# Patient Record
Sex: Female | Born: 1950 | Race: White | Hispanic: No | Smoking: Never smoker
Health system: Southern US, Community
[De-identification: ages and names within clinical notes are randomized; demographics above are authoritative.]

## PROBLEM LIST (undated history)

## (undated) DIAGNOSIS — N200 Calculus of kidney: Secondary | ICD-10-CM

## (undated) DIAGNOSIS — J189 Pneumonia, unspecified organism: Secondary | ICD-10-CM

## (undated) DIAGNOSIS — K635 Polyp of colon: Secondary | ICD-10-CM

## (undated) DIAGNOSIS — G709 Myoneural disorder, unspecified: Secondary | ICD-10-CM

## (undated) DIAGNOSIS — E785 Hyperlipidemia, unspecified: Secondary | ICD-10-CM

## (undated) DIAGNOSIS — M1990 Unspecified osteoarthritis, unspecified site: Secondary | ICD-10-CM

## (undated) DIAGNOSIS — I10 Essential (primary) hypertension: Secondary | ICD-10-CM

## (undated) DIAGNOSIS — C50919 Malignant neoplasm of unspecified site of unspecified female breast: Secondary | ICD-10-CM

## (undated) DIAGNOSIS — Z923 Personal history of irradiation: Secondary | ICD-10-CM

## (undated) DIAGNOSIS — Z9221 Personal history of antineoplastic chemotherapy: Secondary | ICD-10-CM

## (undated) HISTORY — DX: Myoneural disorder, unspecified: G70.9

## (undated) HISTORY — PX: MASTECTOMY: SHX3

## (undated) HISTORY — DX: Malignant neoplasm of unspecified site of unspecified female breast: C50.919

## (undated) HISTORY — DX: Hyperlipidemia, unspecified: E78.5

## (undated) HISTORY — DX: Calculus of kidney: N20.0

## (undated) HISTORY — DX: Pneumonia, unspecified organism: J18.9

## (undated) HISTORY — DX: Polyp of colon: K63.5

## (undated) HISTORY — PX: ABDOMINAL HYSTERECTOMY: SHX81

## (undated) HISTORY — DX: Unspecified osteoarthritis, unspecified site: M19.90

## (undated) HISTORY — DX: Essential (primary) hypertension: I10

---

## 1984-07-15 HISTORY — PX: PARTIAL HYSTERECTOMY: SHX80

## 1984-07-15 HISTORY — PX: ABDOMINAL HYSTERECTOMY: SHX81

## 1989-07-15 HISTORY — PX: OTHER SURGICAL HISTORY: SHX169

## 1997-07-15 HISTORY — PX: BREAST LUMPECTOMY: SHX2

## 1998-05-03 ENCOUNTER — Ambulatory Visit (HOSPITAL_BASED_OUTPATIENT_CLINIC_OR_DEPARTMENT_OTHER): Admission: RE | Admit: 1998-05-03 | Discharge: 1998-05-03 | Payer: Self-pay | Admitting: General Surgery

## 1998-05-10 ENCOUNTER — Encounter: Payer: Self-pay | Admitting: General Surgery

## 1998-05-11 ENCOUNTER — Encounter: Payer: Self-pay | Admitting: General Surgery

## 1998-05-11 ENCOUNTER — Ambulatory Visit (HOSPITAL_COMMUNITY): Admission: RE | Admit: 1998-05-11 | Discharge: 1998-05-11 | Payer: Self-pay | Admitting: General Surgery

## 1998-05-23 ENCOUNTER — Ambulatory Visit (HOSPITAL_BASED_OUTPATIENT_CLINIC_OR_DEPARTMENT_OTHER): Admission: RE | Admit: 1998-05-23 | Discharge: 1998-05-23 | Payer: Self-pay | Admitting: General Surgery

## 1998-06-15 ENCOUNTER — Encounter: Payer: Self-pay | Admitting: General Surgery

## 1998-06-15 ENCOUNTER — Ambulatory Visit (HOSPITAL_COMMUNITY): Admission: RE | Admit: 1998-06-15 | Discharge: 1998-06-15 | Payer: Self-pay | Admitting: General Surgery

## 1998-10-18 ENCOUNTER — Encounter: Admission: RE | Admit: 1998-10-18 | Discharge: 1999-01-16 | Payer: Self-pay | Admitting: Radiation Oncology

## 1999-01-18 ENCOUNTER — Encounter: Admission: RE | Admit: 1999-01-18 | Discharge: 1999-04-18 | Payer: Self-pay | Admitting: Radiation Oncology

## 1999-04-09 ENCOUNTER — Other Ambulatory Visit: Admission: RE | Admit: 1999-04-09 | Discharge: 1999-04-09 | Payer: Self-pay | Admitting: Obstetrics and Gynecology

## 1999-06-22 ENCOUNTER — Encounter: Admission: RE | Admit: 1999-06-22 | Discharge: 1999-06-22 | Payer: Self-pay | Admitting: General Surgery

## 1999-06-22 ENCOUNTER — Encounter: Payer: Self-pay | Admitting: General Surgery

## 1999-07-27 ENCOUNTER — Other Ambulatory Visit: Admission: RE | Admit: 1999-07-27 | Discharge: 1999-07-27 | Payer: Self-pay | Admitting: Internal Medicine

## 2000-04-15 ENCOUNTER — Encounter: Admission: RE | Admit: 2000-04-15 | Discharge: 2000-04-15 | Payer: Self-pay | Admitting: Oncology

## 2000-04-15 ENCOUNTER — Encounter (HOSPITAL_COMMUNITY): Payer: Self-pay | Admitting: Oncology

## 2000-06-23 ENCOUNTER — Encounter (HOSPITAL_COMMUNITY): Payer: Self-pay | Admitting: Oncology

## 2000-06-23 ENCOUNTER — Encounter: Admission: RE | Admit: 2000-06-23 | Discharge: 2000-06-23 | Payer: Self-pay | Admitting: Oncology

## 2000-10-27 ENCOUNTER — Other Ambulatory Visit: Admission: RE | Admit: 2000-10-27 | Discharge: 2000-10-27 | Payer: Self-pay | Admitting: Obstetrics and Gynecology

## 2001-02-09 ENCOUNTER — Ambulatory Visit (HOSPITAL_COMMUNITY): Admission: RE | Admit: 2001-02-09 | Discharge: 2001-02-09 | Payer: Self-pay | Admitting: Pulmonary Disease

## 2001-02-11 ENCOUNTER — Ambulatory Visit (HOSPITAL_COMMUNITY): Admission: RE | Admit: 2001-02-11 | Discharge: 2001-02-11 | Payer: Self-pay | Admitting: Pulmonary Disease

## 2001-03-06 ENCOUNTER — Encounter: Admission: RE | Admit: 2001-03-06 | Discharge: 2001-03-06 | Payer: Self-pay | Admitting: Oncology

## 2001-03-06 ENCOUNTER — Encounter (HOSPITAL_COMMUNITY): Admission: RE | Admit: 2001-03-06 | Discharge: 2001-04-05 | Payer: Self-pay | Admitting: Oncology

## 2001-06-24 ENCOUNTER — Encounter (HOSPITAL_COMMUNITY): Admission: RE | Admit: 2001-06-24 | Discharge: 2001-07-24 | Payer: Self-pay | Admitting: Oncology

## 2001-06-26 ENCOUNTER — Encounter: Admission: RE | Admit: 2001-06-26 | Discharge: 2001-06-26 | Payer: Self-pay | Admitting: Oncology

## 2001-06-26 ENCOUNTER — Encounter (HOSPITAL_COMMUNITY): Payer: Self-pay | Admitting: Oncology

## 2001-08-24 ENCOUNTER — Encounter (HOSPITAL_COMMUNITY): Admission: RE | Admit: 2001-08-24 | Discharge: 2001-09-23 | Payer: Self-pay | Admitting: Oncology

## 2002-02-22 ENCOUNTER — Encounter (HOSPITAL_COMMUNITY): Admission: RE | Admit: 2002-02-22 | Discharge: 2002-03-24 | Payer: Self-pay | Admitting: Oncology

## 2002-02-22 ENCOUNTER — Encounter: Admission: RE | Admit: 2002-02-22 | Discharge: 2002-02-22 | Payer: Self-pay | Admitting: Oncology

## 2002-08-23 ENCOUNTER — Encounter: Admission: RE | Admit: 2002-08-23 | Discharge: 2002-08-23 | Payer: Self-pay | Admitting: Oncology

## 2002-08-23 ENCOUNTER — Encounter (HOSPITAL_COMMUNITY): Payer: Self-pay | Admitting: Oncology

## 2002-08-23 ENCOUNTER — Encounter (HOSPITAL_COMMUNITY): Admission: RE | Admit: 2002-08-23 | Discharge: 2002-09-22 | Payer: Self-pay | Admitting: Oncology

## 2012-07-15 HISTORY — PX: BREAST SURGERY: SHX581

## 2012-10-06 ENCOUNTER — Telehealth (HOSPITAL_COMMUNITY): Payer: Self-pay | Admitting: *Deleted

## 2012-10-06 NOTE — Telephone Encounter (Signed)
Patient had lobular ca in 2000, seen by Dr. Mariel Sleet, chemo and radiation. She now lives in [place]. 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 Has seen surgeon who is discussing mastectomy and IROT(20 grey of radiation during surgery before closing surgical site. Would like Dr. Thornton Papas thoughts on treatment in her situation.Cheyenne Sanders phone is [phone] Home--[phone]

## 2014-07-15 HISTORY — PX: BREAST BIOPSY: SHX20

## 2015-06-28 DIAGNOSIS — R072 Precordial pain: Secondary | ICD-10-CM | POA: Insufficient documentation

## 2015-06-28 DIAGNOSIS — I10 Essential (primary) hypertension: Secondary | ICD-10-CM | POA: Insufficient documentation

## 2015-06-28 DIAGNOSIS — I2510 Atherosclerotic heart disease of native coronary artery without angina pectoris: Secondary | ICD-10-CM

## 2015-06-28 DIAGNOSIS — E782 Mixed hyperlipidemia: Secondary | ICD-10-CM | POA: Insufficient documentation

## 2015-06-28 HISTORY — DX: Precordial pain: R07.2

## 2015-06-28 HISTORY — DX: Mixed hyperlipidemia: E78.2

## 2015-06-28 HISTORY — DX: Atherosclerotic heart disease of native coronary artery without angina pectoris: I25.10

## 2016-07-25 ENCOUNTER — Ambulatory Visit (INDEPENDENT_AMBULATORY_CARE_PROVIDER_SITE_OTHER): Payer: BLUE CROSS/BLUE SHIELD | Admitting: Family Medicine

## 2016-07-25 VITALS — BP 140/86 | HR 73 | Temp 98.4°F | Resp 18 | Ht 64.5 in | Wt 123.0 lb

## 2016-07-25 DIAGNOSIS — J0120 Acute ethmoidal sinusitis, unspecified: Secondary | ICD-10-CM

## 2016-07-25 DIAGNOSIS — R062 Wheezing: Secondary | ICD-10-CM | POA: Diagnosis not present

## 2016-07-25 MED ORDER — AZITHROMYCIN 250 MG PO TABS: ORAL_TABLET | ORAL | 0 refills | Status: DC

## 2016-07-25 MED ORDER — ALBUTEROL SULFATE HFA 108 (90 BASE) MCG/ACT IN AERS: 2.0000 | INHALATION_SPRAY | Freq: Four times a day (QID) | RESPIRATORY_TRACT | 0 refills | Status: DC | PRN

## 2016-07-25 NOTE — Patient Instructions (Addendum)
For the azithromycin, take 2 pills today and then 1 pill daily after that.    Use the inhaler 2 puff before bed and again if you have trouble breathing.  You should be better in the next few days.  It was good to meet you today!     IF you received an x-ray today, you will receive an invoice from [facility]. Please contact [facility] at [phone] with questions or concerns regarding your invoice.   IF you received labwork today, you will receive an invoice from [facility]. Please contact [facility] at [phone] with questions or concerns regarding your invoice.   Our billing staff will not be able to assist you with questions regarding bills from these companies.  You will be contacted with the lab results as soon as they are available. The fastest way to get your results is to activate your My Chart account. Instructions are located on the last page of this paperwork. If you have not heard from [place] regarding the results in 2 weeks, please contact this office.

## 2016-07-25 NOTE — Progress Notes (Signed)
   Cheyenne Sanders is a 66 y.o. female who presents to [facility] today for URI and wheezing:  1.  SUBJECTIVE:  Cheyenne Sanders is a 66 y.o. female who sinus congestion, cough, rhinorrhea for the past week. Initially got better but then Sunday started worsening again. No chest pain. Cough is nonproductive and dry and hacking. She has had some subjective chills at home but no fevers. No history of smoking cigarettes in the past. Extremity edema.  Had some wheezing last night and difficulty catching her breath. She had difficulty lying flat after this. This resolved this morning. She has been using Mucinex with some relief of her cough. She has also been using phenylephrine for some sinus congestion.  ROS as above.     PMH reviewed. Patient is a nonsmoker.   No past medical history on file. No past surgical history on file.  Medications reviewed. Current Outpatient Prescriptions  Medication Sig Dispense Refill  . GuaiFENesin (MUCUS RELIEF ADULT PO) Take by mouth.    . phenylephrine (SUDAFED PE) 10 MG TABS tablet Take 10 mg by mouth every 4 (four) hours as needed.     No current facility-administered medications for this visit.      Physical Exam:  BP 140/86   Pulse 73   Temp 98.4 F (36.9 C) (Oral)   Resp 18   Ht 5' 4.5" (1.638 m)   Wt 123 lb (55.8 kg)   SpO2 98%   BMI 20.79 kg/m  Gen:  Patient sitting on exam table, appears stated age in no acute distress Head: Normocephalic atraumatic Eyes: EOMI, PERRL, sclera and conjunctiva non-erythematous Ears:  Canals clear bilaterally.  TMs pearly gray bilaterally without erythema or bulging.   Nose:  Nasal turbinates grossly enlarged bilaterally. Some exudates noted. Tender to palpation of maxillary sinus  Mouth: Mucosa membranes moist. Tonsils +2, nonenlarged, non-erythematous. Neck: No cervical lymphadenopathy noted Heart:  RRR, no murmurs auscultated. Pulm:  Some mild wheezes noted at BL lower bases.   Persists after coughing.  Assessment and Plan:  1.  SInusitis with cough: - treating With azithromycin. -Sounds like second 16. She initially started getting better last week and then worsened starting on Monday. No evidence of influenza. -She has had some wheezing but has no history of asthma. We discussed several different treatments for this. She will continue her Mucinex as this helps with her cough some. I also sent in an inhaler for her as she is concerned that her wheezing and dyspnea will recur. - FU if no improvement in next 5 - 7 days.

## 2016-08-27 ENCOUNTER — Ambulatory Visit (INDEPENDENT_AMBULATORY_CARE_PROVIDER_SITE_OTHER): Payer: BLUE CROSS/BLUE SHIELD

## 2016-08-27 ENCOUNTER — Ambulatory Visit (INDEPENDENT_AMBULATORY_CARE_PROVIDER_SITE_OTHER): Payer: BLUE CROSS/BLUE SHIELD | Admitting: Family Medicine

## 2016-08-27 ENCOUNTER — Encounter: Payer: Self-pay | Admitting: Family Medicine

## 2016-08-27 VITALS — BP 148/89 | HR 65 | Temp 97.9°F | Ht 64.5 in | Wt 124.4 lb

## 2016-08-27 DIAGNOSIS — N200 Calculus of kidney: Secondary | ICD-10-CM | POA: Diagnosis not present

## 2016-08-27 DIAGNOSIS — R109 Unspecified abdominal pain: Secondary | ICD-10-CM | POA: Diagnosis not present

## 2016-08-27 DIAGNOSIS — Z853 Personal history of malignant neoplasm of breast: Secondary | ICD-10-CM

## 2016-08-27 DIAGNOSIS — R10A1 Flank pain, right side: Secondary | ICD-10-CM

## 2016-08-27 HISTORY — DX: Calculus of kidney: N20.0

## 2016-08-27 LAB — POCT URINALYSIS DIP (MANUAL ENTRY)
Bilirubin, UA: NEGATIVE
Glucose, UA: NEGATIVE
Ketones, POC UA: NEGATIVE
Nitrite, UA: POSITIVE — AB
Protein Ur, POC: NEGATIVE
Spec Grav, UA: 1.02
Urobilinogen, UA: 0.2
pH, UA: 5.5

## 2016-08-27 IMAGING — DX DG RIBS W/ CHEST 3+V*R*
4 series · 4 of 4 positions shown · non-contrast
Comparison: Chest x-ray report [DATE] no images available

CLINICAL DATA: Right flank pain, history of recurrent right breast
cancer

EXAM:
RIGHT RIBS AND CHEST - 3+ VIEW

[chest pa]
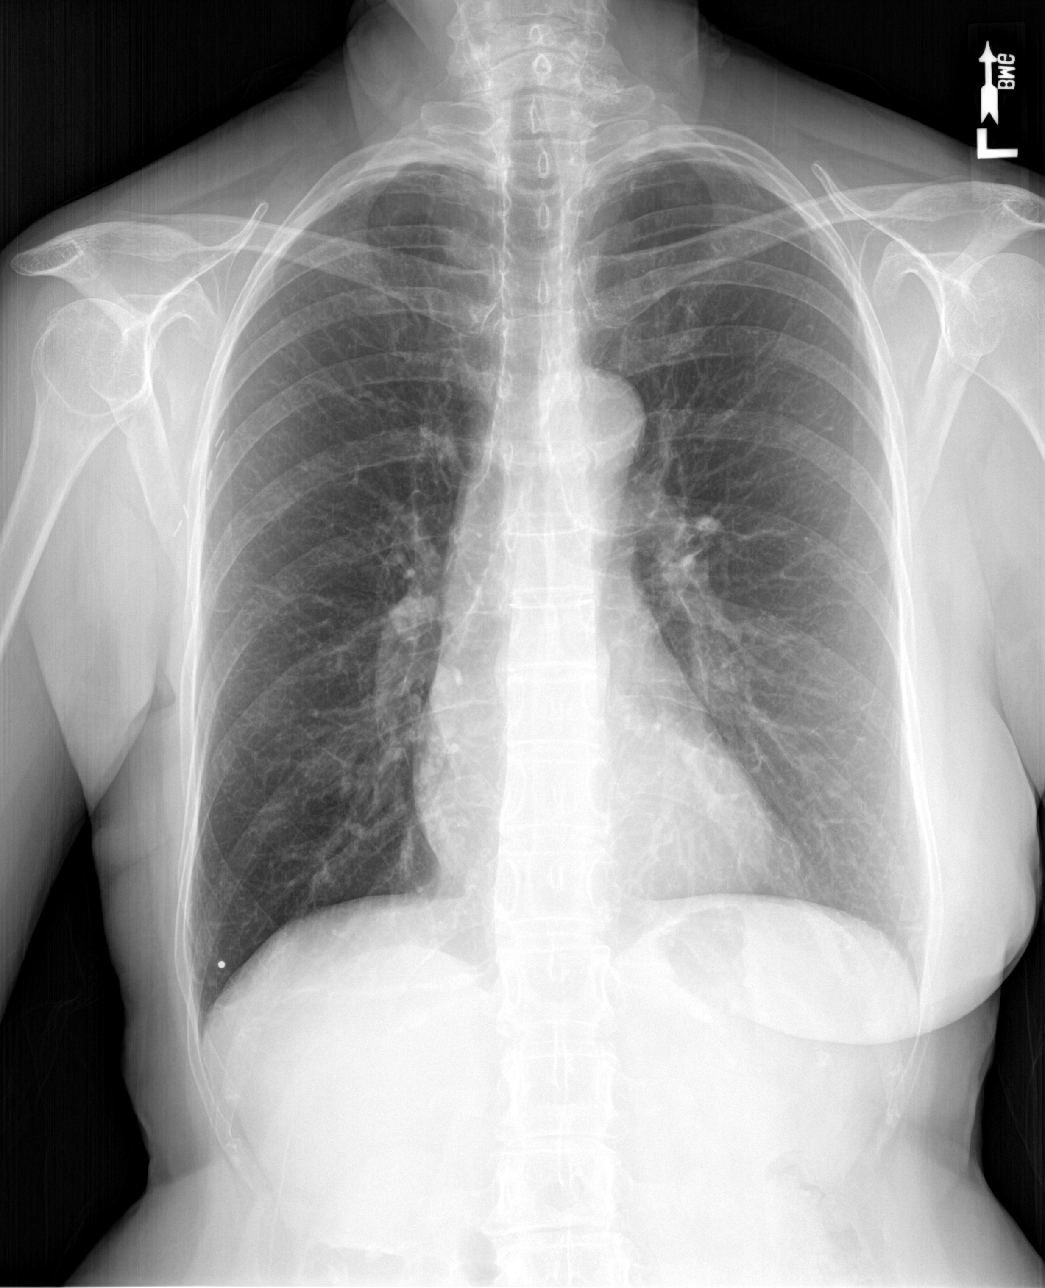

[rib obl (1 of 2)]
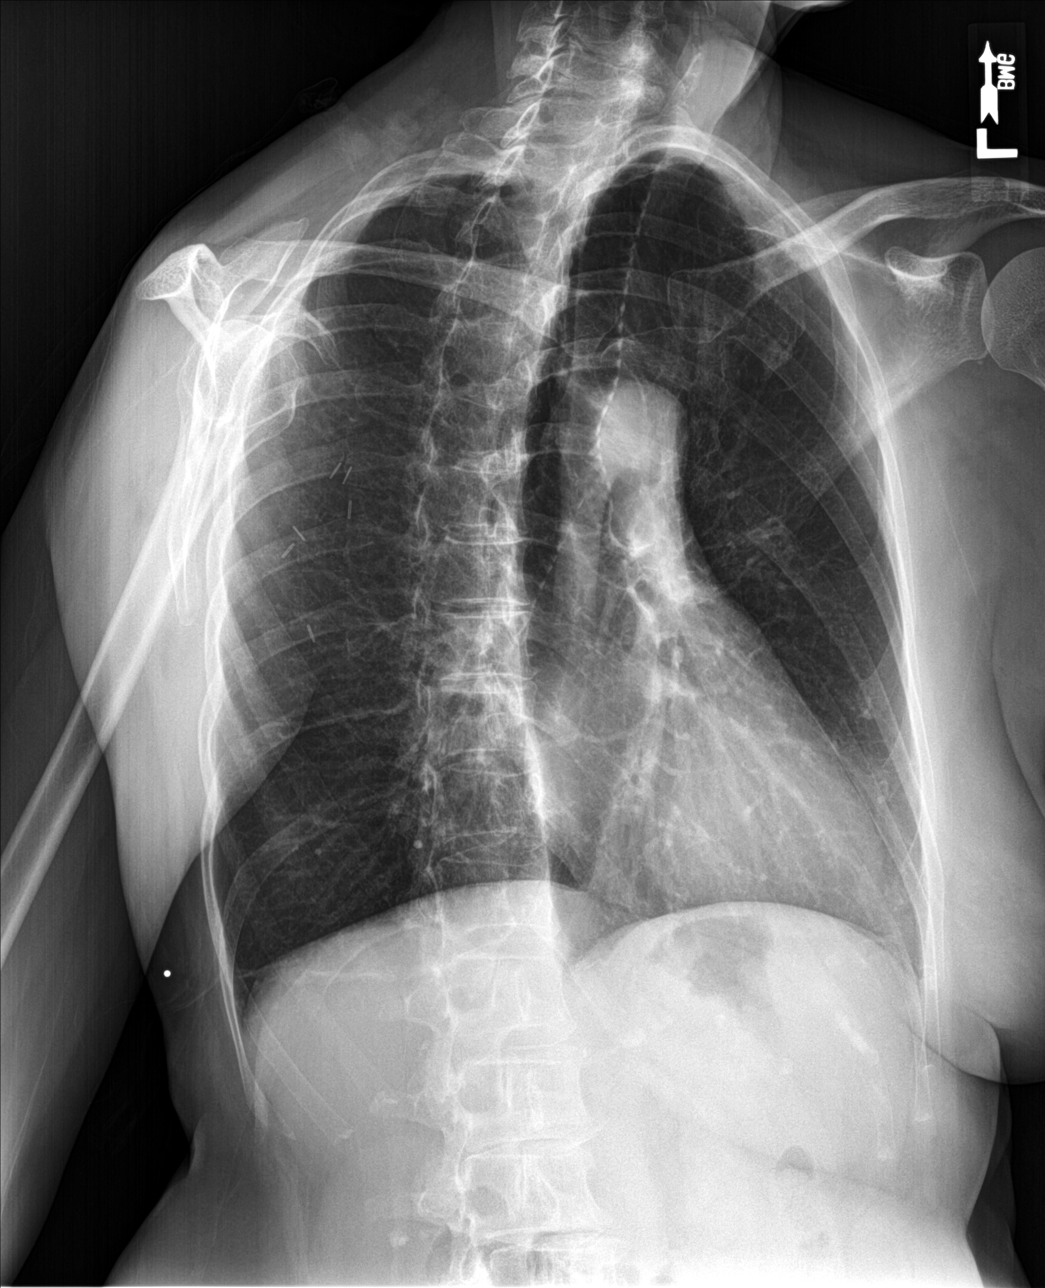

[rib obl (2 of 2)]
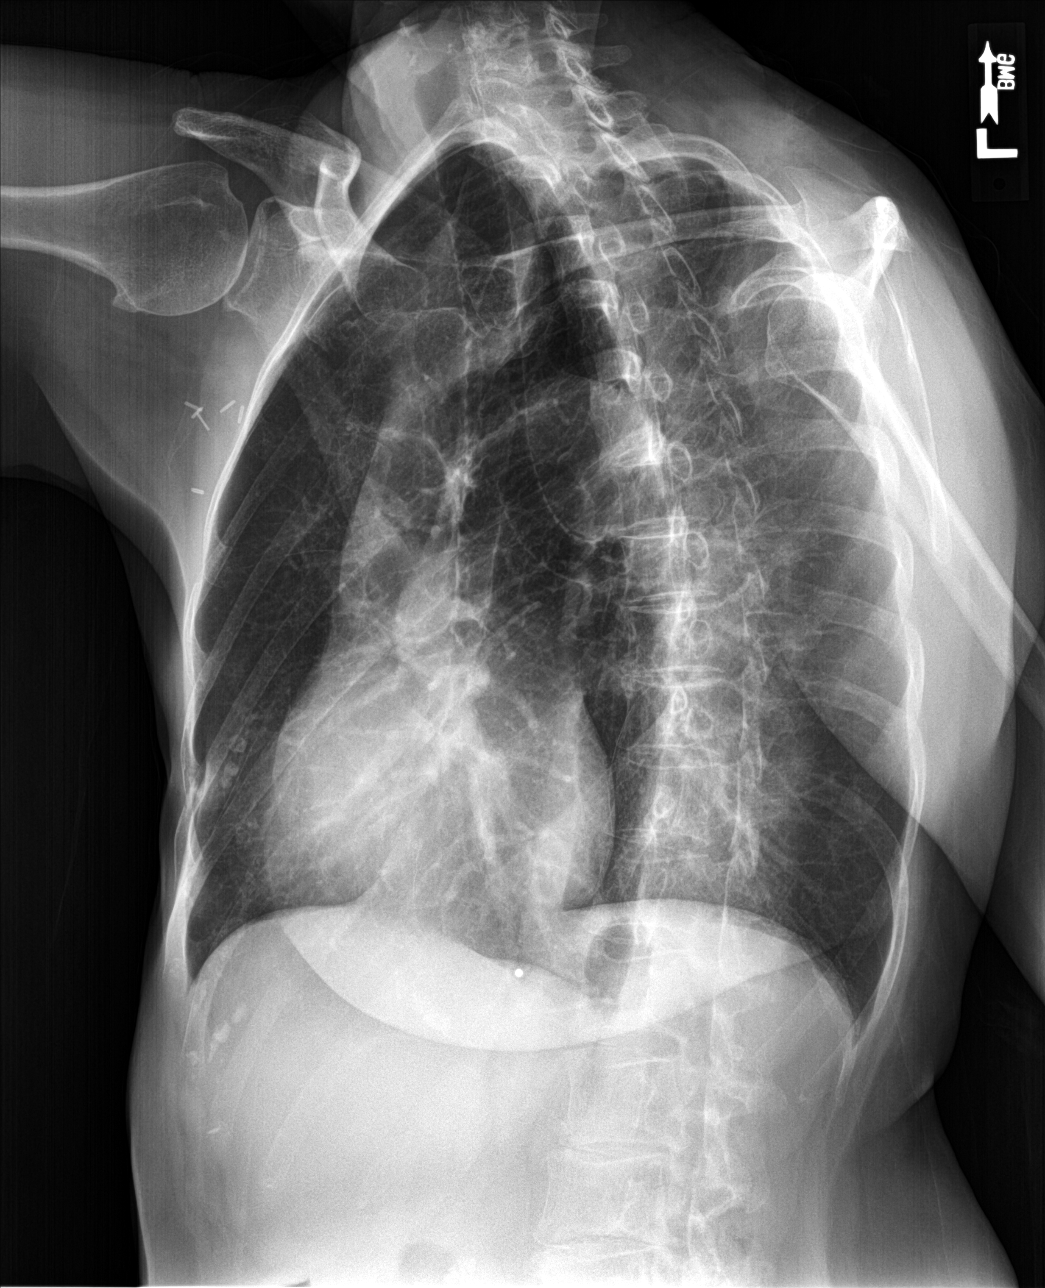

[chest ap]
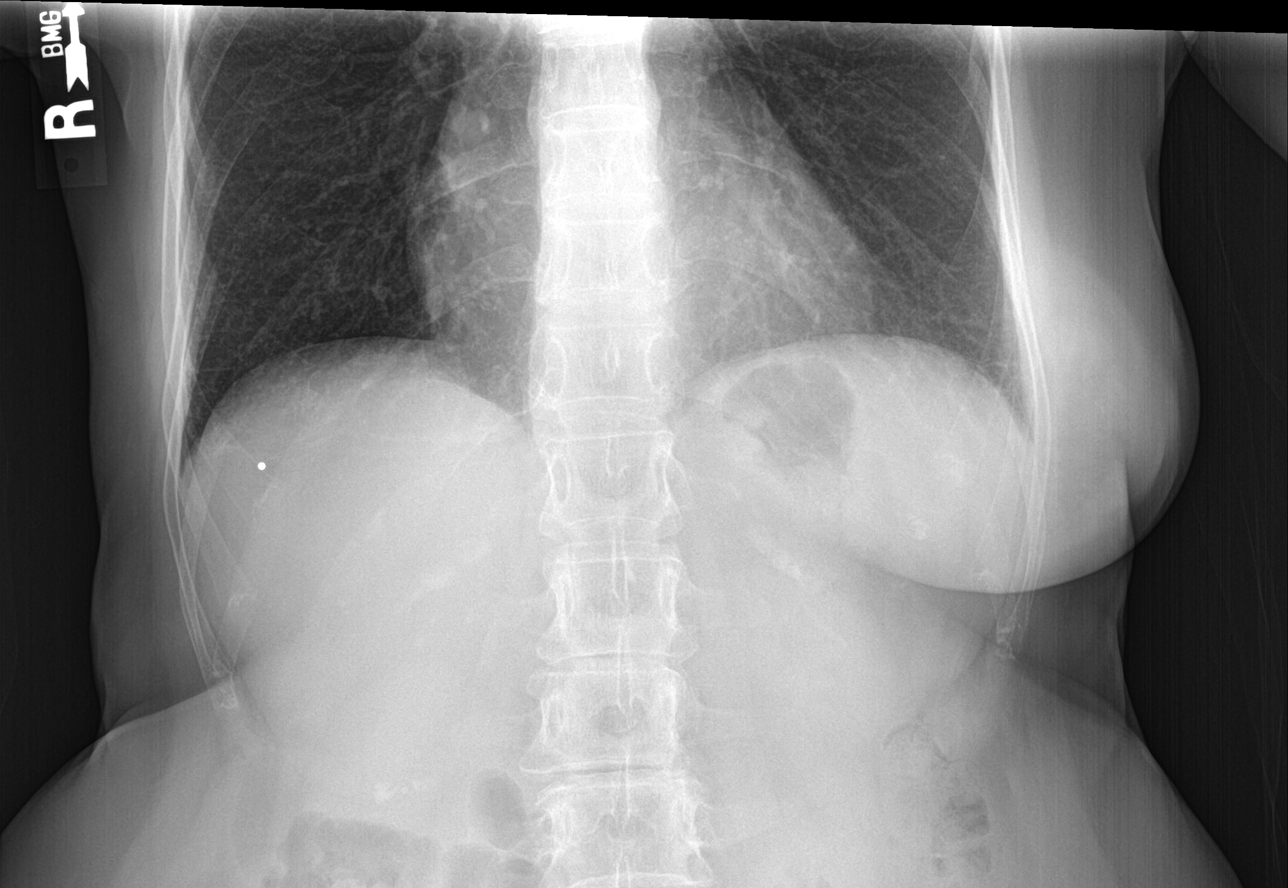

[4 of 4 positions shown; findings below may reference images not displayed]

FINDINGS: Four views right ribs submitted for interpretation.
Cardiomediastinal silhouette is unremarkable. No infiltrate or
pulmonary edema. Surgical clips are noted in right axilla. No right
rib fracture is identified. No destructive bony lesion. There is no
pneumothorax.
IMPRESSION: No infiltrate or pulmonary edema. Surgical clips are noted in right
axilla. No right rib fracture is identified. No pneumothorax.

## 2016-08-27 NOTE — Progress Notes (Signed)
Subjective:    Patient ID: Cheyenne Sanders, female    DOB: July 13, 1951, 66 y.o.   MRN: [id number]  08/27/2016  Back Pain (X 2-3 mth- right side)   HPI This 66 y.o. female presents for evaluation of back pain for three months on R side.   History of neophrolithiasis in 2016.  Having R sided thoracic region pain. Not hsarp but can be dull.  Taking deep breath with sensaiton that makes cough. Intermittent; onset 3 months ago; resolved and now returned.  Daily pain on R isde.  Getting really sore in car while riding.    Lives in [place].  Has moved back in here.  Would like to get a good urologist; son recommends Dr. Al Corpus at [facility].   In March, performed xray; Dr. Juleen China in [place].  Has R sided kidney stones per Dr. Arletha Pili urologist in [facility]; last visit in 09/2015.   Multiple non-obstructing R renal stones -- 88mm R lower pole, 1 mm R interpolar, and R upper pole; largest 6-7 mm with 26mm and 4 mm adjacent stones.  Recurrent UTI x 2 in past year.    Last visit with uorlogist in [place] in 05/2016.  History of R breast cancer x 2 1999 and 2014; lats visit with oncology one year ago in [place], [place].  Worried about pain on R being cancer.  Husband died with pancreatic cancer; presented and underwent CT scan that was normal; diagnosed six months later; died with pancreatic cancer with mets in 2014 at same time that husband diagnosed with pancreatic cancer.    Review of Systems  Constitutional: Negative for chills, diaphoresis, fatigue and fever.  HENT: Negative for ear pain, postnasal drip, rhinorrhea, sinus pressure, sore throat and trouble swallowing.   Respiratory: Negative for cough and shortness of breath.   Cardiovascular: Negative for chest pain, palpitations and leg swelling.  Gastrointestinal: Negative for abdominal pain, constipation, diarrhea, nausea and vomiting.  Genitourinary: Negative for decreased urine volume, dysuria, flank pain, frequency, genital sores,  hematuria, pelvic pain, urgency, vaginal bleeding, vaginal discharge and vaginal pain.  Musculoskeletal: Positive for back pain.    Past Medical History:  Diagnosis Date  . Breast cancer ([place]) [id number]   R breast cancer x 2; lumpectomy with radiation, chemotherapy; mastectomy R.   Past Surgical History:  Procedure Laterality Date  . ABDOMINAL HYSTERECTOMY  1986  . BREAST SURGERY Right 2014   No Known Allergies Current Outpatient Prescriptions  Medication Sig Dispense Refill  . albuterol (PROVENTIL HFA;VENTOLIN HFA) 108 (90 Base) MCG/ACT inhaler Inhale 2 puffs into the lungs every 6 (six) hours as needed for wheezing or shortness of breath. (Patient not taking: Reported on 08/27/2016) 1 Inhaler 0  . azithromycin (ZITHROMAX) 250 MG tablet Take 2 pills today and then 1 pill daily after that. (Patient not taking: Reported on 08/27/2016) 6 tablet 0  . GuaiFENesin (MUCUS RELIEF ADULT PO) Take by mouth.    . phenylephrine (SUDAFED PE) 10 MG TABS tablet Take 10 mg by mouth every 4 (four) hours as needed.     No current facility-administered medications for this visit.    Social History   Social History  . Marital status: Married    Spouse name: N/A  . Number of children: N/A  . Years of education: N/A   Occupational History  . Not on file.   Social History Main Topics  . Smoking status: Never Smoker  . Smokeless tobacco: Never Used  . Alcohol use 0.6 oz/week  1 Glasses of wine per week  . Drug use: No  . Sexual activity: Not on file   Other Topics Concern  . Not on file   Social History Narrative  . No narrative on file   Family History  Problem Relation Age of Onset  . Heart disease Mother   . Cancer Father        Objective:    BP (!) 148/89 (BP Location: Right Arm, Patient Position: Sitting, Cuff Size: Small)   Pulse 65   Temp 97.9 F (36.6 C) (Oral)   Ht 5' 4.5" (1.638 m)   Wt 124 lb 6.4 oz (56.4 kg)   SpO2 98%   BMI 21.02 kg/m  Physical Exam    Constitutional: She is oriented to person, place, and time. She appears well-developed and well-nourished. No distress.  HENT:  Head: Normocephalic and atraumatic.  Right Ear: External ear normal.  Left Ear: External ear normal.  Nose: Nose normal.  Mouth/Throat: Oropharynx is clear and moist.  Eyes: Conjunctivae and EOM are normal. Pupils are equal, round, and reactive to light.  Neck: Normal range of motion. Neck supple. Carotid bruit is not present. No thyromegaly present.  Cardiovascular: Normal rate, regular rhythm, normal heart sounds and intact distal pulses.  Exam reveals no gallop and no friction rub.   No murmur heard. Pulmonary/Chest: Effort normal and breath sounds normal. She has no wheezes. She has no rales.  Abdominal: Soft. Bowel sounds are normal. She exhibits no distension and no mass. There is no tenderness. There is no rebound and no guarding.  Musculoskeletal:       Cervical back: Normal.       Thoracic back: Normal. She exhibits normal range of motion, no tenderness, no pain and no spasm.       Lumbar back: Normal. She exhibits normal range of motion, no tenderness, no bony tenderness, no pain, no spasm and normal pulse.  Lymphadenopathy:    She has no cervical adenopathy.  Neurological: She is alert and oriented to person, place, and time. No cranial nerve deficit.  Skin: Skin is warm and dry. No rash noted. She is not diaphoretic. No erythema. No pallor.  Psychiatric: She has a normal mood and affect. Her behavior is normal.  Nursing note and vitals reviewed.       Depression screen [facility] 2/9 08/27/2016 07/25/2016  Decreased Interest 0 0  Down, Depressed, Hopeless 0 0  PHQ - 2 Score 0 0   Fall Risk  08/27/2016 07/25/2016  Falls in the past year? No No    Assessment & Plan:   1. Right flank pain   2. Nephrolithiasis   3. History of breast cancer    -New onset; obtain R rib films; obtain labs; refer to urology due to known nephrolithiasis. -also needs to  establish with oncology due to history of breast cancer; referral placed.    Orders Placed This Encounter  Procedures  . DG Ribs Unilateral W/Chest Right    Standing Status:   Future    Number of Occurrences:   1    Standing Expiration Date:   08/27/2017    Order Specific Question:   Reason for Exam (SYMPTOM  OR DIAGNOSIS REQUIRED)    Answer:   R flank pain; history of recurrent R breast cancer    Order Specific Question:   Preferred imaging location?    Answer:   External  . CBC with Differential/Platelet  . Comprehensive metabolic panel  . Ambulatory referral to  Urology    Referral Priority:   Routine    Referral Type:   Consultation    Referral Reason:   Specialty Services Required    Requested Specialty:   Urology    Number of Visits Requested:   1  . Ambulatory referral to Oncology    Referral Priority:   Routine    Referral Type:   Consultation    Referral Reason:   Specialty Services Required    Number of Visits Requested:   1  . POCT urinalysis dipstick   No orders of the defined types were placed in this encounter.   No Follow-up on file.   Laretha Luepke Elayne Guerin, M.D. [facility] previously [facility] [address] [place], [place]  [phone] phone [phone] fax

## 2016-08-27 NOTE — Patient Instructions (Addendum)
   IF you received an x-ray today, you will receive an invoice from [facility]. Please contact [facility] at [phone] with questions or concerns regarding your invoice.   IF you received labwork today, you will receive an invoice from [facility]. Please contact [facility] at [phone] with questions or concerns regarding your invoice.   Our billing staff will not be able to assist you with questions regarding bills from these companies.  You will be contacted with the lab results as soon as they are available. The fastest way to get your results is to activate your My Chart account. Instructions are located on the last page of this paperwork. If you have not heard from us regarding the results in 2 weeks, please contact this office.     Low Back Sprain Rehab Ask your health care provider which exercises are safe for you. Do exercises exactly as told by your health care provider and adjust them as directed. It is normal to feel mild stretching, pulling, tightness, or discomfort as you do these exercises, but you should stop right away if you feel sudden pain or your pain gets worse. Do not begin these exercises until told by your health care provider. Stretching and range of motion exercises These exercises warm up your muscles and joints and improve the movement and flexibility of your back. These exercises also help to relieve pain, numbness, and tingling. Exercise A: Lumbar rotation   1. Lie on your back on a firm surface and bend your knees. 2. Straighten your arms out to your sides so each arm forms an "L" shape with a side of your body (a 90 degree angle). 3. Slowly move both of your knees to one side of your body until you feel a stretch in your lower back. Try not to let your shoulders move off of the floor. 4. Hold for __________ seconds. 5. Tense your abdominal muscles and slowly move your knees back to the starting position. 6. Repeat this exercise on the  other side of your body. Repeat __________ times. Complete this exercise __________ times a day. Exercise B: Prone extension on elbows   1. Lie on your abdomen on a firm surface. 2. Prop yourself up on your elbows. 3. Use your arms to help lift your chest up until you feel a gentle stretch in your abdomen and your lower back.  This will place some of your body weight on your elbows. If this is uncomfortable, try stacking pillows under your chest.  Your hips should stay down, against the surface that you are lying on. Keep your hip and back muscles relaxed. 4. Hold for __________ seconds. 5. Slowly relax your upper body and return to the starting position. Repeat __________ times. Complete this exercise __________ times a day. Strengthening exercises These exercises build strength and endurance in your back. Endurance is the ability to use your muscles for a long time, even after they get tired. Exercise C: Pelvic tilt  1. Lie on your back on a firm surface. Bend your knees and keep your feet flat. 2. Tense your abdominal muscles. Tip your pelvis up toward the ceiling and flatten your lower back into the floor.  To help with this exercise, you may place a small towel under your lower back and try to push your back into the towel. 3. Hold for __________ seconds. 4. Let your muscles relax completely before you repeat this exercise. Repeat __________ times. Complete this exercise __________ times a day. Exercise   D: Alternating arm and leg raises   1. Get on your hands and knees on a firm surface. If you are on a hard floor, you may want to use padding to cushion your knees, such as an exercise mat. 2. Line up your arms and legs. Your hands should be below your shoulders, and your knees should be below your hips. 3. Lift your left leg behind you. At the same time, raise your right arm and straighten it in front of you.  Do not lift your leg higher than your hip.  Do not lift your arm  higher than your shoulder.  Keep your abdominal and back muscles tight.  Keep your hips facing the ground.  Do not arch your back.  Keep your balance carefully, and do not hold your breath. 4. Hold for __________ seconds. 5. Slowly return to the starting position and repeat with your right leg and your left arm. Repeat __________ times. Complete this exercise __________ times a day. Exercise E: Abdominal set with straight leg raise   1. Lie on your back on a firm surface. 2. Bend one of your knees and keep your other leg straight. 3. Tense your abdominal muscles and lift your straight leg up, 4-6 inches (10-15 cm) off the ground. 4. Keep your abdominal muscles tight and hold for __________ seconds.  Do not hold your breath.  Do not arch your back. Keep it flat against the ground. 5. Keep your abdominal muscles tense as you slowly lower your leg back to the starting position. 6. Repeat with your other leg. Repeat __________ times. Complete this exercise __________ times a day. Posture and body mechanics   Body mechanics refers to the movements and positions of your body while you do your daily activities. Posture is part of body mechanics. Good posture and healthy body mechanics can help to relieve stress in your body's tissues and joints. Good posture means that your spine is in its natural S-curve position (your spine is neutral), your shoulders are pulled back slightly, and your head is not tipped forward. The following are general guidelines for applying improved posture and body mechanics to your everyday activities. Standing    When standing, keep your spine neutral and your feet about hip-width apart. Keep a slight bend in your knees. Your ears, shoulders, and hips should line up.  When you do a task in which you stand in one place for a long time, place one foot up on a stable object that is 2-4 inches (5-10 cm) high, such as a footstool. This helps keep your spine  neutral. Sitting    When sitting, keep your spine neutral and keep your feet flat on the floor. Use a footrest, if necessary, and keep your thighs parallel to the floor. Avoid rounding your shoulders, and avoid tilting your head forward.  When working at a desk or a computer, keep your desk at a height where your hands are slightly lower than your elbows. Slide your chair under your desk so you are close enough to maintain good posture.  When working at a computer, place your monitor at a height where you are looking straight ahead and you do not have to tilt your head forward or downward to look at the screen. Resting    When lying down and resting, avoid positions that are most painful for you.  If you have pain with activities such as sitting, bending, stooping, or squatting (flexion-based activities), lie in a position in which   your body does not bend very much. For example, avoid curling up on your side with your arms and knees near your chest (fetal position).  If you have pain with activities such as standing for a long time or reaching with your arms (extension-based activities), lie with your spine in a neutral position and bend your knees slightly. Try the following positions:  Lying on your side with a pillow between your knees.  Lying on your back with a pillow under your knees. Lifting    When lifting objects, keep your feet at least shoulder-width apart and tighten your abdominal muscles.  Bend your knees and hips and keep your spine neutral. It is important to lift using the strength of your legs, not your back. Do not lock your knees straight out.  Always ask for help to lift heavy or awkward objects. This information is not intended to replace advice given to you by your health care provider. Make sure you discuss any questions you have with your health care provider. Document Released: 07/01/2005 Document Revised: 03/07/2016 Document Reviewed: 04/12/2015 Elsevier  Interactive Patient Education  2017 [employer]vier Inc.  

## 2016-08-28 LAB — CBC WITH DIFFERENTIAL/PLATELET
Basophils Absolute: 0 10*3/uL (ref 0.0–0.2)
Basos: 1 %
EOS (ABSOLUTE): 0.1 10*3/uL (ref 0.0–0.4)
Eos: 2 %
Hematocrit: 47.1 % — ABNORMAL HIGH (ref 34.0–46.6)
Hemoglobin: 14.2 g/dL (ref 11.1–15.9)
Immature Grans (Abs): 0 10*3/uL (ref 0.0–0.1)
Immature Granulocytes: 0 %
Lymphocytes Absolute: 2.3 10*3/uL (ref 0.7–3.1)
Lymphs: 31 %
MCH: 30.6 pg (ref 26.6–33.0)
MCHC: 30.1 g/dL — ABNORMAL LOW (ref 31.5–35.7)
MCV: 102 fL — ABNORMAL HIGH (ref 79–97)
Monocytes Absolute: 0.5 10*3/uL (ref 0.1–0.9)
Monocytes: 7 %
Neutrophils Absolute: 4.5 10*3/uL (ref 1.4–7.0)
Neutrophils: 59 %
Platelets: 259 10*3/uL (ref 150–379)
RBC: 4.64 x10E6/uL (ref 3.77–5.28)
RDW: 14.9 % (ref 12.3–15.4)
WBC: 7.4 10*3/uL (ref 3.4–10.8)

## 2016-08-28 LAB — COMPREHENSIVE METABOLIC PANEL
ALT: 27 IU/L (ref 0–32)
AST: 21 IU/L (ref 0–40)
Albumin/Globulin Ratio: 2 (ref 1.2–2.2)
Albumin: 4.6 g/dL (ref 3.6–4.8)
Alkaline Phosphatase: 64 IU/L (ref 39–117)
BUN/Creatinine Ratio: 32 — ABNORMAL HIGH (ref 12–28)
BUN: 21 mg/dL (ref 8–27)
Bilirubin Total: 0.5 mg/dL (ref 0.0–1.2)
CO2: 24 mmol/L (ref 18–29)
Calcium: 10 mg/dL (ref 8.7–10.3)
Chloride: 102 mmol/L (ref 96–106)
Creatinine, Ser: 0.65 mg/dL (ref 0.57–1.00)
GFR calc Af Amer: 109 mL/min/{1.73_m2} (ref >59)
GFR calc non Af Amer: 95 mL/min/{1.73_m2} (ref >59)
Globulin, Total: 2.3 g/dL (ref 1.5–4.5)
Glucose: 79 mg/dL (ref 65–99)
Potassium: 4.5 mmol/L (ref 3.5–5.2)
Sodium: 144 mmol/L (ref 134–144)
Total Protein: 6.9 g/dL (ref 6.0–8.5)

## 2016-08-30 ENCOUNTER — Telehealth: Payer: Self-pay

## 2016-08-30 NOTE — Telephone Encounter (Signed)
See results and advise 

## 2016-08-30 NOTE — Telephone Encounter (Signed)
Pt wants the readings of her x-rays and labs done on 08/27/2016.  Please advise: [phone]

## 2016-08-31 ENCOUNTER — Telehealth: Payer: Self-pay

## 2016-08-31 NOTE — Telephone Encounter (Signed)
Pt is very anxious to know about the results of her xrays and lab work   [employer] number [phone]

## 2016-08-31 NOTE — Telephone Encounter (Signed)
Please advise (labs have abnormals)

## 2016-09-02 NOTE — Telephone Encounter (Signed)
Duplicate message. 

## 2016-09-02 NOTE — Telephone Encounter (Signed)
Call --- labs are all normal other than trace blood in urine which is very minimal.  Xray is also normal.

## 2016-09-03 ENCOUNTER — Encounter: Payer: Self-pay | Admitting: *Deleted

## 2016-09-03 NOTE — Telephone Encounter (Signed)
Pt would like someone to call her she has questions about her xrays  Please advise: [phone]

## 2016-09-03 NOTE — Progress Notes (Signed)
Lab letter mailed to patient [address]  [place].  Spoke with patient gave her results

## 2016-09-06 DIAGNOSIS — R109 Unspecified abdominal pain: Secondary | ICD-10-CM

## 2016-09-06 HISTORY — DX: Unspecified abdominal pain: R10.9

## 2017-03-11 ENCOUNTER — Ambulatory Visit (INDEPENDENT_AMBULATORY_CARE_PROVIDER_SITE_OTHER): Payer: BLUE CROSS/BLUE SHIELD | Admitting: Family Medicine

## 2017-03-11 ENCOUNTER — Ambulatory Visit (INDEPENDENT_AMBULATORY_CARE_PROVIDER_SITE_OTHER): Payer: BLUE CROSS/BLUE SHIELD

## 2017-03-11 ENCOUNTER — Encounter: Payer: Self-pay | Admitting: Family Medicine

## 2017-03-11 VITALS — BP 136/84 | HR 65 | Temp 97.9°F | Resp 16 | Ht 64.5 in | Wt 122.8 lb

## 2017-03-11 DIAGNOSIS — R6884 Jaw pain: Secondary | ICD-10-CM | POA: Diagnosis not present

## 2017-03-11 DIAGNOSIS — M7989 Other specified soft tissue disorders: Secondary | ICD-10-CM

## 2017-03-11 IMAGING — DX DG FINGER MIDDLE 2+V*R*
3 series · 3 of 3 positions shown · non-contrast
Comparison: No recent prior .

CLINICAL DATA: Swelling and bruising.

EXAM:
RIGHT MIDDLE FINGER 2+V

[finger ap]
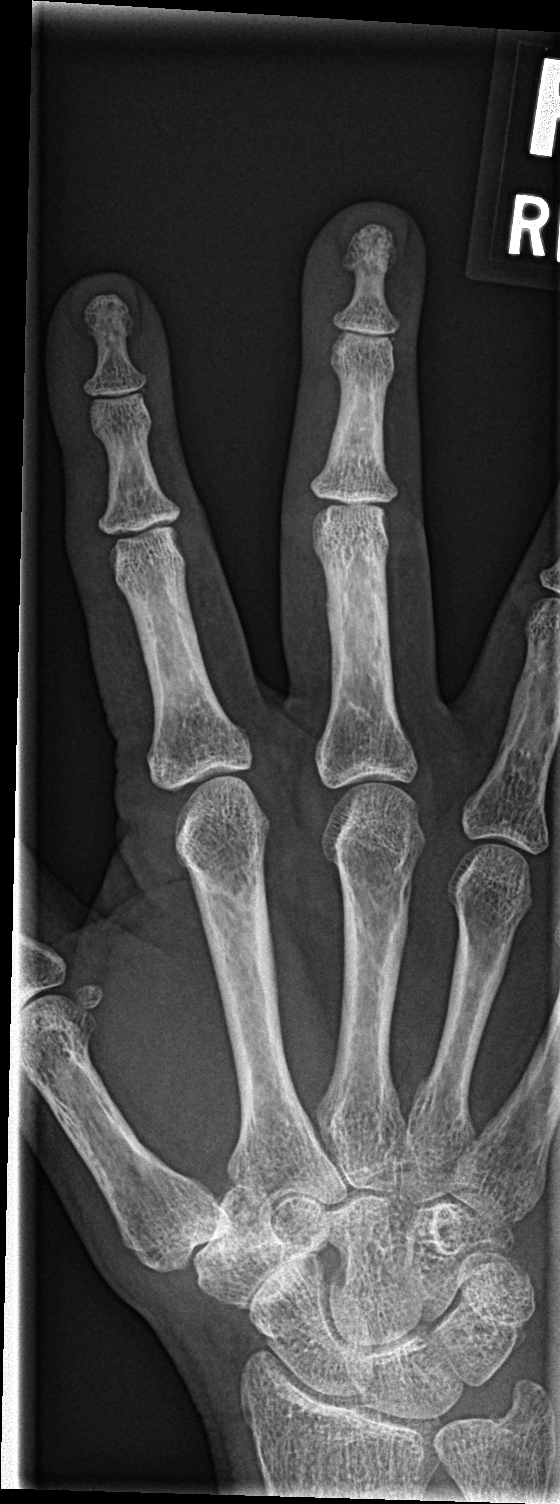

[finger obl]
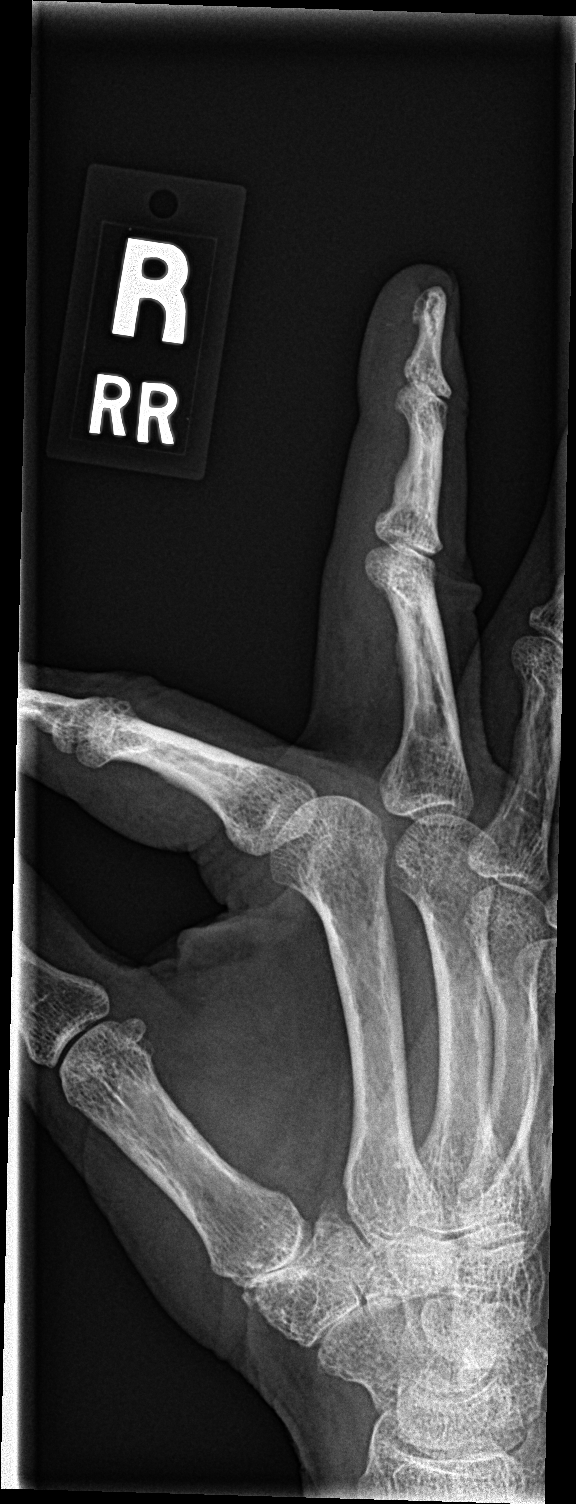

[finger lat]
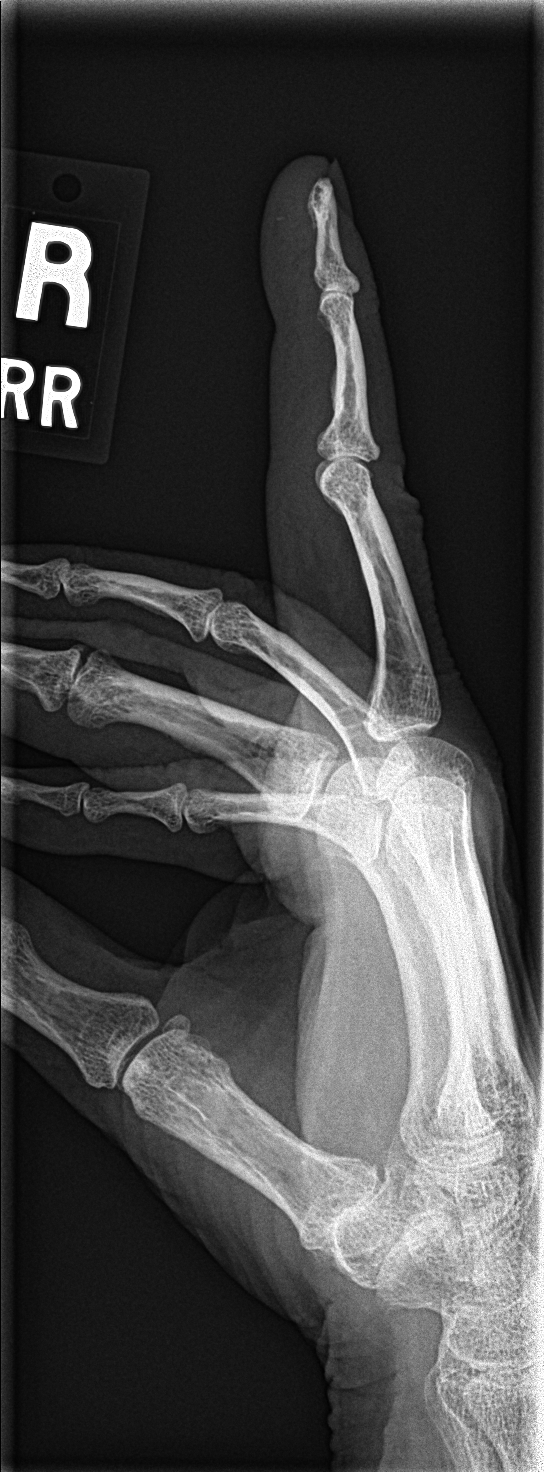

[3 of 3 positions shown; findings below may reference images not displayed]

FINDINGS: Tiny radiopaque foreign body in the soft tissues of the volar aspect
of the distal tuft of the right third digit cannot be excluded .
Subtle vertical fracture of the distal tuft of the distal phalanx of
the right third digit cannot be excluded. Diffuse degenerative
change.
IMPRESSION: 1. Tiny radiopaque foreign body in the soft tissues of the volar
aspect of the distal tuft of the right third digit cannot be
excluded.

2. Subtle vertical fracture of the distal tuft of the distal phalanx
of the right third digit cannot be excluded.

## 2017-03-11 NOTE — Progress Notes (Signed)
8/28/201811:32 AM  Cheyenne Sanders 06/18/1951, 66 y.o. female [id number]  Chief Complaint  Patient presents with  . Arm Pain    per patient, had law pain and pain in her L arm last night; subsided ater sleeping in her recliner    HPI:   Patient is a 66 y.o. female with no known cardiac history who presents today for concerns of episode that happened last night. Yesterday she was feeling a bit light headed most of the day, went to bed around 930pm. Woke up around 11pm due to left jaw pain that radiated to her ear and down her left arm. She did not have any chest pain, maybe some mild chest pressure but nothing noticeable. She denies any associated SOB, palpitations, diaphoresis or nausea, though she did wake up a bit queasy this morning . She did notice her BP 159/102. She took an aspirin and went to sleep in the recliner. This morning she woke up feeling much better, no jaw or arm pain, very minimal era pain. She denies any recent illness, denies smoking, h/o HTN or cardiovascular disease. Her mother had afib but otherwise no significant history. She reports a normal stress test about 4 years ago done in [place].  She also has noticed for past week right hand middle finger that is swollen and bruised. No sign pain, ROM normal. Denies trauma.  Depression screen [facility] 2/9 03/11/2017 08/27/2016 07/25/2016  Decreased Interest 0 0 0  Down, Depressed, Hopeless 0 0 0  PHQ - 2 Score 0 0 0    No Known Allergies  Current Outpatient Prescriptions on File Prior to Visit  Medication Sig Dispense Refill  . albuterol (PROVENTIL HFA;VENTOLIN HFA) 108 (90 Base) MCG/ACT inhaler Inhale 2 puffs into the lungs every 6 (six) hours as needed for wheezing or shortness of breath. (Patient not taking: Reported on 08/27/2016) 1 Inhaler 0  . azithromycin (ZITHROMAX) 250 MG tablet Take 2 pills today and then 1 pill daily after that. (Patient not taking: Reported on 08/27/2016) 6 tablet 0  . GuaiFENesin (MUCUS RELIEF  ADULT PO) Take by mouth.    . phenylephrine (SUDAFED PE) 10 MG TABS tablet Take 10 mg by mouth every 4 (four) hours as needed.     No current facility-administered medications on file prior to visit.     Past Medical History:  Diagnosis Date  . Breast cancer ([place]) 3546,5681   R breast cancer x 2; lumpectomy with radiation, chemotherapy; mastectomy R.    Past Surgical History:  Procedure Laterality Date  . ABDOMINAL HYSTERECTOMY  1986  . BREAST SURGERY Right 2014    Social History  Substance Use Topics  . Smoking status: Never Smoker  . Smokeless tobacco: Never Used  . Alcohol use 0.6 oz/week    1 Glasses of wine per week    Family History  Problem Relation Age of Onset  . Heart disease Mother   . Cancer Father     Review of Systems  Constitutional: Negative for chills, diaphoresis, fever and malaise/fatigue.  HENT: Positive for ear pain. Negative for congestion, ear discharge, sinus pain, sore throat and tinnitus.   Respiratory: Negative for cough and shortness of breath.   Cardiovascular: Negative for chest pain, palpitations and leg swelling.  Gastrointestinal: Positive for nausea. Negative for abdominal pain and vomiting.  Neurological: Positive for dizziness. Negative for tingling, sensory change, speech change, focal weakness and headaches.     OBJECTIVE:  Blood pressure 136/84, pulse 65, temperature 97.9 F (36.6  C), temperature source Oral, resp. rate 16, height 5' 4.5" (1.638 m), weight 122 lb 12.8 oz (55.7 kg), SpO2 100 %.  Physical Exam  Constitutional: She is oriented to person, place, and time and well-developed, well-nourished, and in no distress.  HENT:  Head: Normocephalic and atraumatic.  Right Ear: Hearing, tympanic membrane, external ear and ear canal normal.  Left Ear: Hearing, tympanic membrane, external ear and ear canal normal.  Mouth/Throat: Oropharynx is clear and moist.  Eyes: Pupils are equal, round, and reactive to light. EOM are  normal.  Neck: Neck supple. Carotid bruit is not present.  Cardiovascular: Normal rate, regular rhythm, normal heart sounds and intact distal pulses.  Exam reveals no gallop and no friction rub.   No murmur heard. Pulmonary/Chest: Effort normal and breath sounds normal. She has no wheezes. She has no rales.  Musculoskeletal:       Right hand: She exhibits swelling. She exhibits normal range of motion, no tenderness and normal capillary refill. Normal sensation noted. Normal strength noted.       Hands: Right middle finger with swelling and ecchymosis  Lymphadenopathy:    She has no cervical adenopathy.  Neurological: She is alert and oriented to person, place, and time. She has normal sensation, normal strength, normal reflexes and intact cranial nerves. Gait normal.  Skin: Skin is warm and dry.      ASSESSMENT and PLAN:  1. Jaw pain Unclear etiology and atypical. Resolved today. EKG unremarkable.  - EKG 12-Lead, NSR, LAE, normal intervals, no ST changes  2. Swollen finger Discussed supportive measures, RICE therapy, buddy tapping. Xray cant exclude distal tip FB and vertical fracture. Patient informed via phone of results. RTC precautions given.  - DG Finger Middle Right; Future       [place], MD Primary Care at [place], [place] [address] Ph.  [phone] Fax [phone]

## 2017-03-11 NOTE — Patient Instructions (Signed)
     IF you received an x-ray today, you will receive an invoice from [facility]. Please contact  Radiology at [phone] with questions or concerns regarding your invoice.   IF you received labwork today, you will receive an invoice from [facility]. Please contact [facility] at [phone] with questions or concerns regarding your invoice.   Our billing staff will not be able to assist you with questions regarding bills from these companies.  You will be contacted with the lab results as soon as they are available. The fastest way to get your results is to activate your My Chart account. Instructions are located on the last page of this paperwork. If you have not heard from us regarding the results in 2 weeks, please contact this office.     

## 2017-03-14 ENCOUNTER — Ambulatory Visit: Payer: BLUE CROSS/BLUE SHIELD | Admitting: Family Medicine

## 2017-03-19 ENCOUNTER — Ambulatory Visit (INDEPENDENT_AMBULATORY_CARE_PROVIDER_SITE_OTHER): Payer: BLUE CROSS/BLUE SHIELD | Admitting: Family Medicine

## 2017-03-19 ENCOUNTER — Encounter: Payer: Self-pay | Admitting: Family Medicine

## 2017-03-19 VITALS — BP 126/78 | HR 71 | Temp 98.1°F | Resp 18 | Ht 64.5 in | Wt 122.0 lb

## 2017-03-19 DIAGNOSIS — S62639A Displaced fracture of distal phalanx of unspecified finger, initial encounter for closed fracture: Secondary | ICD-10-CM | POA: Diagnosis not present

## 2017-03-19 NOTE — Patient Instructions (Addendum)
Wear finger splint during the day for the remainder of this week. Remove splint every night and perform range of motion exercises to work out any stiffness. Return for recurrent bruising or pain.   We recommend that you schedule a mammogram for breast cancer screening. Typically, you do not need a referral to do this. Please contact a local imaging center to schedule your mammogram.  [facility] - [phone]  *ask for the Radiology Department [facility] - [phone] or [phone]  [facility] - [phone] [place] [phone] [facility] - [phone]  *ask for the [facility] - [phone]  *ask for the Radiology Department [facility] - [phone]  *ask for the [facility] - [phone]    IF you received an x-ray today, you will receive an invoice from [facility]. Please contact [facility] at [phone] with questions or concerns regarding your invoice.   IF you received labwork today, you will receive an invoice from [facility]. Please contact [facility] at [phone] with questions or concerns regarding your invoice.   Our billing staff will not be able to assist you with questions regarding bills from these companies.  You will be contacted with the lab results as soon as they are available. The fastest way to get your results is to activate your My Chart account. Instructions are located on the last page of this paperwork. If you have not heard from [place] regarding the results in 2 weeks, please contact this office.

## 2017-03-19 NOTE — Progress Notes (Signed)
Subjective:    Patient ID: Cheyenne Sanders, female    DOB: 1951-01-14, 66 y.o.   MRN: [id number]  03/19/2017  Finger pain (pt had a x-ray done on 03/11/2017 and pt states finger isn't purple or swollen anymore, Pt states it doesn't hurt.) and Follow-up   HPI This 66 y.o. female presents for evaluation of RIGHT third finger tuft fracture and possible foreign body suggested on finger xray.  Bruising has all resolved.  No pain ever.  Swelling has improved but is still present.  Has been wearing buddy tape since visit one week ago.  Does not recall injury or trauma.  Denies numbness or tingling.    BP Readings from Last 3 Encounters:  03/19/17 126/78  03/11/17 136/84  08/27/16 (!) 148/89   Wt Readings from Last 3 Encounters:  03/19/17 122 lb (55.3 kg)  03/11/17 122 lb 12.8 oz (55.7 kg)  08/27/16 124 lb 6.4 oz (56.4 kg)    There is no immunization history on file for this patient.  Review of Systems  Constitutional: Negative for chills, diaphoresis, fatigue and fever.  HENT: Negative for ear pain, postnasal drip, rhinorrhea, sinus pressure, sore throat and trouble swallowing.   Respiratory: Negative for cough and shortness of breath.   Cardiovascular: Negative for chest pain, palpitations and leg swelling.  Gastrointestinal: Negative for abdominal pain, constipation, diarrhea, nausea and vomiting.  Musculoskeletal: Positive for joint swelling.  Hematological: Negative for adenopathy. Does not bruise/bleed easily.    Past Medical History:  Diagnosis Date  . Breast cancer ([place]) 8676,7209   R breast cancer x 2; lumpectomy with radiation, chemotherapy; mastectomy R.   Past Surgical History:  Procedure Laterality Date  . ABDOMINAL HYSTERECTOMY  1986  . BREAST SURGERY Right 2014   No Known Allergies Current Outpatient Prescriptions  Medication Sig Dispense Refill  . GuaiFENesin (MUCUS RELIEF ADULT PO) Take by mouth.    . phenylephrine (SUDAFED PE) 10 MG TABS tablet Take 10 mg  by mouth every 4 (four) hours as needed.     No current facility-administered medications for this visit.    Social History   Social History  . Marital status: Married    Spouse name: N/A  . Number of children: N/A  . Years of education: N/A   Occupational History  . Not on file.   Social History Main Topics  . Smoking status: Never Smoker  . Smokeless tobacco: Never Used  . Alcohol use 0.6 oz/week    1 Glasses of wine per week  . Drug use: No  . Sexual activity: Not on file   Other Topics Concern  . Not on file   Social History Narrative  . No narrative on file   Family History  Problem Relation Age of Onset  . Heart disease Mother   . Cancer Father        Objective:    BP 126/78 (BP Location: Left Arm, Patient Position: Sitting, Cuff Size: Normal)   Pulse 71   Temp 98.1 F (36.7 C) (Oral)   Resp 18   Ht 5' 4.5" (1.638 m)   Wt 122 lb (55.3 kg)   SpO2 96%   BMI 20.62 kg/m  Physical Exam  Constitutional: She is oriented to person, place, and time. She appears well-developed and well-nourished. No distress.  HENT:  Head: Normocephalic and atraumatic.  Eyes: Pupils are equal, round, and reactive to light. Conjunctivae are normal.  Neck: Normal range of motion. Neck supple.  Cardiovascular: Normal rate, regular  rhythm, normal heart sounds and normal pulses.  Exam reveals no gallop and no friction rub.   No murmur heard. Capillary refill < 3 seconds third digit RIGHT.  Pulmonary/Chest: Effort normal and breath sounds normal. She has no wheezes. She has no rales.  Musculoskeletal:  R THIRD FINGER: mild swelling diffusely of digit; more pronounced swelling at PIP joint; full ROM without pain.  Non-tender with palpation at distal finger at fat pad.  Strength intact with flexion and extension resistance.  Neurological: She is alert and oriented to person, place, and time.  Skin: She is not diaphoretic.  Psychiatric: She has a normal mood and affect. Her behavior  is normal.  Nursing note and vitals reviewed.   No results found. Depression screen [facility] 2/9 03/19/2017 03/11/2017 08/27/2016 07/25/2016  Decreased Interest 0 0 0 0  Down, Depressed, Hopeless 0 0 0 0  PHQ - 2 Score 0 0 0 0   Fall Risk  03/19/2017 03/11/2017 08/27/2016 07/25/2016  Falls in the past year? No No No No        Assessment & Plan:   1. Closed fracture of tuft of distal phalanx of finger    -improving with symptomatic support with buddy taping; injury or symptoms onset one month ago; thus, recommend weaning buddy taping after one week; also recommend removing splint every night and start performing passive ROM of finger every evening. -xray reviewed during visit with patient.  No indication for repeat imaging at this time.  RTC PRN only.  No orders of the defined types were placed in this encounter.  No orders of the defined types were placed in this encounter.   No Follow-up on file.   Eloyse Causey Elayne Guerin, M.D. Primary Care at [facility] [address] [place], [place]  [address] [phone] phone [phone] fax

## 2017-04-26 ENCOUNTER — Ambulatory Visit: Payer: BLUE CROSS/BLUE SHIELD | Admitting: Family Medicine

## 2017-05-06 ENCOUNTER — Ambulatory Visit: Payer: BLUE CROSS/BLUE SHIELD | Admitting: Podiatry

## 2017-05-13 ENCOUNTER — Encounter: Payer: Self-pay | Admitting: Podiatry

## 2017-05-13 ENCOUNTER — Ambulatory Visit (INDEPENDENT_AMBULATORY_CARE_PROVIDER_SITE_OTHER): Payer: BLUE CROSS/BLUE SHIELD | Admitting: Podiatry

## 2017-05-13 VITALS — BP 128/77 | HR 64 | Resp 16

## 2017-05-13 DIAGNOSIS — L603 Nail dystrophy: Secondary | ICD-10-CM | POA: Diagnosis not present

## 2017-05-13 NOTE — Progress Notes (Signed)
  Subjective:  Patient ID: Cheyenne Sanders, female    DOB: April 07, 1951,  MRN: [id number] HPI Chief Complaint  Patient presents with  . Nail Problem    Hallux nail left - discolored, thickened area x few months, concerned area will spread, no treatment    66 y.o. female presents with the above complaint.     Past Medical History:  Diagnosis Date  . Breast cancer ([place]) 0233,4356   R breast cancer x 2; lumpectomy with radiation, chemotherapy; mastectomy R.   Past Surgical History:  Procedure Laterality Date  . ABDOMINAL HYSTERECTOMY  1986  . BREAST SURGERY Right 2014   No current outpatient prescriptions on file.  Allergies  Allergen Reactions  . Fentanyl Shortness Of Breath   Review of Systems  All other systems reviewed and are negative.  Objective:   Vitals:   05/13/17 1058  BP: 128/77  Pulse: 64  Resp: 16    General: Well developed, nourished, in no acute distress, alert and oriented x3   Dermatological: Skin is warm, dry and supple bilateral. Nails x 10 are well maintained; remaining integument appears unremarkable at this time. There are no open sores, no preulcerative lesions, no rash or signs of infection present.hallux nail plate left does demonstrate what appears to be a nail dystrophy or possible onychomycosis to the medial one third of the nail.  Vascular: Dorsalis Pedis artery and Posterior Tibial artery pedal pulses are 2/4 bilateral with immedate capillary fill time. Pedal hair growth present. No varicosities and no lower extremity edema present bilateral.   Neruologic: Grossly intact via light touch bilateral. Vibratory intact via tuning fork bilateral. Protective threshold with Semmes Wienstein monofilament intact to all pedal sites bilateral. Patellar and Achilles deep tendon reflexes 2+ bilateral. No Babinski or clonus noted bilateral. History of neuromas and neuritis nonreproducible today.  Musculoskeletal: No gross boney pedal deformities bilateral.  No pain, crepitus, or limitation noted with foot and ankle range of motion bilateral. Muscular strength 5/5 in all groups tested bilateral.  Gait: Unassisted, Nonantalgic.    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  Plan: a sample of the nail and skin were taken today to be sent for pathologic evaluation.     Tia Hieronymus T. [place], [place]

## 2017-05-13 NOTE — Patient Instructions (Signed)

## 2017-05-20 ENCOUNTER — Ambulatory Visit: Payer: BLUE CROSS/BLUE SHIELD | Admitting: Physician Assistant

## 2017-05-22 ENCOUNTER — Ambulatory Visit: Payer: BLUE CROSS/BLUE SHIELD | Admitting: Family Medicine

## 2017-05-22 ENCOUNTER — Other Ambulatory Visit: Payer: Self-pay

## 2017-05-22 ENCOUNTER — Encounter: Payer: Self-pay | Admitting: Family Medicine

## 2017-05-22 VITALS — BP 122/72 | HR 66 | Temp 98.0°F | Resp 18 | Ht 64.92 in | Wt 122.2 lb

## 2017-05-22 DIAGNOSIS — M7989 Other specified soft tissue disorders: Secondary | ICD-10-CM

## 2017-05-22 DIAGNOSIS — W57XXXA Bitten or stung by nonvenomous insect and other nonvenomous arthropods, initial encounter: Secondary | ICD-10-CM | POA: Diagnosis not present

## 2017-05-22 NOTE — Progress Notes (Addendum)
   11/8/201810:23 AM  Cheyenne Sanders 1950/08/02, 66 y.o. female [id number]  Chief Complaint  Patient presents with  . Insect Bite    X 3-4 days- back of left lower leg- possible spider bite     HPI:   Patient is a 66 y.o. female who presents today for 3-4 days of left lower leg swelling, puritus, redness and a small blister. It all started after she went hiking. She has been elevating it and placing topical abx ointment. She feels it is getting better. She denies any fever, chills, nausea, vomiting, cough, SOB, chest pain.  Depression screen [facility] 2/9 05/22/2017 03/19/2017 03/11/2017  Decreased Interest 0 0 0  Down, Depressed, Hopeless 0 0 0  PHQ - 2 Score 0 0 0    Allergies  Allergen Reactions  . Fentanyl Shortness Of Breath    Prior to Admission medications   Not on File    Past Medical History:  Diagnosis Date  . Breast cancer ([place]) [id number]   R breast cancer x 2; lumpectomy with radiation, chemotherapy; mastectomy R.    Past Surgical History:  Procedure Laterality Date  . ABDOMINAL HYSTERECTOMY  1986  . BREAST SURGERY Right 2014    Social History   Tobacco Use  . Smoking status: Never Smoker  . Smokeless tobacco: Never Used  Substance Use Topics  . Alcohol use: Yes    Alcohol/week: 0.6 oz    Types: 1 Glasses of wine per week    Family History  Problem Relation Age of Onset  . Heart disease Mother   . Cancer Father     ROS Per hpi  OBJECTIVE:  Blood pressure 122/72, pulse 66, temperature 98 F (36.7 C), temperature source Oral, resp. rate 18, height 5' 4.92" (1.649 m), weight 122 lb 3.2 oz (55.4 kg), SpO2 99 %.  Physical Exam  Gen: AAOx3, NAD CV: RRR, no m/r/g Pulm: CTAB, no w/r/r MSK: Left lower leg, posterior calf with central clear blister surrounded by erythema and swelling, no warmth, no TTP, + 2 distal pulses.  ASSESSMENT and PLAN  1. Swelling of lower leg 2. Insect bite, initial encounter  Discussed no ssx of infection at this  time. Discussed supportive measures. Patient educational handout given. RTC precautions discussed.   Return if symptoms worsen or fail to improve.    Rutherford Guys, MD Primary Care at [place], [place] [address] Ph.  [phone] Fax [phone]

## 2017-05-22 NOTE — Patient Instructions (Addendum)
   IF you received an x-ray today, you will receive an invoice from [facility]. Please contact [facility] at [phone] with questions or concerns regarding your invoice.   IF you received labwork today, you will receive an invoice from [facility]. Please contact [facility] at [phone] with questions or concerns regarding your invoice.   Our billing staff will not be able to assist you with questions regarding bills from these companies.  You will be contacted with the lab results as soon as they are available. The fastest way to get your results is to activate your My Chart account. Instructions are located on the last page of this paperwork. If you have not heard from us regarding the results in 2 weeks, please contact this office.      Insect Bite, Adult An insect bite can make your skin red, itchy, and swollen. An insect bite is different from an insect sting, which happens when an insect injects poison (venom) into the skin. Some insects can spread disease to people through a bite. However, most insect bites do not lead to disease and are not serious. What are the causes? Insects may bite for a variety of reasons, including:  Hunger.  To defend themselves.  Insects that bite include:  Spiders.  Mosquitoes.  Ticks.  Fleas.  Ants.  Flies.  Bedbugs.  What are the signs or symptoms? Symptoms of this condition include:  Itching or pain in the bite area.  Redness and swelling in the bite area.  An open wound (skin ulcer).  In many cases, symptoms last for 2-4 days. How is this diagnosed? This condition is usually diagnosed based on symptoms and a physical exam. How is this treated? Treatment is usually not needed. Symptoms often go away on their own. When treatment is recommended, it may involve:  Applying a cream or lotion to the bitten area. This treatment helps with itching.  Taking an antibiotic medicine. This treatment is needed if  the bite area gets infected.  Getting a tetanus shot.  Applying ice to the affected area.  Medicines called antihistamines. This treatment is needed if you develop an allergic reaction to the insect bite.  Follow these instructions at home: Bite area care  Do not scratch the bite area.  Keep the bite area clean and dry. Wash it every day with soap and water as told by your health care provider.  Check the bite area every day for signs of infection. Check for: ? More redness, swelling, or pain. ? Fluid or blood. ? Warmth. ? Pus. Managing pain, itching, and swelling   You may apply a baking soda paste, cortisone cream, or calamine lotion to the bite area as told by your health care provider.  If directed, applyice to the bite area. ? Put ice in a plastic bag. ? Place a towel between your skin and the bag. ? Leave the ice on for 20 minutes, 2-3 times per day. Medicines  Apply or take over-the-counter and prescription medicines only as told by your health care provider.  If you were prescribed an antibiotic medicine, use it as told by your health care provider. Do not stop using the antibiotic even if your condition improves. General instructions  Keep all follow-up visits as told by your health care provider. This is important. How is this prevented? To help reduce your risk of insect bites:  When you are outdoors, wear clothing that covers your arms and legs.  Use insect repellent. The best   insect repellents contain: ? DEET, picaridin, oil of lemon eucalyptus (OLE), or IR3535. ? Higher amounts of an active ingredient.  If your home windows do not have screens, consider installing them.  Contact a health care provider if:  You have more redness, swelling, or pain in the bite area.  You have fluid, blood, or pus coming from the bite area.  The bite area feels warm to the touch.  You have a fever. Get help right away if:  You have joint pain.  You have a  rash.  You have shortness of breath.  You feel unusually tired or sleepy.  You have neck pain.  You have a headache.  You have unusual weakness.  You have chest pain.  You have nausea, vomiting, or pain in the abdomen. This information is not intended to replace advice given to you by your health care provider. Make sure you discuss any questions you have with your health care provider. Document Released: 08/08/2004 Document Revised: 02/28/2016 Document Reviewed: 01/08/2016 Elsevier Interactive Patient Education  2018 Elsevier Inc.  

## 2017-05-28 ENCOUNTER — Telehealth: Payer: Self-pay | Admitting: Family Medicine

## 2017-05-28 NOTE — Telephone Encounter (Signed)
Copied from [place] [id number]. Topic: Appointment Scheduling - Scheduling Inquiry for Clinic >> May 28, 2017  7:52 AM Ether Griffins B wrote: Reason for CRM: pt has bcbs and needs awv with pcp. Didn't see anything available until Dec and pt is wanting a referral as well and would like to be seen sooner if possible

## 2017-05-28 NOTE — Telephone Encounter (Signed)
Will send to referral dept and to [facility] she schedules our AWV

## 2017-05-28 NOTE — Telephone Encounter (Signed)
Tried calling pt to see what referral she is needing from [place] message. I reached pt's voicemail and the mailbox was full so unable to leave message.

## 2017-06-03 ENCOUNTER — Telehealth: Payer: Self-pay | Admitting: Family Medicine

## 2017-06-03 ENCOUNTER — Ambulatory Visit: Payer: BLUE CROSS/BLUE SHIELD | Admitting: Podiatry

## 2017-06-03 DIAGNOSIS — L603 Nail dystrophy: Secondary | ICD-10-CM

## 2017-06-03 NOTE — Telephone Encounter (Signed)
Called pt back from [place] requesting to speak with someone about a referral. Pt is requesting a referral to gynecology and urology. She has an OV scheduled for 06/16/17 to discuss getting a gynecology referral because she stated she was told she had to have an OV in order to get this. Pt is wanting to establish with both gynecology and urology. She said she has had bad kidney stones in the past and this is why she needs urology. I told the pt I would put a message in and clarify if she needed an OV for the referral, but to keep the appt for now and if for any reason she does not need an appt we will let her know. Thanks!

## 2017-06-03 NOTE — Progress Notes (Signed)
She presents today for follow-up of her nail pathology.  Objective: Onychomycosis per pathology.  Assessment: Onychomycosis.  Plan: Discussed our options today. She would like to start with laser therapy. We discussed this in great detail today follow up with me after therapy is complete otherwise she will see the RN for laser treatment.

## 2017-06-06 NOTE — Telephone Encounter (Signed)
Call --- 1.  Patient was referred to urology last year.  Is she wanting to see another urologist?  2.  She should not need a referral to gynecology.  Has she called a gynecologist for an appointment?  Is her insurance requiring these referrals?

## 2017-06-09 ENCOUNTER — Ambulatory Visit: Payer: BLUE CROSS/BLUE SHIELD | Admitting: Family Medicine

## 2017-06-10 ENCOUNTER — Ambulatory Visit: Payer: BLUE CROSS/BLUE SHIELD | Admitting: Podiatry

## 2017-06-16 ENCOUNTER — Other Ambulatory Visit: Payer: Self-pay

## 2017-06-16 ENCOUNTER — Encounter: Payer: Self-pay | Admitting: Family Medicine

## 2017-06-16 ENCOUNTER — Ambulatory Visit: Payer: BLUE CROSS/BLUE SHIELD | Admitting: Family Medicine

## 2017-06-16 VITALS — BP 102/72 | HR 59 | Temp 97.9°F | Resp 16 | Ht 64.96 in | Wt 126.0 lb

## 2017-06-16 DIAGNOSIS — Z1231 Encounter for screening mammogram for malignant neoplasm of breast: Secondary | ICD-10-CM | POA: Diagnosis not present

## 2017-06-16 DIAGNOSIS — N200 Calculus of kidney: Secondary | ICD-10-CM | POA: Diagnosis not present

## 2017-06-16 DIAGNOSIS — Z853 Personal history of malignant neoplasm of breast: Secondary | ICD-10-CM | POA: Diagnosis not present

## 2017-06-16 DIAGNOSIS — Z0000 Encounter for general adult medical examination without abnormal findings: Secondary | ICD-10-CM | POA: Diagnosis not present

## 2017-06-16 DIAGNOSIS — Z1239 Encounter for other screening for malignant neoplasm of breast: Secondary | ICD-10-CM

## 2017-06-16 DIAGNOSIS — E782 Mixed hyperlipidemia: Secondary | ICD-10-CM | POA: Diagnosis not present

## 2017-06-16 DIAGNOSIS — Z23 Encounter for immunization: Secondary | ICD-10-CM

## 2017-06-16 LAB — POCT URINALYSIS DIP (MANUAL ENTRY)
Bilirubin, UA: NEGATIVE
Blood, UA: NEGATIVE
Glucose, UA: NEGATIVE mg/dL
Ketones, POC UA: NEGATIVE mg/dL
Nitrite, UA: POSITIVE — AB
Protein Ur, POC: NEGATIVE mg/dL
Spec Grav, UA: 1.025 (ref 1.010–1.025)
Urobilinogen, UA: 0.2 E.U./dL
pH, UA: 6.5 (ref 5.0–8.0)

## 2017-06-16 MED ORDER — ZOSTER VAC RECOMB ADJUVANTED 50 MCG/0.5ML IM SUSR: 0.5000 mL | Freq: Once | INTRAMUSCULAR | 1 refills | Status: AC

## 2017-06-16 NOTE — Patient Instructions (Addendum)
Urologist: [facility] next to [facility]  Statistician in [place]:  Darron Doom, MD;  Fayne Mediate, MD; Dr. Fritz Pickerel.      IF you received an x-ray today, you will receive an invoice from [facility]. Please contact [facility] at [phone] with questions or concerns regarding your invoice.   IF you received labwork today, you will receive an invoice from [facility]. Please contact [facility] at [phone] with questions or concerns regarding your invoice.   Our billing staff will not be able to assist you with questions regarding bills from these companies.  You will be contacted with the lab results as soon as they are available. The fastest way to get your results is to activate your My Chart account. Instructions are located on the last page of this paperwork. If you have not heard from [place] regarding the results in 2 weeks, please contact this office.      Preventive Care 40-64 Years, Female Preventive care refers to lifestyle choices and visits with your health care provider that can promote health and wellness. What does preventive care include?  A yearly physical exam. This is also called an annual well check.  Dental exams once or twice a year.  Routine eye exams. Ask your health care provider how often you should have your eyes checked.  Personal lifestyle choices, including: ? Daily care of your teeth and gums. ? Regular physical activity. ? Eating a healthy diet. ? Avoiding tobacco and drug use. ? Limiting alcohol use. ? Practicing safe sex. ? Taking low-dose aspirin daily starting at age 66. ? Taking vitamin and mineral supplements as recommended by your health care provider. What happens during an annual well check? The services and screenings done by your health care provider during your annual well check will depend on your age, overall health, lifestyle risk factors, and family history of disease. Counseling Your  health care provider may ask you questions about your:  Alcohol use.  Tobacco use.  Drug use.  Emotional well-being.  Home and relationship well-being.  Sexual activity.  Eating habits.  Work and work Statistician.  Method of birth control.  Menstrual cycle.  Pregnancy history.  Screening You may have the following tests or measurements:  Height, weight, and BMI.  Blood pressure.  Lipid and cholesterol levels. These may be checked every 5 years, or more frequently if you are over 28 years old.  Skin check.  Lung cancer screening. You may have this screening every year starting at age 53 if you have a 30-pack-year history of smoking and currently smoke or have quit within the past 15 years.  Fecal occult blood test (FOBT) of the stool. You may have this test every year starting at age 75.  Flexible sigmoidoscopy or colonoscopy. You may have a sigmoidoscopy every 5 years or a colonoscopy every 10 years starting at age 20.  Hepatitis C blood test.  Hepatitis B blood test.  Sexually transmitted disease (STD) testing.  Diabetes screening. This is done by checking your blood sugar (glucose) after you have not eaten for a while (fasting). You may have this done every 1-3 years.  Mammogram. This may be done every 1-2 years. Talk to your health care provider about when you should start having regular mammograms. This may depend on whether you have a family history of breast cancer.  BRCA-related cancer screening. This may be done if you have a family history of breast, ovarian, tubal, or peritoneal cancers.  Pelvic exam  and Pap test. This may be done every 3 years starting at age 86. Starting at age 58, this may be done every 5 years if you have a Pap test in combination with an HPV test.  Bone density scan. This is done to screen for osteoporosis. You may have this scan if you are at high risk for osteoporosis.  Discuss your test results, treatment options, and if  necessary, the need for more tests with your health care provider. Vaccines Your health care provider may recommend certain vaccines, such as:  Influenza vaccine. This is recommended every year.  Tetanus, diphtheria, and acellular pertussis (Tdap, Td) vaccine. You may need a Td booster every 10 years.  Varicella vaccine. You may need this if you have not been vaccinated.  Zoster vaccine. You may need this after age 43.  Measles, mumps, and rubella (MMR) vaccine. You may need at least one dose of MMR if you were born in 1957 or later. You may also need a second dose.  Pneumococcal 13-valent conjugate (PCV13) vaccine. You may need this if you have certain conditions and were not previously vaccinated.  Pneumococcal polysaccharide (PPSV23) vaccine. You may need one or two doses if you smoke cigarettes or if you have certain conditions.  Meningococcal vaccine. You may need this if you have certain conditions.  Hepatitis A vaccine. You may need this if you have certain conditions or if you travel or work in places where you may be exposed to hepatitis A.  Hepatitis B vaccine. You may need this if you have certain conditions or if you travel or work in places where you may be exposed to hepatitis B.  Haemophilus influenzae type b (Hib) vaccine. You may need this if you have certain conditions.  Talk to your health care provider about which screenings and vaccines you need and how often you need them. This information is not intended to replace advice given to you by your health care provider. Make sure you discuss any questions you have with your health care provider. Document Released: 07/28/2015 Document Revised: 03/20/2016 Document Reviewed: 05/02/2015 Elsevier Interactive Patient Education  2017 [employer].

## 2017-06-16 NOTE — Progress Notes (Signed)
Subjective:    Patient ID: Cheyenne Sanders, female    DOB: [birth year], 66 y.o.   MRN: [id number]  06/16/2017  Referral (pt states she would like top talk about some referrals she needs )    HPI This 66 y.o. female presents for Complete Physical Examination.  Last physical: n/a Pap smear: hysterectomy; still has ovaries.   Mammogram:  2004 Colonoscopy:  2016 Bone density:  No recent bone density Eye exam:  Glasses; one week ago. Dental exam:  Every six months.    Palpitations: husband saw; appointment with Dr. Claiborne Billings.  Gynecology referral for annual exam. Prefers female.    Kidney stones: previous kidney stones.  Recurrent UTIs in the past.  Referred to Tresa Endo, MD [facility] on [address].   Happy about that visit.  Ordered renal US.  Would like a [facility].   Dad saw a urologist from [facility].    Requesting referral to oncology; breast cancer x 2.    No history of CAD with stenting; this history is husband's history and not patients. No exam data present   BP Readings from Last 3 Encounters:  06/16/17 102/72  05/22/17 122/72  05/13/17 128/77   Wt Readings from Last 3 Encounters:  06/16/17 126 lb (57.2 kg)  05/22/17 122 lb 3.2 oz (55.4 kg)  03/19/17 122 lb (55.3 kg)   Immunization History  Administered Date(s) Administered  . Influenza-Unspecified 05/13/2017    Review of Systems  Constitutional: Negative for activity change, appetite change, chills, diaphoresis, fatigue, fever and unexpected weight change.  HENT: Negative for congestion, dental problem, drooling, ear discharge, ear pain, facial swelling, hearing loss, mouth sores, nosebleeds, postnasal drip, rhinorrhea, sinus pressure, sneezing, sore throat, tinnitus, trouble swallowing and voice change.   Eyes: Negative for photophobia, pain, discharge, redness, itching and visual disturbance.  Respiratory: Negative for apnea, cough, choking, chest tightness, shortness of breath, wheezing and  stridor.   Cardiovascular: Negative for chest pain, palpitations and leg swelling.  Gastrointestinal: Negative for abdominal distention, abdominal pain, anal bleeding, blood in stool, constipation, diarrhea, nausea, rectal pain and vomiting.  Endocrine: Negative for cold intolerance, heat intolerance, polydipsia, polyphagia and polyuria.  Genitourinary: Negative for decreased urine volume, difficulty urinating, dyspareunia, dysuria, enuresis, flank pain, frequency, genital sores, hematuria, menstrual problem, pelvic pain, urgency, vaginal bleeding, vaginal discharge and vaginal pain.       Nocturia 0-1.  No urinary leakage.  Musculoskeletal: Negative for arthralgias, back pain, gait problem, joint swelling, myalgias, neck pain and neck stiffness.  Skin: Negative for color change, pallor, rash and wound.  Allergic/Immunologic: Negative for environmental allergies, food allergies and immunocompromised state.  Neurological: Negative for dizziness, tremors, seizures, syncope, facial asymmetry, speech difficulty, weakness, light-headedness, numbness and headaches.  Hematological: Negative for adenopathy. Does not bruise/bleed easily.  Psychiatric/Behavioral: Negative for agitation, behavioral problems, confusion, decreased concentration, dysphoric mood, hallucinations, self-injury, sleep disturbance and suicidal ideas. The patient is not nervous/anxious and is not hyperactive.        Bedtime 1000; wakes up 700.    Past Medical History:  Diagnosis Date  . Breast cancer ([place]) 5009,3818   R breast cancer x 2; lumpectomy with radiation, chemotherapy; mastectomy R.  . Nephrolithiasis    Past Surgical History:  Procedure Laterality Date  . ABDOMINAL HYSTERECTOMY  1986   DUB; ovaries intact.  Marland Kitchen BREAST SURGERY Right 2014  . cardiac stenting  07/15/1989   Allergies  Allergen Reactions  . Fentanyl Shortness Of Breath   No current  outpatient medications on file prior to visit.   No current  facility-administered medications on file prior to visit.    Social History   Socioeconomic History  . Marital status: Married    Spouse name: Not on file  . Number of children: Not on file  . Years of education: Not on file  . Highest education level: Not on file  Social Needs  . Financial resource strain: Not on file  . Food insecurity - worry: Not on file  . Food insecurity - inability: Not on file  . Transportation needs - medical: Not on file  . Transportation needs - non-medical: Not on file  Occupational History  . Not on file  Tobacco Use  . Smoking status: Never Smoker  . Smokeless tobacco: Never Used  Substance and Sexual Activity  . Alcohol use: Yes    Alcohol/week: 0.6 oz    Types: 1 Glasses of wine per week  . Drug use: No  . Sexual activity: Not on file  Other Topics Concern  . 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      Tobacco: none      Alcohol: rare; social      Exercise: no formal exercise.        ADLs: independent with ADLs   Family History  Problem Relation Age of Onset  . Heart disease Mother   . Cancer Father        Objective:    BP 102/72   Pulse (!) 59   Temp 97.9 F (36.6 C) (Oral)   Resp 16   Ht 5' 4.96" (1.65 m)   Wt 126 lb (57.2 kg)   SpO2 96%   BMI 20.99 kg/m  Physical Exam  Constitutional: She is oriented to person, place, and time. She appears well-developed and well-nourished. No distress.  HENT:  Head: Normocephalic and atraumatic.  Right Ear: External ear normal.  Left Ear: External ear normal.  Nose: Nose normal.  Mouth/Throat: Oropharynx is clear and moist.  Eyes: Conjunctivae and EOM are normal. Pupils are equal, round, and reactive to light.  Neck: Normal range of motion.  Neck supple. Carotid bruit is not present. No thyromegaly present.  Cardiovascular: Normal rate, regular rhythm, normal heart sounds and intact distal pulses. Exam reveals no gallop and no friction rub.  No murmur heard. Pulmonary/Chest: Effort normal and breath sounds normal. She has no wheezes. She has no rales.  Abdominal: Soft. Bowel sounds are normal. She exhibits no distension and no mass. There is no tenderness. There is no rebound and no guarding.  Lymphadenopathy:    She has no cervical adenopathy.  Neurological: She is alert and oriented to person, place, and time. No cranial nerve deficit.  Skin: Skin is warm and dry. No rash noted. She is not diaphoretic. No erythema. No pallor.  Psychiatric: She has a normal mood and affect. Her behavior is normal.   No results found. Depression screen [facility] 2/9 06/16/2017 05/22/2017 03/19/2017 03/11/2017 08/27/2016  Decreased Interest 0 0 0 0 0  Down, Depressed, Hopeless 0 0 0 0 0  PHQ - 2 Score 0 0 0 0 0   Fall Risk  06/16/2017 05/22/2017 03/19/2017 03/11/2017  08/27/2016  Falls in the past year? No No No No No        Assessment & Plan:   1. Routine physical examination   2. Personal history of breast cancer   3. Recurrent nephrolithiasis   4. Mixed hyperlipidemia   5. Need for shingles vaccine   6. Breast cancer screening     -anticipatory guidance provided --- exercise, weight loss, safe driving practices, aspirin 81mg  daily. -obtain age appropriate screening labs and labs for chronic disease management. -refer for mammogram. -refer to oncology due to history of breast cancer. -provided names of gynecologist and urologist in the area. Pt to contact for appointment. -no personal history of CAD.     Orders Placed This Encounter  Procedures  . MM Digital Screening Unilat L    Standing Status:   Future    Standing Expiration Date:   08/17/2018    Order Specific Question:   Reason for Exam (SYMPTOM  OR DIAGNOSIS REQUIRED)    Answer:   annual  screening; history of breast cancer RIGHT breast x 2    Order Specific Question:   Preferred imaging location?    Answer:   [facility]  . Ambulatory referral to Oncology    Referral Priority:   Routine    Referral Type:   Consultation    Referral Reason:   Specialty Services Required    Number of Visits Requested:   1  . POCT urinalysis dipstick   Meds ordered this encounter  Medications  . Zoster Vaccine Adjuvanted [facility]) injection    Sig: Inject 0.5 mLs into the muscle once for 1 dose.    Dispense:  0.5 mL    Refill:  1    Return in about 1 year (around 06/16/2018) for complete physical examiniation.   Juda Lajeunesse Elayne Guerin, M.D. Primary Care at [facility] [address] [place], [place]  [address] [phone] phone [phone] fax

## 2017-06-17 ENCOUNTER — Ambulatory Visit: Payer: BLUE CROSS/BLUE SHIELD | Admitting: Cardiovascular Disease

## 2017-06-17 ENCOUNTER — Ambulatory Visit: Payer: BLUE CROSS/BLUE SHIELD

## 2017-06-17 ENCOUNTER — Encounter: Payer: Self-pay | Admitting: Cardiovascular Disease

## 2017-06-17 ENCOUNTER — Ambulatory Visit: Payer: BLUE CROSS/BLUE SHIELD | Admitting: Family Medicine

## 2017-06-17 VITALS — BP 140/80 | HR 72 | Ht 64.0 in | Wt 125.8 lb

## 2017-06-17 DIAGNOSIS — R011 Cardiac murmur, unspecified: Secondary | ICD-10-CM | POA: Diagnosis not present

## 2017-06-17 DIAGNOSIS — R002 Palpitations: Secondary | ICD-10-CM

## 2017-06-17 DIAGNOSIS — I10 Essential (primary) hypertension: Secondary | ICD-10-CM | POA: Diagnosis not present

## 2017-06-17 DIAGNOSIS — R0789 Other chest pain: Secondary | ICD-10-CM | POA: Diagnosis not present

## 2017-06-17 MED ORDER — METOPROLOL SUCCINATE ER 25 MG PO TB24: 25.0000 mg | ORAL_TABLET | Freq: Every day | ORAL | 3 refills | Status: DC

## 2017-06-17 NOTE — Progress Notes (Signed)
Cardiology Office Note    Date:  06/23/2017   ID:  Cheyenne [facility], DOB 1950-09-11, MRN [id number]  PCP:  Cheyenne Honour, MD  Cardiologist:  Cheyenne Majestic, MD   New cardiology evaluation, referred through the courtesy of Dr. Grant Sanders for evaluation palpitations and atypical chest pain  History of Present Illness:  Cheyenne Sanders is a 66 y.o. female who is referred through the courtesy of Dr. Pamella Sanders shouldn't had presented with atypical chest pain and intermittent palpitations.  Ms. Cheyenne Sanders denies any known cardiac history.  She is originally from [place], but moved to [place].  While in [place] in 2016 she had developed an episode of atypical chest pain and underwent a routine treadmill test.  She was told that this was normal.  She has moved back to [place] after her husband's death.   Recently she has noticed some episodes of palpitations which are short-lived.  She also notes a sensation of chest fluttering intermittently and nonexertional chest sensation which often would last 3-4 minutes.  She was recently evaluated at primary care at [facility] with complaints of jaw pain which radiated to her year and down her left arm without associated chest pain.  She denied associated shortness of breath, diaphoresis or nausea.  She was hypertensive.  Her ECG was unremarkable.  She sees [facility].  She presents for cardiology evaluation.  Past Medical History:  Diagnosis Date  . Breast cancer ([facility]) [id number]   R breast cancer x 2; lumpectomy with radiation, chemotherapy; mastectomy R.  . Nephrolithiasis     Past Surgical History:  Procedure Laterality Date  . ABDOMINAL HYSTERECTOMY  1986   DUB; ovaries intact.  [facility] BREAST SURGERY Right 2014  . cardiac stenting  07/15/1989    Current Medications: No outpatient medications prior to visit.   No facility-administered medications prior to visit.      Allergies:   Fentanyl   Social History   Socioeconomic  History  . Marital status: Married    Spouse name: None  . Number of children: None  . Years of education: None  . Highest education level: None  Social Needs  . Financial resource strain: None  . Food insecurity - worry: None  . Food insecurity - inability: None  . Transportation needs - medical: None  . Transportation needs - non-medical: None  Occupational History  . None  Tobacco Use  . Smoking status: Never Smoker  . Smokeless tobacco: Never Used  Substance and Sexual Activity  . Alcohol use: Yes    Alcohol/week: 0.6 oz    Types: 1 Glasses of wine per week  . Drug use: No  . Sexual activity: None  Other Topics Concern  . 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      Tobacco: none      Alcohol: rare; social      Exercise: no formal exercise.        ADLs: independent with ADLs    She is widowed for 4 years.  She lives by herself in [place].  Previously she had worked as a Oceanographer.  There is no tobacco history.  She rarely drinks alcohol.  She does not  routinely exercise.  Family History:  [name] family history includes Cancer in her father; Heart disease in her mother.  Her mother had a history of atrial fibrillation and died at age 60.  Father died at age 67 with kidney failure.  There are no siblings.  She has 2 children.  She lives by herself in [place].  ROS General: Negative; No fevers, chills, or night sweats;  HEENT: Negative; No changes in vision or hearing, sinus congestion, difficulty swallowing Pulmonary: Negative; No cough, wheezing, shortness of breath, hemoptysis Cardiovascular: see HPI GI: Negative; No nausea, vomiting, diarrhea, or abdominal pain GU: Negative; No dysuria, hematuria, or difficulty  voiding Musculoskeletal: Negative; no myalgias, joint pain, or weakness Hematologic/Oncology: Negative; no easy bruising, bleeding Endocrine: Negative; no heat/cold intolerance; no diabetes Neuro: Negative; no changes in balance, headaches Skin: Negative; No rashes or skin lesions Psychiatric: Negative; No behavioral problems, depression Sleep: Negative; No snoring, daytime sleepiness, hypersomnolence, bruxism, restless legs, hypnogognic hallucinations, no cataplexy Other comprehensive 14 point system review is negative.   PHYSICAL EXAM:   VS:  BP 140/80   Pulse 72   Ht '5\' 4"'  (1.626 m)   Wt 125 lb 12.8 oz (57.1 kg)   SpO2 98%   BMI 21.59 kg/m     Repeat blood pressure by me was 150/84.  Wt Readings from Last 3 Encounters:  06/17/17 125 lb 12.8 oz (57.1 kg)  06/16/17 126 lb (57.2 kg)  05/22/17 122 lb 3.2 oz (55.4 kg)    General: Alert, oriented, no distress.  Skin: normal turgor, no rashes, warm and dry HEENT: Normocephalic, atraumatic. Pupils equal round and reactive to light; sclera anicteric; extraocular muscles intact; Fundi without hemorrhages or exudates.  Disks flat. Nose without nasal septal hypertrophy Mouth/Parynx benign; Mallinpatti scale Neck: No JVD, no carotid bruits; normal carotid upstroke Lungs: clear to ausculatation and percussion; no wheezing or rales Chest wall: without tenderness to palpitation Heart: PMI not displaced, RRR, s1 s2 normal, 1/6 systolic murmur, no diastolic murmur, no rubs, gallops, thrills, or heaves Abdomen: soft, nontender; no hepatosplenomehaly, BS+; abdominal aorta nontender and not dilated by palpation. Back: no CVA tenderness Pulses 2+ Musculoskeletal: full range of motion, normal strength, no joint deformities Extremities: no clubbing cyanosis or edema, Homan's sign negative  Neurologic: grossly nonfocal; Cranial nerves grossly wnl Psychologic: Normal mood and affect   Studies/Labs Reviewed:   EKG:  EKG is ordered today. ECG  (independently read by me): Sinus rhythm at 72 bpm with occasional PVC.  Nonspecific ST changes.  QTc interval 468 ms.  PR interval 132 ms.  Recent Labs: BMP Latest Ref Rng & Units 06/17/2017 08/27/2016  Glucose 65 - 99 mg/dL 75 79  BUN 8 - 27 mg/dL 23 21  Creatinine 0.57 - 1.00 mg/dL 0.60 0.65  BUN/Creat Ratio 12 - 28 38(H) 32(H)  Sodium 134 - 144 mmol/L 145(H) 144  Potassium 3.5 - 5.2 mmol/L 4.2 4.5  Chloride 96 - 106 mmol/L 106 102  CO2 20 - 29 mmol/L 23 24  Calcium 8.7 - 10.3 mg/dL 9.2 10.0     Hepatic Function Latest Ref Rng & Units 06/17/2017 08/27/2016  Total Protein 6.0 - 8.5 g/dL 6.4 6.9  Albumin 3.6 - 4.8 g/dL 4.4 4.6  AST 0 - 40 IU/L 25 21  ALT 0 - 32 IU/L 25 27  Alk Phosphatase 39 - 117 IU/L 67 64  Total Bilirubin 0.0 - 1.2 mg/dL 0.4 0.5    CBC Latest Ref Rng & Units 06/17/2017 08/27/2016  WBC 3.4 - 10.8 x10E3/uL 7.7 7.4  Hemoglobin 11.1 - 15.9 g/dL 13.6 14.2  Hematocrit 34.0 - 46.6 % 40.2 47.1(H)  Platelets 150 - 379 x10E3/uL 247 259   Lab Results  Component Value Date   MCV 91 06/17/2017   MCV 102 (H) 08/27/2016   Lab Results  Component Value Date   TSH 1.300 06/17/2017   Lab Results  Component Value Date   HGBA1C 5.5 06/17/2017     BNP No results found for: BNP  ProBNP No results found for: PROBNP   Lipid Panel     Component Value Date/Time   CHOL 217 (H) 06/17/2017 1342   TRIG 77 06/17/2017 1342   HDL 54 06/17/2017 1342   CHOLHDL 4.0 06/17/2017 1342   LDLCALC 148 (H) 06/17/2017 1342     RADIOLOGY: No results found.   Additional studies/ records that were reviewed today include:  Reviewed the records from [facility]:    1. Palpitations   2. Atypical chest pain   3. Essential hypertension   4. Systolic murmur      PLAN:  Ms Curiale is a very pleasant 66 year old widowed female who had a history of remote atypical chest pain and underwent a routine treadmill test in 2016 while living in [place].  This apparently  was normal.  She recently has noted had noticed an episode of left jaw discomfort radiating to her ear and down her left arm.  She denies any exertionally precipitated episodes of chest tightness.  At times she has noticed an occasional palpitation.  There is no tobacco history.  Her mother had hypertension and also history of atrial fibrillation.  Her ECG shows normal sinus rhythm with an occasional PVC.  I suspect the PVC is contributing to her sensation of chest fluttering.  Her blood pressure today is mildly increased and on repeat by me was 150/84.  I discussed with her the updated hypertensive guidelines from November 2017.  I am adding Toprol-XL 25 mg to her medical regimen.  A complete set of laboratory will be obtained in the fasting state.  She does have a 1/6 systolic murmur.  I am scheduling her for 2-D echo Doppler study.  At present, I do not feel follow-up exercise testing is necessary.  I will see her back in the office in 2-3 months for follow-up evaluation or sooner if problems arise.   Medication Adjustments/Labs and Tests Ordered: Current medicines are reviewed at length with the patient today.  Concerns regarding medicines are outlined above.  Medication changes, Labs and Tests ordered today are listed in the Patient Instructions below. Patient Instructions  Medication Instructions:  START metoprolol succinate (Toprol XL) 25 mg daily  Testing/Procedures: Your physician has requested that you have an echocardiogram. Echocardiography is a painless test that uses sound waves to create images of your heart. It provides your doctor with information about the size and shape of your heart and how well your heart's chambers and valves are working. This procedure takes approximately one hour. There are no restrictions for this procedure.  This will be done at our [facility] location:  [place]: Your physician recommends that you schedule a follow-up  appointment in: 2-3 months with Dr. Claiborne Billings.   Any Other Special Instructions Will Be Listed Below (If Applicable).     If you need a refill on your cardiac medications before your next appointment, please call your pharmacy.  Signed, Cheyenne Majestic, MD  06/23/2017 12:59 PM    [facility] [address], [place], [place], [place]  [address] Phone: [phone]

## 2017-06-17 NOTE — Patient Instructions (Addendum)
Medication Instructions:  START metoprolol succinate (Toprol XL) 25 mg daily  Testing/Procedures: Your physician has requested that you have an echocardiogram. Echocardiography is a painless test that uses sound waves to create images of your heart. It provides your doctor with information about the size and shape of your heart and how well your heart's chambers and valves are working. This procedure takes approximately one hour. There are no restrictions for this procedure.  This will be done at our [facility] location:  [place]: Your physician recommends that you schedule a follow-up appointment in: 2-3 months with Dr. Claiborne Billings.   Any Other Special Instructions Will Be Listed Below (If Applicable).     If you need a refill on your cardiac medications before your next appointment, please call your pharmacy.

## 2017-06-17 NOTE — Telephone Encounter (Signed)
Pt was seen in the office yesterday and all referrals was placed. Pt will come into the office today for fast track labs.

## 2017-06-18 LAB — HEMOGLOBIN A1C
Est. average glucose Bld gHb Est-mCnc: 111 mg/dL
Hgb A1c MFr Bld: 5.5 % (ref 4.8–5.6)

## 2017-06-18 LAB — CBC WITH DIFFERENTIAL/PLATELET
Basophils Absolute: 0 10*3/uL (ref 0.0–0.2)
Basos: 1 %
EOS (ABSOLUTE): 0.1 10*3/uL (ref 0.0–0.4)
Eos: 1 %
Hematocrit: 40.2 % (ref 34.0–46.6)
Hemoglobin: 13.6 g/dL (ref 11.1–15.9)
Immature Grans (Abs): 0 10*3/uL (ref 0.0–0.1)
Immature Granulocytes: 0 %
Lymphocytes Absolute: 2.3 10*3/uL (ref 0.7–3.1)
Lymphs: 29 %
MCH: 30.9 pg (ref 26.6–33.0)
MCHC: 33.8 g/dL (ref 31.5–35.7)
MCV: 91 fL (ref 79–97)
Monocytes Absolute: 0.6 10*3/uL (ref 0.1–0.9)
Monocytes: 8 %
Neutrophils Absolute: 4.8 10*3/uL (ref 1.4–7.0)
Neutrophils: 61 %
Platelets: 247 10*3/uL (ref 150–379)
RBC: 4.4 x10E6/uL (ref 3.77–5.28)
RDW: 14.7 % (ref 12.3–15.4)
WBC: 7.7 10*3/uL (ref 3.4–10.8)

## 2017-06-18 LAB — COMPREHENSIVE METABOLIC PANEL
ALT: 25 IU/L (ref 0–32)
AST: 25 IU/L (ref 0–40)
Albumin/Globulin Ratio: 2.2 (ref 1.2–2.2)
Albumin: 4.4 g/dL (ref 3.6–4.8)
Alkaline Phosphatase: 67 IU/L (ref 39–117)
BUN/Creatinine Ratio: 38 — ABNORMAL HIGH (ref 12–28)
BUN: 23 mg/dL (ref 8–27)
Bilirubin Total: 0.4 mg/dL (ref 0.0–1.2)
CO2: 23 mmol/L (ref 20–29)
Calcium: 9.2 mg/dL (ref 8.7–10.3)
Chloride: 106 mmol/L (ref 96–106)
Creatinine, Ser: 0.6 mg/dL (ref 0.57–1.00)
GFR calc Af Amer: 111 mL/min/{1.73_m2} (ref >59)
GFR calc non Af Amer: 97 mL/min/{1.73_m2} (ref >59)
Globulin, Total: 2 g/dL (ref 1.5–4.5)
Glucose: 75 mg/dL (ref 65–99)
Potassium: 4.2 mmol/L (ref 3.5–5.2)
Sodium: 145 mmol/L — ABNORMAL HIGH (ref 134–144)
Total Protein: 6.4 g/dL (ref 6.0–8.5)

## 2017-06-18 LAB — LIPID PANEL
Chol/HDL Ratio: 4 ratio (ref 0.0–4.4)
Cholesterol, Total: 217 mg/dL — ABNORMAL HIGH (ref 100–199)
HDL: 54 mg/dL (ref >39)
LDL Calculated: 148 mg/dL — ABNORMAL HIGH (ref 0–99)
Triglycerides: 77 mg/dL (ref 0–149)
VLDL Cholesterol Cal: 15 mg/dL (ref 5–40)

## 2017-06-18 LAB — TSH: TSH: 1.3 u[IU]/mL (ref 0.450–4.500)

## 2017-06-20 ENCOUNTER — Ambulatory Visit (INDEPENDENT_AMBULATORY_CARE_PROVIDER_SITE_OTHER): Payer: BLUE CROSS/BLUE SHIELD | Admitting: Podiatry

## 2017-06-20 DIAGNOSIS — L603 Nail dystrophy: Secondary | ICD-10-CM

## 2017-06-20 DIAGNOSIS — B351 Tinea unguium: Secondary | ICD-10-CM

## 2017-06-23 ENCOUNTER — Encounter: Payer: Self-pay | Admitting: Cardiovascular Disease

## 2017-06-25 ENCOUNTER — Telehealth: Payer: Self-pay | Admitting: Family Medicine

## 2017-06-25 ENCOUNTER — Ambulatory Visit: Payer: BLUE CROSS/BLUE SHIELD | Admitting: Family Medicine

## 2017-06-25 NOTE — Telephone Encounter (Signed)
Copied from [place]. Topic: Quick Communication - See Telephone Encounter >> Jun 25, 2017  2:12 PM Clack, Laban Emperor wrote: CRM for notification. See Telephone encounter for:  Pt calling for lab results from [facility] 06/17/17. Please f/u with pt. 06/25/17.

## 2017-06-27 NOTE — Telephone Encounter (Signed)
Please advise 

## 2017-06-29 NOTE — Telephone Encounter (Signed)
Call --- 1. Cholesterol is elevated at 217 with LDL cholesterol of 148; I recommend low-cholesterol food choices.  2. Thyroid function is normal.  3.  Sugar is normal.  4.  No evidence of anemia. 5. Liver and kidney functions are normal.

## 2017-07-01 NOTE — Telephone Encounter (Signed)
Pt advised.

## 2017-07-03 NOTE — Progress Notes (Signed)
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 All safety precautions were in place. RE-appointed in 4 weeks for 2nd treatment

## 2017-07-10 ENCOUNTER — Telehealth: Payer: Self-pay | Admitting: Family Medicine

## 2017-07-10 NOTE — Telephone Encounter (Unsigned)
Copied from [place] [phone]. Topic: Complaint - Billing/Coding >> Jul 10, 2017  1:42 PM Percell Belt A wrote: Patient name/MRN/Acct #: [id number] DOS: 06/16/2017 Details of complaint: pt is stating that this office visit was coded incorrectly.  Some of the blood work was not coded correctly .  She got a bill from [facility] for $37.13 How would the patient like to see it resolved? She would like this recoded  On a scale of 1-10, how was your experience?  What would it take to bring it to a 10?   Will automatically be routed to [place] pool.

## 2017-07-18 ENCOUNTER — Ambulatory Visit: Payer: BLUE CROSS/BLUE SHIELD

## 2017-07-18 DIAGNOSIS — B351 Tinea unguium: Secondary | ICD-10-CM

## 2017-07-18 DIAGNOSIS — L603 Nail dystrophy: Secondary | ICD-10-CM

## 2017-07-22 ENCOUNTER — Ambulatory Visit (HOSPITAL_COMMUNITY): Payer: BLUE CROSS/BLUE SHIELD | Attending: Cardiology

## 2017-07-22 ENCOUNTER — Other Ambulatory Visit: Payer: Self-pay

## 2017-07-22 DIAGNOSIS — I10 Essential (primary) hypertension: Secondary | ICD-10-CM | POA: Diagnosis not present

## 2017-07-22 DIAGNOSIS — R002 Palpitations: Secondary | ICD-10-CM

## 2017-07-22 DIAGNOSIS — R011 Cardiac murmur, unspecified: Secondary | ICD-10-CM | POA: Diagnosis not present

## 2017-07-28 ENCOUNTER — Telehealth: Payer: Self-pay | Admitting: Cardiovascular Disease

## 2017-07-28 NOTE — Progress Notes (Signed)
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 All safety precautions were in place. RE-appointed in 4 weeks for 3rd treatment

## 2017-07-28 NOTE — Telephone Encounter (Signed)
Advised patient, verbalized understanding   Notes recorded by Troy Sine, MD on 07/28/2017 at 6:59 AM EST Normal LV function. Mild MR.

## 2017-07-28 NOTE — Telephone Encounter (Signed)
Pt would like her Echo results from last Tuesday(07-22-17) please.

## 2017-08-04 ENCOUNTER — Ambulatory Visit: Payer: BLUE CROSS/BLUE SHIELD

## 2017-08-08 NOTE — Telephone Encounter (Signed)
Patient calling because after speaking wih Larene Beach who entered Z00.00 code to fix the billing error, [facility] is still saying that she owes $37.13. Please advise.

## 2017-08-15 ENCOUNTER — Encounter: Payer: Self-pay | Admitting: Family Medicine

## 2017-08-22 ENCOUNTER — Ambulatory Visit (INDEPENDENT_AMBULATORY_CARE_PROVIDER_SITE_OTHER): Payer: BLUE CROSS/BLUE SHIELD

## 2017-08-22 ENCOUNTER — Ambulatory Visit: Payer: BLUE CROSS/BLUE SHIELD

## 2017-08-22 ENCOUNTER — Ambulatory Visit
Admission: RE | Admit: 2017-08-22 | Discharge: 2017-08-22 | Disposition: A | Discharge status: Home / Self Care | Payer: BLUE CROSS/BLUE SHIELD | Source: Ambulatory Visit | Attending: Family Medicine | Admitting: Family Medicine

## 2017-08-22 DIAGNOSIS — B351 Tinea unguium: Secondary | ICD-10-CM

## 2017-08-22 DIAGNOSIS — Z1239 Encounter for other screening for malignant neoplasm of breast: Secondary | ICD-10-CM

## 2017-08-22 DIAGNOSIS — L603 Nail dystrophy: Secondary | ICD-10-CM

## 2017-08-22 DIAGNOSIS — Z853 Personal history of malignant neoplasm of breast: Secondary | ICD-10-CM

## 2017-08-22 HISTORY — DX: Personal history of irradiation: Z92.3

## 2017-08-22 HISTORY — DX: Personal history of antineoplastic chemotherapy: Z92.21

## 2017-08-22 NOTE — Progress Notes (Signed)
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 All safety precautions were in place. RE-appointed in 4 weeks for 4th treatment

## 2017-09-08 ENCOUNTER — Ambulatory Visit: Payer: BLUE CROSS/BLUE SHIELD | Admitting: Cardiovascular Disease

## 2017-09-08 ENCOUNTER — Encounter: Payer: Self-pay | Admitting: Cardiovascular Disease

## 2017-09-08 VITALS — BP 108/70 | HR 74 | Ht 64.0 in | Wt 128.0 lb

## 2017-09-08 DIAGNOSIS — E7800 Pure hypercholesterolemia, unspecified: Secondary | ICD-10-CM | POA: Diagnosis not present

## 2017-09-08 DIAGNOSIS — I340 Nonrheumatic mitral (valve) insufficiency: Secondary | ICD-10-CM | POA: Diagnosis not present

## 2017-09-08 DIAGNOSIS — R0789 Other chest pain: Secondary | ICD-10-CM

## 2017-09-08 DIAGNOSIS — R002 Palpitations: Secondary | ICD-10-CM

## 2017-09-08 MED ORDER — METOPROLOL SUCCINATE ER 25 MG PO TB24: 25.0000 mg | ORAL_TABLET | ORAL | 3 refills | Status: DC | PRN

## 2017-09-08 NOTE — Patient Instructions (Signed)
Medication Instructions:  Your physician recommends that you continue on your current medications as directed. Please refer to the Current Medication list given to you today.  Follow-Up: Your physician wants you to follow-up in: 6 months with Dr. Kelly.  You will receive a reminder letter in the mail two months in advance. If you don't receive a letter, please call our office to schedule the follow-up appointment.      If you need a refill on your cardiac medications before your next appointment, please call your pharmacy.   

## 2017-09-08 NOTE — Progress Notes (Signed)
Cardiology Office Note    Date:  09/08/2017   ID:  Cheyenne Sanders, DOB October 18, 1950, MRN [id number]  PCP:  Wardell Honour, MD  Cardiologist:  Shelva Majestic, MD   New cardiology evaluation, referred through the courtesy of Dr. Grant Fontana for evaluation palpitations and atypical chest pain  History of Present Illness:  Cheyenne Sanders is a 67 y.o. female who was referred through the courtesy of Dr. Pamella Pert  with atypical chest pain and intermittent palpitations.  I saw her for initial evaluation on 06/17/2017.  She presents for follow-up evaluation.    Cheyenne Sanders denies any known cardiac history.  She is originally from [place], but moved to [place].  While in [place] in 2016 she had developed an episode of atypical chest pain and underwent a routine treadmill test.  She was told that this was normal.  She has moved back to [place] after her husband's death.   Recently she has noticed some episodes of palpitations which are short-lived.  She also notes a sensation of chest fluttering intermittently and nonexertional chest sensation which often would last 3-4 minutes.  She was recently evaluated at primary care at [facility] with complaints of jaw pain which radiated to her year and down her left arm without associated chest pain.  She denied associated shortness of breath, diaphoresis or nausea.  She was hypertensive.  Her ECG was unremarkable.    When I initially saw her, I recommended she undergo an echo Doppler study which was done on 07/22/2017.  This showed normal systolic function.  Doppler parameters were consistent with high ventricular filling pressure.  There was mild AR.  There was mild focal calcification of the anterior mitral valve leaflet with mild MR and mild PR.  When I saw her, her blood pressure was elevated and I recommended a trial of low-dose metoprolol succinate 25 mg daily.  Apparently, she had a prescription filled, but then never initiated treatment.  When  she checked her blood pressure at home and was in the low-normal range and ultimately never started this.  Her symptoms have completely resolved.  She denies any recent palpitations.  She denies any recurrent chest pain.  Of note, she had undergone laboratory by her primary physician in December 2018 which showed a total cholesterol 217, LDL 148, triglycerides 77, HDL 54.  She was advised to change her diet with plans for subsequent follow-up evaluation.  Past Medical History:  Diagnosis Date  . Breast cancer ([place]) 6811,5726   R breast cancer x 2; lumpectomy with radiation, chemotherapy; mastectomy R.  . Nephrolithiasis   . Personal history of chemotherapy   . Personal history of radiation therapy     Past Surgical History:  Procedure Laterality Date  . ABDOMINAL HYSTERECTOMY  1986   DUB; ovaries intact.  Marland Kitchen BREAST BIOPSY Left 2016  . BREAST SURGERY Right 2014  . cardiac stenting  07/15/1989  . MASTECTOMY Right    2014    Current Medications: Outpatient Medications Prior to Visit  Medication Sig Dispense Refill  . metoprolol succinate (TOPROL XL) 25 MG 24 hr tablet Take 1 tablet (25 mg total) by mouth daily. 90 tablet 3   No facility-administered medications prior to visit.      Allergies:   Fentanyl   Social History   Socioeconomic History  . Marital status: Married    Spouse name: None  . Number of children: None  . Years of education: None  . Highest education level: None  Social Needs  . Financial resource strain: None  . Food insecurity - worry: None  . Food insecurity - inability: None  . Transportation needs - medical: None  . Transportation needs - non-medical: None  Occupational History  . None  Tobacco Use  . Smoking status: Never Smoker  . Smokeless tobacco: Never Used  Substance and Sexual Activity  . Alcohol use: Yes    Alcohol/week: 0.6 oz    Types: 1 Glasses of wine per week  . Drug use: No  . Sexual activity: None  Other Topics Concern  . 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      Tobacco: none      Alcohol: rare; social      Exercise: no formal exercise.        ADLs: independent with ADLs    She is widowed for 4 years.  She lives by herself in [place].  Previously she had worked as a Oceanographer.  There is no tobacco history.  She rarely drinks alcohol.  She does not routinely exercise.  Family History:  [name] family history includes Cancer in her father; Heart disease in her mother.  Her mother had a history of atrial fibrillation and died at age 25.  Father died at age 3 with kidney failure.  There are no siblings.  She has 2 children.  She lives by herself in [place].  ROS General: Negative; No fevers, chills, or night sweats;  HEENT: Negative; No changes in vision or hearing, sinus congestion, difficulty swallowing Pulmonary: Negative; No cough, wheezing, shortness of breath, hemoptysis Cardiovascular: see HPI GI: Negative; No nausea, vomiting, diarrhea, or abdominal pain GU: Negative; No dysuria, hematuria, or difficulty voiding Musculoskeletal: Negative; no myalgias, joint pain, or weakness Hematologic/Oncology: Negative; no easy bruising, bleeding Endocrine: Negative; no heat/cold intolerance; no diabetes Neuro: Negative; no changes in balance, headaches Skin: Negative; No rashes or skin lesions Psychiatric: Negative; No behavioral problems, depression Sleep: Negative; No snoring, daytime sleepiness, hypersomnolence, bruxism, restless legs, hypnogognic hallucinations, no cataplexy Other comprehensive 14 point system review is negative.   PHYSICAL EXAM:   VS:  BP 108/70   Pulse 74   Ht '5\' 4"'  (1.626 m)   Wt 128 lb (58.1 kg)   BMI 21.97 kg/m     Repeat  blood pressure by me was 110/70  Wt Readings from Last 3 Encounters:  09/08/17 128 lb (58.1 kg)  06/17/17 125 lb 12.8 oz (57.1 kg)  06/16/17 126 lb (57.2 kg)    General: Alert, oriented, no distress.  Skin: normal turgor, no rashes, warm and dry HEENT: Normocephalic, atraumatic. Pupils equal round and reactive to light; sclera anicteric; extraocular muscles intact;  Nose without nasal septal hypertrophy Mouth/Parynx benign; Mallinpatti scale 2 Neck: No JVD, no carotid bruits; normal carotid upstroke Lungs: clear to ausculatation and percussion; no wheezing or rales Chest wall: without tenderness to palpitation Heart: PMI not displaced, RRR, s1 s2 normal, 1/6 systolic murmur, no diastolic murmur, no rubs, gallops, thrills, or heaves Abdomen: soft, nontender; no hepatosplenomehaly, BS+; abdominal aorta nontender and not dilated by palpation. Back: no CVA tenderness Pulses 2+ Musculoskeletal: full range of motion, normal strength, no joint deformities Extremities: no  clubbing cyanosis or edema, Homan's sign negative  Neurologic: grossly nonfocal; Cranial nerves grossly wnl Psychologic: Normal mood and affect   Studies/Labs Reviewed:   EKG:  EKG is ordered today. ECG (independently read by me): Normal sinus rhythm at 74 bpm.  Possible left atrial enlargement.  Normal intervals.  No ectopy.  December 2018 ECG (independently read by me): Sinus rhythm at 72 bpm with occasional PVC.  Nonspecific ST changes.  QTc interval 468 Cheyenne.  PR interval 132 Cheyenne.  Recent Labs: BMP Latest Ref Rng & Units 06/17/2017 08/27/2016  Glucose 65 - 99 mg/dL 75 79  BUN 8 - 27 mg/dL 23 21  Creatinine 0.57 - 1.00 mg/dL 0.60 0.65  BUN/Creat Ratio 12 - 28 38(H) 32(H)  Sodium 134 - 144 mmol/L 145(H) 144  Potassium 3.5 - 5.2 mmol/L 4.2 4.5  Chloride 96 - 106 mmol/L 106 102  CO2 20 - 29 mmol/L 23 24  Calcium 8.7 - 10.3 mg/dL 9.2 10.0     Hepatic Function Latest Ref Rng & Units 06/17/2017 08/27/2016  Total Protein  6.0 - 8.5 g/dL 6.4 6.9  Albumin 3.6 - 4.8 g/dL 4.4 4.6  AST 0 - 40 IU/L 25 21  ALT 0 - 32 IU/L 25 27  Alk Phosphatase 39 - 117 IU/L 67 64  Total Bilirubin 0.0 - 1.2 mg/dL 0.4 0.5    CBC Latest Ref Rng & Units 06/17/2017 08/27/2016  WBC 3.4 - 10.8 x10E3/uL 7.7 7.4  Hemoglobin 11.1 - 15.9 g/dL 13.6 14.2  Hematocrit 34.0 - 46.6 % 40.2 47.1(H)  Platelets 150 - 379 x10E3/uL 247 259   Lab Results  Component Value Date   MCV 91 06/17/2017   MCV 102 (H) 08/27/2016   Lab Results  Component Value Date   TSH 1.300 06/17/2017   Lab Results  Component Value Date   HGBA1C 5.5 06/17/2017     BNP No results found for: BNP  ProBNP No results found for: PROBNP   Lipid Panel     Component Value Date/Time   CHOL 217 (H) 06/17/2017 1342   TRIG 77 06/17/2017 1342   HDL 54 06/17/2017 1342   CHOLHDL 4.0 06/17/2017 1342   LDLCALC 148 (H) 06/17/2017 1342     RADIOLOGY: Mm Screening Breast Tomo Uni L  Result Date: 08/22/2017 CLINICAL DATA:  Screening. History of treated right breast cancer, status post mastectomy. EXAM: DIGITAL SCREENING UNILATERAL LEFT MAMMOGRAM WITH CAD AND TOMO COMPARISON:  Previous exam(s). ACR Breast Density Category c: The breast tissue is heterogeneously dense, which may obscure small masses. FINDINGS: The patient has had a right mastectomy. There are no findings suspicious for malignancy. Images were processed with CAD. IMPRESSION: No mammographic evidence of malignancy. A result letter of this screening mammogram will be mailed directly to the patient. RECOMMENDATION: Screening mammogram in one year.  (Code:SM-L-88M) BI-RADS CATEGORY  1: Negative. Electronically Signed   By: Fidela Salisbury M.D.   On: 08/22/2017 16:47     Additional studies/ records that were reviewed today include:  Reviewed the records from [place]:    1. Palpitations   2. Atypical chest pain   3. Pure hypercholesterolemia   4. Mild mitral regurgitation      PLAN:    Cheyenne Sanders is a very pleasant 67 year old widowed female who had a history of remote atypical chest pain and underwent a routine treadmill test in 2016 while living in [place].  This apparently was normal.  She recently has noted had noticed  an episode of left jaw discomfort radiating to her ear and down her left arm.  .  She denies any history of exertionally precipitated episodes of chest tightness and admitted to an occasional episode of palpitations.  When I initially saw her, her blood pressure was mildly elevated and with her complaints of palpitations elected to initiate low-dose metoprolol succinate 25 mg.  Apparently, her symptoms completely resolved.  She never took the medication.  Her blood pressure today is significantly better and 110/70 on repeat by me.  As result, I recommended that she not take the metoprolol but continue to have this if recurrent palpitations I develop.  I also reviewed laboratory by her primary physician, Dr. Ronelle Nigh.  At present, she is not on lipid-lowering therapy, but LDL was 148.  If LDL continues to be significantly elevated initiation of treatment should be undertaken.  I will see her in 6 months for reevaluation, and prior to that evaluation she will be undergoing repeat laboratory with her primary physician.    Medication Adjustments/Labs and Tests Ordered: Current medicines are reviewed at length with the patient today.  Concerns regarding medicines are outlined above.  Medication changes, Labs and Tests ordered today are listed in the Patient Instructions below. Patient Instructions  Medication Instructions:  Your physician recommends that you continue on your current medications as directed. Please refer to the Current Medication list given to you today.  Follow-Up: Your physician wants you to follow-up in: 6 months with Dr. Claiborne Billings. You will receive a reminder letter in the mail two months in advance. If you don't receive a letter, please call our  office to schedule the follow-up appointment.   If you need a refill on your cardiac medications before your next appointment, please call your pharmacy.      Signed, Shelva Majestic, MD  09/08/2017 7:08 PM    [facility] [address] [place], [place], [place]  [address] Phone: [phone]

## 2017-09-19 ENCOUNTER — Ambulatory Visit: Payer: Self-pay

## 2017-09-19 DIAGNOSIS — B351 Tinea unguium: Secondary | ICD-10-CM

## 2017-09-19 DIAGNOSIS — L603 Nail dystrophy: Secondary | ICD-10-CM

## 2017-09-19 MED ORDER — NONFORMULARY OR COMPOUNDED ITEM: 1.0000 g | Freq: Every day | 11 refills | Status: DC

## 2017-09-19 NOTE — Progress Notes (Signed)
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 All safety precautions were in place. RE-appointed in 4 weeks for 4th treatment. Rx for [employer] was written and faxed to pharmacy

## 2017-10-17 ENCOUNTER — Other Ambulatory Visit: Payer: BLUE CROSS/BLUE SHIELD

## 2017-10-21 ENCOUNTER — Ambulatory Visit: Payer: Self-pay

## 2017-10-21 DIAGNOSIS — L603 Nail dystrophy: Secondary | ICD-10-CM

## 2017-10-21 DIAGNOSIS — B351 Tinea unguium: Secondary | ICD-10-CM

## 2017-10-22 NOTE — Progress Notes (Signed)
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 All safety precautions were in place. RE-appointed in 3 months to follow up with Dr Milinda Pointer to monitor progression of nails. She is to use the topical until re-evaluation

## 2017-11-25 ENCOUNTER — Ambulatory Visit: Payer: BLUE CROSS/BLUE SHIELD | Admitting: Emergency Medicine

## 2017-11-25 ENCOUNTER — Other Ambulatory Visit: Payer: Self-pay

## 2017-11-25 ENCOUNTER — Encounter: Payer: Self-pay | Admitting: Emergency Medicine

## 2017-11-25 VITALS — BP 130/84 | HR 67 | Temp 97.8°F | Resp 16 | Ht 63.5 in | Wt 122.2 lb

## 2017-11-25 DIAGNOSIS — L089 Local infection of the skin and subcutaneous tissue, unspecified: Secondary | ICD-10-CM

## 2017-11-25 DIAGNOSIS — S0096XA Insect bite (nonvenomous) of unspecified part of head, initial encounter: Secondary | ICD-10-CM | POA: Insufficient documentation

## 2017-11-25 DIAGNOSIS — W57XXXA Bitten or stung by nonvenomous insect and other nonvenomous arthropods, initial encounter: Secondary | ICD-10-CM

## 2017-11-25 HISTORY — DX: Local infection of the skin and subcutaneous tissue, unspecified: L08.9

## 2017-11-25 HISTORY — DX: Bitten or stung by nonvenomous insect and other nonvenomous arthropods, initial encounter: W57.XXXA

## 2017-11-25 HISTORY — DX: Insect bite (nonvenomous) of unspecified part of head, initial encounter: S00.96XA

## 2017-11-25 MED ORDER — DOXYCYCLINE HYCLATE 100 MG PO TABS: 100.0000 mg | ORAL_TABLET | Freq: Two times a day (BID) | ORAL | 0 refills | Status: DC

## 2017-11-25 NOTE — Patient Instructions (Addendum)
     IF you received an x-ray today, you will receive an invoice from [facility]. Please contact [facility] at [phone] with questions or concerns regarding your invoice.   IF you received labwork today, you will receive an invoice from [facility]. Please contact [facility] at [phone] with questions or concerns regarding your invoice.   Our billing staff will not be able to assist you with questions regarding bills from these companies.  You will be contacted with the lab results as soon as they are available. The fastest way to get your results is to activate your My Chart account. Instructions are located on the last page of this paperwork. If you have not heard from us regarding the results in 2 weeks, please contact this office.      Cellulitis, Adult Cellulitis is a skin infection. The infected area is usually red and sore. This condition occurs most often in the arms and lower legs. It is very important to get treated for this condition. Follow these instructions at home:  Take over-the-counter and prescription medicines only as told by your doctor.  If you were prescribed an antibiotic medicine, take it as told by your doctor. Do not stop taking the antibiotic even if you start to feel better.  Drink enough fluid to keep your pee (urine) clear or pale yellow.  Do not touch or rub the infected area.  Raise (elevate) the infected area above the level of your heart while you are sitting or lying down.  Place warm or cold wet cloths (warm or cold compresses) on the infected area. Do this as told by your doctor.  Keep all follow-up visits as told by your doctor. This is important. These visits let your doctor make sure your infection is not getting worse. Contact a doctor if:  You have a fever.  Your symptoms do not get better after 1-2 days of treatment.  Your bone or joint under the infected area starts to hurt after the skin has healed.  Your  infection comes back. This can happen in the same area or another area.  You have a swollen bump in the infected area.  You have new symptoms.  You feel ill and also have muscle aches and pains. Get help right away if:  Your symptoms get worse.  You feel very sleepy.  You throw up (vomit) or have watery poop (diarrhea) for a long time.  There are red streaks coming from the infected area.  Your red area gets larger.  Your red area turns darker. This information is not intended to replace advice given to you by your health care provider. Make sure you discuss any questions you have with your health care provider. Document Released: 12/18/2007 Document Revised: 12/07/2015 Document Reviewed: 05/10/2015 Elsevier Interactive Patient Education  2018 Elsevier Inc.  

## 2017-11-25 NOTE — Progress Notes (Signed)
Cheyenne Sanders 67 y.o.   Chief Complaint  Patient presents with  . Tick Removal    RIGHT side on 11/18/17 patient pulled it off - redness    HISTORY OF PRESENT ILLNESS: This is a 67 y.o. female complaining of skin redness on the right  flank area site of a tick bite sustained 11/18/2017.  Area growing in redness.  No other significant symptoms.  HPI   Prior to Admission medications   Medication Sig Start Date End Date Taking? Authorizing Provider  metoprolol succinate (TOPROL XL) 25 MG 24 hr tablet Take 1 tablet (25 mg total) by mouth as needed. Patient not taking: Reported on 11/25/2017 09/08/17   Troy Sine, MD  NONFORMULARY OR COMPOUNDED ITEM Apply 1-2 g topically daily. [employer] Nail lacquer: Fluconazole 2%, Terbinafine 1%, DMSO 09/19/17   Hyatt, Max T, DPM    Allergies  Allergen Reactions  . Fentanyl Shortness Of Breath    Patient Active Problem List   Diagnosis Date Noted  . Flank pain 09/06/2016  . Recurrent nephrolithiasis 08/27/2016  . Personal history of breast cancer 08/27/2016  . Coronary artery disease involving native coronary artery of native heart without angina pectoris 06/28/2015  . Essential hypertension 06/28/2015  . Mixed hyperlipidemia 06/28/2015  . Precordial chest pain 06/28/2015  . Status post coronary artery stent placement 06/28/2015    Past Medical History:  Diagnosis Date  . Breast cancer ([place]) [id number]   R breast cancer x 2; lumpectomy with radiation, chemotherapy; mastectomy R.  . Nephrolithiasis   . Personal history of chemotherapy   . Personal history of radiation therapy     Past Surgical History:  Procedure Laterality Date  . ABDOMINAL HYSTERECTOMY  1986   DUB; ovaries intact.  Marland Kitchen BREAST BIOPSY Left 2016  . BREAST SURGERY Right 2014  . cardiac stenting  07/15/1989  . MASTECTOMY Right    2014    Social History   Socioeconomic History  . Marital status: Married    Spouse name: Not on file  . Number of children: Not on  file  . Years of education: Not on file  . Highest education level: Not on file  Occupational History  . Not on file  Social Needs  . Financial resource strain: Not on file  . Food insecurity:    Worry: Not on file    Inability: Not on file  . Transportation needs:    Medical: Not on file    Non-medical: Not on file  Tobacco Use  . Smoking status: Never Smoker  . Smokeless tobacco: Never Used  Substance and Sexual Activity  . Alcohol use: Yes    Alcohol/week: 0.6 oz    Types: 1 Glasses of wine per week  . Drug use: No  . Sexual activity: Not on file  Lifestyle  . Physical activity:    Days per week: Not on file    Minutes per session: Not on file  . Stress: Not on file  Relationships  . Social connections:    Talks on phone: Not on file    Gets together: Not on file    Attends religious service: Not on file    Active member of club or organization: Not on file    Attends meetings of clubs or organizations: Not on file    Relationship status: Not on file  . Intimate partner violence:    Fear of current or ex partner: Not on file    Emotionally abused: Not on file  Physically abused: Not on file    Forced sexual activity: Not on file  Other Topics Concern  . 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      Tobacco: none      Alcohol: rare; social      Exercise: no formal exercise.        ADLs: independent with ADLs    Family History  Problem Relation Age of Onset  . Heart disease Mother   . Cancer Father   . Breast cancer Neg Hx      Review of Systems  Constitutional: Negative.  Negative for chills and fever.  HENT: Negative.  Negative for sore throat.   Respiratory: Negative for shortness of breath.   Cardiovascular:  Negative for chest pain and palpitations.  Gastrointestinal: Negative for nausea and vomiting.  Musculoskeletal: Negative for back pain, myalgias and neck pain.  Skin: Positive for itching and rash.  Neurological: Negative for dizziness and headaches.  All other systems reviewed and are negative.  Vitals:   11/25/17 1056  BP: 130/84  Pulse: 67  Resp: 16  Temp: 97.8 F (36.6 C)  SpO2: 98%     Physical Exam  Constitutional: She is oriented to person, place, and time. She appears well-developed and well-nourished.  HENT:  Head: Normocephalic and atraumatic.  Eyes: Pupils are equal, round, and reactive to light.  Neck: Normal range of motion.  Cardiovascular: Normal rate.  Pulmonary/Chest: Effort normal.  Musculoskeletal: Normal range of motion.  Neurological: She is alert and oriented to person, place, and time.  Skin: Capillary refill takes less than 2 seconds. Rash (See picture below) noted.  Psychiatric: She has a normal mood and affect. Her behavior is normal.  Vitals reviewed.   .   A total of 25 minutes was spent in the room with the patient, greater than 50% of which was in counseling/coordination of care regarding differential diagnosis, treatment, antibiotics, and need for follow-up if worse.  ASSESSMENT & PLAN: Ahsley was seen today for tick removal.  Diagnoses and all orders for this visit:  Skin infection -     Discontinue: doxycycline (VIBRA-TABS) 100 MG tablet; Take 1 tablet (100 mg total) by mouth 2 (two) times daily. -     doxycycline (VIBRA-TABS) 100 MG tablet; Take 1 tablet (100 mg total) by mouth 2 (two) times daily.  Tick bite, initial encounter    Patient Instructions       IF you received an x-ray today, you will receive an invoice from [facility]. Please contact [facility] at [phone] with questions or concerns regarding your invoice.   IF you received labwork today, you will receive an invoice from [facility]. Please  contact [facility] at [phone] with questions or concerns regarding your invoice.   Our billing staff will not be able to assist you with questions regarding bills from these companies.  You will be contacted with the lab results as soon as they are available. The fastest way to get your results is to activate your My Chart account. Instructions are located on the last page of this paperwork. If you have not heard from [place] regarding the results in 2 weeks, please contact this office.  Cellulitis, Adult Cellulitis is a skin infection. The infected area is usually red and sore. This condition occurs most often in the arms and lower legs. It is very important to get treated for this condition. Follow these instructions at home:  Take over-the-counter and prescription medicines only as told by your doctor.  If you were prescribed an antibiotic medicine, take it as told by your doctor. Do not stop taking the antibiotic even if you start to feel better.  Drink enough fluid to keep your pee (urine) clear or pale yellow.  Do not touch or rub the infected area.  Raise (elevate) the infected area above the level of your heart while you are sitting or lying down.  Place warm or cold wet cloths (warm or cold compresses) on the infected area. Do this as told by your doctor.  Keep all follow-up visits as told by your doctor. This is important. These visits let your doctor make sure your infection is not getting worse. Contact a doctor if:  You have a fever.  Your symptoms do not get better after 1-2 days of treatment.  Your bone or joint under the infected area starts to hurt after the skin has healed.  Your infection comes back. This can happen in the same area or another area.  You have a swollen bump in the infected area.  You have new symptoms.  You feel ill and also have muscle aches and pains. Get help right away if:  Your symptoms get worse.  You feel very sleepy.  You  throw up (vomit) or have watery poop (diarrhea) for a long time.  There are red streaks coming from the infected area.  Your red area gets larger.  Your red area turns darker. This information is not intended to replace advice given to you by your health care provider. Make sure you discuss any questions you have with your health care provider. Document Released: 12/18/2007 Document Revised: 12/07/2015 Document Reviewed: 05/10/2015 Elsevier Interactive Patient Education  2018 Elsevier Inc.      Agustina Caroli, MD [facility]

## 2017-12-09 ENCOUNTER — Encounter: Payer: Self-pay | Admitting: Family Medicine

## 2017-12-15 ENCOUNTER — Ambulatory Visit: Payer: Self-pay

## 2017-12-15 ENCOUNTER — Ambulatory Visit: Payer: BLUE CROSS/BLUE SHIELD | Admitting: Family Medicine

## 2017-12-15 ENCOUNTER — Encounter: Payer: Self-pay | Admitting: Family Medicine

## 2017-12-15 ENCOUNTER — Other Ambulatory Visit: Payer: Self-pay

## 2017-12-15 VITALS — BP 138/70 | HR 65 | Temp 97.6°F | Ht 64.0 in | Wt 124.4 lb

## 2017-12-15 DIAGNOSIS — W19XXXA Unspecified fall, initial encounter: Secondary | ICD-10-CM | POA: Diagnosis not present

## 2017-12-15 DIAGNOSIS — S8000XA Contusion of unspecified knee, initial encounter: Secondary | ICD-10-CM | POA: Diagnosis not present

## 2017-12-15 DIAGNOSIS — S0003XA Contusion of scalp, initial encounter: Secondary | ICD-10-CM | POA: Diagnosis not present

## 2017-12-15 DIAGNOSIS — S60221A Contusion of right hand, initial encounter: Secondary | ICD-10-CM | POA: Diagnosis not present

## 2017-12-15 NOTE — Telephone Encounter (Signed)
Pt. Reported she fell this AM about 10:30 and his upper right forehead, in hairline, on a wrought iron bench.  Reported the fall was caused by tripping on uneven cement. Denied break in skin of scalp.  Reported there is mild redness and slight swelling, as reported by pt's. son.  Stated she did not lose consciousness.  Reported being alert to person, place and time at time of injury.  Alert at this time; able to articulate her thoughts and clearly relay the details surrounding the fall.  Denied any blurring of vision.  Denied headache.  Denied any dizziness, nausea or vomiting.  Reported she mildly scraped her knees; denied bleeding.  Reported a bruise on right knee.  Denied feeling groggy at this time.  Pt. Currently with her son.  Appt. Given for 3:40 PM today, for evaluation.  Advised to call back or have her son call back if symptoms worsen.  Verb. Understanding.  Agrees with plan.      Reason for Disposition . Scalp swelling, bruise or pain    Plan to schedule pt. For office evaluation due to hitting upper forehead on wrought iron bench; initial description is mild redness and slight swelling at site; denied break in skin.  Stated also scraped knees  Answer Assessment - Initial Assessment Questions 1. MECHANISM: "How did the injury happen?" For falls, ask: "What height did you fall from?" and "What surface did you fall against?"      Tripped on uneven cement ; hit head on wrought iron bench 2. ONSET: "When did the injury happen?" (Minutes or hours ago)      10:30 AM  3. NEUROLOGIC SYMPTOMS: "Was there any loss of consciousness?" "Are there any other neurological symptoms?"      Denied loss of consciousness; no confusion; reported alert to the people and situation around her.  4. MENTAL STATUS: "Does the person know who he is, who you are, and where he is?"      alert 5. LOCATION: "What part of the head was hit?"      Right upper forehead at hairline  6. SCALP APPEARANCE: "What does the scalp  look like? Is it bleeding now?" If so, ask: "Is it difficult to stop?"      Slight redness; slight swelling;  no break in skin 7. SIZE: For cuts, bruises, or swelling, ask: "How large is it?" (e.g., inches or centimeters)     N/a 8. PAIN: "Is there any pain?" If so, ask: "How bad is it?"  (e.g., Scale 1-10; or mild, moderate, severe)    Denied pain 9. TETANUS: For any breaks in the skin, ask: "When was the last tetanus booster?"     unsure 10. OTHER SYMPTOMS: "Do you have any other symptoms?" (e.g., neck pain, vomiting)       Denied nausea or vomiting; scraped her right and left knee; bruise on right knee not bleeding  Protocols used: HEAD INJURY-A-AH

## 2017-12-15 NOTE — Progress Notes (Signed)
Subjective:  By signing my name below, I, Cheyenne Sanders, attest that this documentation has been prepared under the direction and in the presence of Cheyenne Ray, MD. Electronically Signed: Moises Sanders, [place]. 12/15/2017 , 4:28 PM .  Patient was seen in Room 10 .   Patient ID: Cheyenne Sanders, female    DOB: Jul 21, 1950, 67 y.o.   MRN: [id number] Chief Complaint  Patient presents with  . Fall    today around 1030 am. Get checked out. Knees banged up. Hit head on handrail   HPI Cheyenne Sanders is a 67 y.o. female  Patient was in the middle of moving today, and while watching the movers, she tripped over cement around 10:30 AM this morning. She landed on her knees, and also her right wrist forward. During her fall, she hit her head onto a park bench iron handle corner. She denies bleeding, LOC, syncope, vomiting, blurred vision, weakness in upper extremities, headache, dizziness or lightheadedness. She takes aspirin 81 mg qd, but didn't take any yesterday or today. She had occasional swelling in her right wrist prior to today's injury. Patient denies history of stents. She's cleaned small abrasions over her knees with soap and water. She is right hand dominant.   Patient Active Problem List   Diagnosis Date Noted  . Skin infection 11/25/2017  . Tick bite 11/25/2017  . Flank pain 09/06/2016  . Recurrent nephrolithiasis 08/27/2016  . Personal history of breast cancer 08/27/2016  . Coronary artery disease involving native coronary artery of native heart without angina pectoris 06/28/2015  . Essential hypertension 06/28/2015  . Mixed hyperlipidemia 06/28/2015  . Precordial chest pain 06/28/2015  . Status post coronary artery stent placement 06/28/2015   Past Medical History:  Diagnosis Date  . Breast cancer ([place]) [id number]   R breast cancer x 2; lumpectomy with radiation, chemotherapy; mastectomy R.  . Nephrolithiasis   . Personal history of chemotherapy   . Personal history  of radiation therapy    Past Surgical History:  Procedure Laterality Date  . ABDOMINAL HYSTERECTOMY  1986   DUB; ovaries intact.  Marland Kitchen BREAST BIOPSY Left 2016  . BREAST SURGERY Right 2014  . cardiac stenting  07/15/1989  . MASTECTOMY Right    2014   Allergies  Allergen Reactions  . Fentanyl Shortness Of Breath   Prior to Admission medications   Medication Sig Start Date End Date Taking? Authorizing Provider  doxycycline (VIBRA-TABS) 100 MG tablet Take 1 tablet (100 mg total) by mouth 2 (two) times daily. 11/25/17   Horald Pollen, MD  metoprolol succinate (TOPROL XL) 25 MG 24 hr tablet Take 1 tablet (25 mg total) by mouth as needed. Patient not taking: Reported on 11/25/2017 09/08/17   Troy Sine, MD  NONFORMULARY OR COMPOUNDED ITEM Apply 1-2 g topically daily. [employer] Nail lacquer: Fluconazole 2%, Terbinafine 1%, DMSO 09/19/17   Hyatt, Max T, DPM   Social History   Socioeconomic History  . Marital status: Married    Spouse name: Not on file  . Number of children: Not on file  . Years of education: Not on file  . Highest education level: Not on file  Occupational History  . Not on file  Social Needs  . Financial resource strain: Not on file  . Food insecurity:    Worry: Not on file    Inability: Not on file  . Transportation needs:    Medical: Not on file    Non-medical: Not on file  Tobacco  Use  . Smoking status: Never Smoker  . Smokeless tobacco: Never Used  Substance and Sexual Activity  . Alcohol use: Yes    Alcohol/week: 0.6 oz    Types: 1 Glasses of wine per week  . Drug use: No  . Sexual activity: Not on file  Lifestyle  . Physical activity:    Days per week: Not on file    Minutes per session: Not on file  . Stress: Not on file  Relationships  . Social connections:    Talks on phone: Not on file    Gets together: Not on file    Attends religious service: Not on file    Active member of club or organization: Not on file    Attends meetings of  clubs or organizations: Not on file    Relationship status: Not on file  . Intimate partner violence:    Fear of current or ex partner: Not on file    Emotionally abused: Not on file    Physically abused: Not on file    Forced sexual activity: Not on file  Other Topics Concern  . 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      Tobacco: none      Alcohol: rare; social      Exercise: no formal exercise.        ADLs: independent with ADLs   Review of Systems  Constitutional: Negative for chills, fatigue, fever and unexpected weight change.  HENT: Negative for facial swelling.   Eyes: Negative for visual disturbance.  Respiratory: Negative for cough.   Gastrointestinal: Negative for constipation, diarrhea, nausea and vomiting.  Musculoskeletal: Positive for arthralgias. Negative for joint swelling.  Skin: Positive for wound. Negative for rash.  Neurological: Negative for dizziness, syncope, facial asymmetry, weakness, light-headedness, numbness and headaches.       Objective:   Physical Exam  Constitutional: She is oriented to person, place, and time. She appears well-developed and well-nourished. No distress.  HENT:  Head: Normocephalic and atraumatic. Head is without raccoon's eyes, without abrasion, without laceration, without right periorbital erythema and without left periorbital erythema.  Right Ear: No hemotympanum.  Left Ear: No hemotympanum.  Nose: Right sinus exhibits no maxillary sinus tenderness and no frontal sinus tenderness. Left sinus exhibits no maxillary sinus tenderness and no frontal sinus tenderness.  Head: no abrasion, skin intact, no erythema, minimal soft tissue swelling over right frontal scalp; temporal scalp non tender,  no malocclusion, jaw pain free  Left ear: moderate cerumen, visualized TM no hemotympanum Right ear: visualized TM no hemotympanum  Eyes: Pupils are equal, round, and reactive to light. Conjunctivae and EOM are normal. Right eye exhibits normal extraocular motion and no nystagmus. Left eye exhibits normal extraocular motion and no nystagmus.  Neck: Neck supple.  Cardiovascular: Normal rate.  Pulmonary/Chest: Effort normal. No respiratory distress.  Musculoskeletal: Normal range of motion.  Right elbow: pain free ROM, radius non tender Right forearm: distal radius non tender Right wrist: distal scaphoid non tender, minimal tenderness of thenar eminence, no significant soft tissue swelling, full flexion and extension of the right thumb, remainder of hand is non tender Knees: 2 small superficial abrasions over her knees; pain  free ROM, very faint ecchymosis right anterior knee, no focal bony tenderness of patella, normal resisted extension, knees bilaterally no effusion  Neurological: She is alert and oriented to person, place, and time.  Skin: Skin is warm and dry.  Psychiatric: She has a normal mood and affect. Her behavior is normal.  Nursing note and vitals reviewed.   Vitals:   12/15/17 1543  BP: 138/70  Pulse: 65  Temp: 97.6 F (36.4 C)  TempSrc: Oral  SpO2: 99%  Weight: 124 lb 6.4 oz (56.4 kg)  Height: 5\' 4"  (1.626 m)       Assessment & Plan:    YIANNA TERSIGNI is a 67 y.o. female Contusion of scalp, initial encounter  Contusion of knee, unspecified laterality, initial encounter  Contusion of right hand, initial encounter  Fall, initial encounter Status post mechanical fall at home.  No syncope.  No concerning findings on history or exam with Suspected contusion of scalp, knee and right hand.  No concerning bony tenderness, and discussed unlikely fracture.  Decided against imaging at this time.  Symptomatic care discussed with Tylenol, handout given on contusions and  head injuries, with ER/RTC precautions discussed.  Understanding expressed. No orders of the defined types were placed in this encounter.  Patient Instructions    Based on your current exam I would not recommend a CT scan of the head or other imaging for the bones at this time.  Tylenol over-the-counter as needed.  See information on contusions and head injuries below but if any worsening of symptoms as we discussed I would recommend recheck here or the emergency room if needed.   Contusion A contusion is a deep bruise. Contusions are the result of a blunt injury to tissues and muscle fibers under the skin. The injury causes bleeding under the skin. The skin overlying the contusion may turn blue, purple, or yellow. Minor injuries will give you a painless contusion, but more severe contusions may stay painful and swollen for a few weeks. What are the causes? This condition is usually caused by a blow, trauma, or direct force to an area of the body. What are the signs or symptoms? Symptoms of this condition include:  Swelling of the injured area.  Pain and tenderness in the injured area.  Discoloration. The area may have redness and then turn blue, purple, or yellow.  How is this diagnosed? This condition is diagnosed based on a physical exam and medical history. An X-Sanders, CT scan, or MRI may be needed to determine if there are any associated injuries, such as broken bones (fractures). How is this treated? Specific treatment for this condition depends on what area of the body was injured. In general, the best treatment for a contusion is resting, icing, applying pressure to (compression), and elevating the injured area. This is often called the RICE strategy. Over-the-counter anti-inflammatory medicines may also be recommended for pain control. Follow these instructions at home:  Rest the injured area.  If directed, apply ice to the injured area: ? Put ice in a plastic bag. ? Place a  towel between your skin and the bag. ? Leave the ice on for 20 minutes, 2-3 times per day.  If directed, apply light compression to the injured area using an elastic bandage. Make sure the bandage is not wrapped too tightly. Remove and reapply the bandage as directed by your health care provider.  If possible, raise (elevate) the injured area above the level of your heart while you are  sitting or lying down.  Take over-the-counter and prescription medicines only as told by your health care provider. Contact a health care provider if:  Your symptoms do not improve after several days of treatment.  Your symptoms get worse.  You have difficulty moving the injured area. Get help right away if:  You have severe pain.  You have numbness in a hand or foot.  Your hand or foot turns pale or cold. This information is not intended to replace advice given to you by your health care provider. Make sure you discuss any questions you have with your health care provider. Document Released: 04/10/2005 Document Revised: 11/09/2015 Document Reviewed: 11/16/2014 Elsevier Interactive Patient Education  2018 [employer].    Facial or Scalp Contusion A facial or scalp contusion is a deep bruise (contusion) on the face or head. Injuries to the face and head generally cause a lot of swelling, especially around the eyes. Contusions are the result of an injury that caused bleeding under the skin. The contusion may turn blue, purple, or yellow. Minor injuries will give you a painless contusion, but more severe contusions may stay painful and swollen for a few weeks. What are the causes? A facial or scalp contusion is caused by a blunt injury, fall, or trauma to the face or head area. What are the signs or symptoms? Symptoms of this condition include:  Swelling of the injured area.  Discoloration of the injured area.  Tenderness, soreness, or pain in the injured area.  How is this diagnosed? This  condition is diagnosed based on your medical history and a physical exam. An X-Sanders exam, CT scan, or MRI may be needed to check for any additional injuries, such as broken bones (fractures). How is this treated? Often, the best treatment for a facial or scalp contusion is applying cold compresses to the injured area. Over-the-counter medicines may also be recommended for pain control. Follow these instructions at home:  Take over-the-counter and prescription medicines only as told by your health care provider.  If directed, apply ice to the injured area. ? Put ice in a plastic bag. ? Place a towel between your skin and the bag. ? Leave the ice on for 20 minutes, 2-3 times a day.  Keep all follow-up visits as told by your health care provider. This is important. Contact a health care provider if:  You have trouble biting or chewing.  Your pain or swelling gets worse.  You have pain when you move your eyes. Get help right away if:  You have severe pain or a headache that is not relieved by medicine.  You have unusual sleepiness, confusion, or personality changes.  You vomit.  You have a nosebleed that does not stop.  You have double vision or blurred vision.  You have a continuous clear fluid draining from your nose or ear.  You have trouble walking or using your arms or legs.  You have severe dizziness. Summary  A facial or scalp contusion is a deep bruise (contusion) on the face or head.  Contusions are the result of an injury that caused bleeding under the skin.  Minor injuries will give you a painless contusion, but more severe contusions may stay painful and swollen for a few weeks.  Often, the best treatment for a facial or scalp contusion is applying cold compresses to the injured area. This information is not intended to replace advice given to you by your health care provider. Make sure you discuss any  questions you have with your health care provider. Document  Released: 08/08/2004 Document Revised: 05/21/2016 Document Reviewed: 05/21/2016 Elsevier Interactive Patient Education  2017 [place] Injury, Adult There are many types of head injuries. Head injuries can be as minor as a bump, or they can be more severe. More severe head injuries include:  A jarring injury to the brain (concussion).  A bruise of the brain (contusion). This means there is bleeding in the brain that can cause swelling.  A cracked skull (skull fracture).  Bleeding in the brain that collects, clots, and forms a bump (hematoma).  After a head injury, you may need to be observed for a while in the emergency department or urgent care. Sometimes admission to the hospital is needed. After a head injury has happened, most problems occur within the first 24 hours, but side effects may occur up to 7-10 days after the injury. It is important to watch your condition for any changes. What are the causes? There are many possible causes of a head injury. A serious head injury may happen to someone who is in a car accident (motor vehicle collision). Other causes of major head injuries include bicycle or motorcycle accidents, sports injuries, and falls. Risk factors This condition is more likely to occur in people who:  Drink a lot of alcohol or use drugs.  Are over the age of 68.  Are at risk for falls.  What are the symptoms? There are many possible symptoms of a head injury. Visible symptoms of a head injury include a bruise, bump, or bleeding at the site of the injury. Other non-visible symptoms include:  Feeling sleepy or not being able to stay awake.  Passing out.  Headache.  Seizures.  Dizziness.  Confusion.  Memory problems.  Nausea or vomiting.  Other possible symptoms that may develop after the head injury include:  Poor attention and concentration.  Fatigue or tiring easily.  Irritability.  Being uncomfortable around bright lights or loud  noises.  Anxiety or depression.  Disturbed sleep.  How is this diagnosed? This condition can usually be diagnosed based on your symptoms, a description of the injury, and a physical exam. You may also have imaging tests done, such as a CT scan or MRI. You will also be closely watched. How is this treated? Treatment for this condition depends on the severity and type of injury you have. The main goal of treatment is to prevent complications and allow the brain time to heal. For mild head injury, you may be sent home and treatment may include:  Observation. A responsible adult should stay with you for 24 hours after your injury and check on you often.  Physical rest.  Brain rest.  Pain medicines.  For severe brain injury, treatment may include:  Close observation. This includes hospitalization with frequent physical exams. You may need to go to a hospital that specializes in head injury.  Pain medicines.  Breathing support. This may include using a ventilator.  Managing the pressure inside the brain (intracranial pressure, or ICP). This may include: ? Monitoring the ICP. ? Giving medicines to decrease the ICP. ? Positioning you to decrease the ICP.  Medicine to prevent seizures.  Surgery to stop bleeding or to remove Sanders clots (craniotomy).  Surgery to remove part of the skull (decompressive craniectomy). This allows room for the brain to swell.  Follow these instructions at home: Activity  Rest as much as possible and avoid activities that are physically  hard or tiring.  Make sure you get enough sleep.  Limit activities that require a lot of thought or attention, such as: ? Watching TV. ? Playing memory games and puzzles. ? Job-related work or homework. ? Working on [employer], and texting.  Avoid activities that could cause another head injury, such as playing sports, until your health care provider approves. Having another head injury, especially  before the first one has healed, can be dangerous.  Ask your health care provider when it is safe for you to return to your regular activities, including work or school. Ask your health care provider for a step-by-step plan for gradually returning to activities.  Ask your health care provider when you can drive, ride a bicycle, or use heavy machinery. Your ability to react may be slower after a brain injury. Never do these activities if you are dizzy.  Lifestyle  Do not drink alcohol until your health care provider approves, and avoid drug use. Alcohol and certain drugs may slow your recovery and can put you at risk of further injury.  If it is harder than usual to remember things, write them down.  If you are easily distracted, try to do one thing at a time.  Talk with family members or close friends when making important decisions.  Tell your friends, family, a trusted colleague, and work Freight forwarder about your injury, symptoms, and restrictions. Have them watch for any new or worsening problems.  General instructions  Take over-the-counter and prescription medicines only as told by your health care provider.  Have someone stay with you for 24 hours after your head injury. This person should watch you for any changes in your symptoms and be ready to seek medical help, as needed.  Keep all follow-up visits as told by your health care provider. This is important.  Prevention  Work on improving your balance and strength to avoid falls.  Wear a seatbelt when you are in a moving vehicle.  Wear a helmet when riding a bicycle, skiing, or doing any other sport or activity that has a risk of injury.  Drink alcohol only in moderation.  Take safety measures in your home, such as: ? Removing clutter and tripping hazards from floors and stairways. ? Using grab bars in bathrooms and handrails by stairs. ? Placing non-slip mats on floors and in bathtubs. ? Improving lighting in dim areas. Get  help right away if:  You have: ? A severe headache that is not helped by medicine. ? Trouble walking, have weakness in your arms and legs, or lose your balance. ? Clear or bloody fluid coming from your nose or ears. ? Changes in your vision. ? A seizure.  You vomit.  Your symptoms get worse.  Your speech is slurred.  You pass out.  You are sleepier and have trouble staying awake.  Your pupils change size. These symptoms may represent a serious problem that is an emergency. Do not wait to see if the symptoms will go away. Get medical help right away. Call your local emergency services (911 in the [place] Do not drive yourself to the hospital. This information is not intended to replace advice given to you by your health care provider. Make sure you discuss any questions you have with your health care provider. Document Released: 07/01/2005 Document Revised: 01/26/2016 Document Reviewed: 01/09/2016 Elsevier Interactive Patient Education  2017 [employer].      IF you received an x-Sanders today, you will receive an invoice  from [facility]. Please contact [facility] at [phone] with questions or concerns regarding your invoice.   IF you received labwork today, you will receive an invoice from [facility]. Please contact [facility] at [phone] with questions or concerns regarding your invoice.   Our billing staff will not be able to assist you with questions regarding bills from these companies.  You will be contacted with the lab results as soon as they are available. The fastest way to get your results is to activate your My Chart account. Instructions are located on the last page of this paperwork. If you have not heard from [place] regarding the results in 2 weeks, please contact this office.       I personally performed the services described in this documentation, which was scribed in my presence. The recorded information has been reviewed and considered for  accuracy and completeness, addended by me as needed, and agree with information above.  Signed,   Cheyenne Ray, MD [facility].  12/17/17 9:52 PM

## 2017-12-15 NOTE — Patient Instructions (Addendum)
Based on your current exam I would not recommend a CT scan of the head or other imaging for the bones at this time.  Tylenol over-the-counter as needed.  See information on contusions and head injuries below but if any worsening of symptoms as we discussed I would recommend recheck here or the emergency room if needed.   Contusion A contusion is a deep bruise. Contusions are the result of a blunt injury to tissues and muscle fibers under the skin. The injury causes bleeding under the skin. The skin overlying the contusion may turn blue, purple, or yellow. Minor injuries will give you a painless contusion, but more severe contusions may stay painful and swollen for a few weeks. What are the causes? This condition is usually caused by a blow, trauma, or direct force to an area of the body. What are the signs or symptoms? Symptoms of this condition include:  Swelling of the injured area.  Pain and tenderness in the injured area.  Discoloration. The area may have redness and then turn blue, purple, or yellow.  How is this diagnosed? This condition is diagnosed based on a physical exam and medical history. An X-ray, CT scan, or MRI may be needed to determine if there are any associated injuries, such as broken bones (fractures). How is this treated? Specific treatment for this condition depends on what area of the body was injured. In general, the best treatment for a contusion is resting, icing, applying pressure to (compression), and elevating the injured area. This is often called the RICE strategy. Over-the-counter anti-inflammatory medicines may also be recommended for pain control. Follow these instructions at home:  Rest the injured area.  If directed, apply ice to the injured area: ? Put ice in a plastic bag. ? Place a towel between your skin and the bag. ? Leave the ice on for 20 minutes, 2-3 times per day.  If directed, apply light compression to the injured area using an elastic  bandage. Make sure the bandage is not wrapped too tightly. Remove and reapply the bandage as directed by your health care provider.  If possible, raise (elevate) the injured area above the level of your heart while you are sitting or lying down.  Take over-the-counter and prescription medicines only as told by your health care provider. Contact a health care provider if:  Your symptoms do not improve after several days of treatment.  Your symptoms get worse.  You have difficulty moving the injured area. Get help right away if:  You have severe pain.  You have numbness in a hand or foot.  Your hand or foot turns pale or cold. This information is not intended to replace advice given to you by your health care provider. Make sure you discuss any questions you have with your health care provider. Document Released: 04/10/2005 Document Revised: 11/09/2015 Document Reviewed: 11/16/2014 Elsevier Interactive Patient Education  2018 [place].    Facial or Scalp Contusion A facial or scalp contusion is a deep bruise (contusion) on the face or head. Injuries to the face and head generally cause a lot of swelling, especially around the eyes. Contusions are the result of an injury that caused bleeding under the skin. The contusion may turn blue, purple, or yellow. Minor injuries will give you a painless contusion, but more severe contusions may stay painful and swollen for a few weeks. What are the causes? A facial or scalp contusion is caused by a blunt injury, fall, or trauma to the  face or head area. What are the signs or symptoms? Symptoms of this condition include:  Swelling of the injured area.  Discoloration of the injured area.  Tenderness, soreness, or pain in the injured area.  How is this diagnosed? This condition is diagnosed based on your medical history and a physical exam. An X-ray exam, CT scan, or MRI may be needed to check for any additional injuries, such as broken  bones (fractures). How is this treated? Often, the best treatment for a facial or scalp contusion is applying cold compresses to the injured area. Over-the-counter medicines may also be recommended for pain control. Follow these instructions at home:  Take over-the-counter and prescription medicines only as told by your health care provider.  If directed, apply ice to the injured area. ? Put ice in a plastic bag. ? Place a towel between your skin and the bag. ? Leave the ice on for 20 minutes, 2-3 times a day.  Keep all follow-up visits as told by your health care provider. This is important. Contact a health care provider if:  You have trouble biting or chewing.  Your pain or swelling gets worse.  You have pain when you move your eyes. Get help right away if:  You have severe pain or a headache that is not relieved by medicine.  You have unusual sleepiness, confusion, or personality changes.  You vomit.  You have a nosebleed that does not stop.  You have double vision or blurred vision.  You have a continuous clear fluid draining from your nose or ear.  You have trouble walking or using your arms or legs.  You have severe dizziness. Summary  A facial or scalp contusion is a deep bruise (contusion) on the face or head.  Contusions are the result of an injury that caused bleeding under the skin.  Minor injuries will give you a painless contusion, but more severe contusions may stay painful and swollen for a few weeks.  Often, the best treatment for a facial or scalp contusion is applying cold compresses to the injured area. This information is not intended to replace advice given to you by your health care provider. Make sure you discuss any questions you have with your health care provider. Document Released: 08/08/2004 Document Revised: 05/21/2016 Document Reviewed: 05/21/2016 Elsevier Interactive Patient Education  2017 [place] Injury, Adult There  are many types of head injuries. Head injuries can be as minor as a bump, or they can be more severe. More severe head injuries include:  A jarring injury to the brain (concussion).  A bruise of the brain (contusion). This means there is bleeding in the brain that can cause swelling.  A cracked skull (skull fracture).  Bleeding in the brain that collects, clots, and forms a bump (hematoma).  After a head injury, you may need to be observed for a while in the emergency department or urgent care. Sometimes admission to the hospital is needed. After a head injury has happened, most problems occur within the first 24 hours, but side effects may occur up to 7-10 days after the injury. It is important to watch your condition for any changes. What are the causes? There are many possible causes of a head injury. A serious head injury may happen to someone who is in a car accident (motor vehicle collision). Other causes of major head injuries include bicycle or motorcycle accidents, sports injuries, and falls. Risk factors This condition is more likely to occur  in people who:  Drink a lot of alcohol or use drugs.  Are over the age of 2.  Are at risk for falls.  What are the symptoms? There are many possible symptoms of a head injury. Visible symptoms of a head injury include a bruise, bump, or bleeding at the site of the injury. Other non-visible symptoms include:  Feeling sleepy or not being able to stay awake.  Passing out.  Headache.  Seizures.  Dizziness.  Confusion.  Memory problems.  Nausea or vomiting.  Other possible symptoms that may develop after the head injury include:  Poor attention and concentration.  Fatigue or tiring easily.  Irritability.  Being uncomfortable around bright lights or loud noises.  Anxiety or depression.  Disturbed sleep.  How is this diagnosed? This condition can usually be diagnosed based on your symptoms, a description of the  injury, and a physical exam. You may also have imaging tests done, such as a CT scan or MRI. You will also be closely watched. How is this treated? Treatment for this condition depends on the severity and type of injury you have. The main goal of treatment is to prevent complications and allow the brain time to heal. For mild head injury, you may be sent home and treatment may include:  Observation. A responsible adult should stay with you for 24 hours after your injury and check on you often.  Physical rest.  Brain rest.  Pain medicines.  For severe brain injury, treatment may include:  Close observation. This includes hospitalization with frequent physical exams. You may need to go to a hospital that specializes in head injury.  Pain medicines.  Breathing support. This may include using a ventilator.  Managing the pressure inside the brain (intracranial pressure, or ICP). This may include: ? Monitoring the ICP. ? Giving medicines to decrease the ICP. ? Positioning you to decrease the ICP.  Medicine to prevent seizures.  Surgery to stop bleeding or to remove blood clots (craniotomy).  Surgery to remove part of the skull (decompressive craniectomy). This allows room for the brain to swell.  Follow these instructions at home: Activity  Rest as much as possible and avoid activities that are physically hard or tiring.  Make sure you get enough sleep.  Limit activities that require a lot of thought or attention, such as: ? Watching TV. ? Playing memory games and puzzles. ? Job-related work or homework. ? Working on [employer], and texting.  Avoid activities that could cause another head injury, such as playing sports, until your health care provider approves. Having another head injury, especially before the first one has healed, can be dangerous.  Ask your health care provider when it is safe for you to return to your regular activities, including work or  school. Ask your health care provider for a step-by-step plan for gradually returning to activities.  Ask your health care provider when you can drive, ride a bicycle, or use heavy machinery. Your ability to react may be slower after a brain injury. Never do these activities if you are dizzy.  Lifestyle  Do not drink alcohol until your health care provider approves, and avoid drug use. Alcohol and certain drugs may slow your recovery and can put you at risk of further injury.  If it is harder than usual to remember things, write them down.  If you are easily distracted, try to do one thing at a time.  Talk with family members or close friends when  making important decisions.  Tell your friends, family, a trusted colleague, and work Fre[employer] about your injury, symptoms, and restrictions. Have them watch for any new or worsening problems.  General instructions  Take over-the-counter and prescription medicines only as told by your health care provider.  Have someone stay with you for 24 hours after your head injury. This person should watch you for any changes in your symptoms and be ready to seek medical help, as needed.  Keep all follow-up visits as told by your health care provider. This is important.  Prevention  Work on improving your balance and strength to avoid falls.  Wear a seatbelt when you are in a moving vehicle.  Wear a helmet when riding a bicycle, skiing, or doing any other sport or activity that has a risk of injury.  Drink alcohol only in moderation.  Take safety measures in your home, such as: ? Removing clutter and tripping hazards from floors and stairways. ? Using grab bars in bathrooms and handrails by stairs. ? Placing non-slip mats on floors and in bathtubs. ? Improving lighting in dim areas. Get help right away if:  You have: ? A severe headache that is not helped by medicine. ? Trouble walking, have weakness in your arms and legs, or lose your  balance. ? Clear or bloody fluid coming from your nose or ears. ? Changes in your vision. ? A seizure.  You vomit.  Your symptoms get worse.  Your speech is slurred.  You pass out.  You are sleepier and have trouble staying awake.  Your pupils change size. These symptoms may represent a serious problem that is an emergency. Do not wait to see if the symptoms will go away. Get medical help right away. Call your local emergency services (911 in the [place] Do not drive yourself to the hospital. This information is not intended to replace advice given to you by your health care provider. Make sure you discuss any questions you have with your health care provider. Document Released: 07/01/2005 Document Revised: 01/26/2016 Document Reviewed: 01/09/2016 Elsevier Interactive Patient Education  2017 [employer].      IF you received an x-ray today, you will receive an invoice from [facility]. Please contact [facility] at [phone] with questions or concerns regarding your invoice.   IF you received labwork today, you will receive an invoice from [facility]. Please contact [facility] at [phone] with questions or concerns regarding your invoice.   Our billing staff will not be able to assist you with questions regarding bills from these companies.  You will be contacted with the lab results as soon as they are available. The fastest way to get your results is to activate your My Chart account. Instructions are located on the last page of this paperwork. If you have not heard from [place] regarding the results in 2 weeks, please contact this office.

## 2017-12-17 ENCOUNTER — Encounter: Payer: Self-pay | Admitting: Family Medicine

## 2018-01-07 ENCOUNTER — Telehealth: Payer: Self-pay | Admitting: Podiatry

## 2018-01-07 MED ORDER — NONFORMULARY OR COMPOUNDED ITEM: 11 refills | Status: DC

## 2018-01-07 NOTE — Addendum Note (Signed)
Addended by: Harriett Sine D on: 01/07/2018 09:17 AM   Modules accepted: Orders

## 2018-01-07 NOTE — Telephone Encounter (Signed)
I talked to one of the girls in appointments yesterday and I'm not sure if she transferred that I wanted to speak to the nurse or not. I've been doing the laser and we stopped that. I was supposed to come back I think in July. I have lost my medicine that is like the nail polish stuff that they gave me. I don't know if I need to get some more of that or keep doing that or not? If I can have a nurse to call me back at [phone]. Thank you very much. Bye bye.

## 2018-01-07 NOTE — Telephone Encounter (Addendum)
I informed pt that often pt's are on the antifungal lacquer for 6-9 months or until good healthy outgrowth is seen, and I could order from [place] again. Faxed orders to [employer].

## 2018-01-27 ENCOUNTER — Ambulatory Visit: Payer: BLUE CROSS/BLUE SHIELD | Admitting: Podiatry

## 2018-01-29 ENCOUNTER — Encounter: Payer: Self-pay | Admitting: Podiatry

## 2018-01-29 ENCOUNTER — Ambulatory Visit (INDEPENDENT_AMBULATORY_CARE_PROVIDER_SITE_OTHER): Payer: Medicare Other | Admitting: Podiatry

## 2018-01-29 DIAGNOSIS — L603 Nail dystrophy: Secondary | ICD-10-CM | POA: Diagnosis not present

## 2018-01-31 NOTE — Progress Notes (Signed)
She presents today for follow-up of her laser therapy.  She states that I think that it is improving and I continue to use this topical terbinafine.  Objective: Vital signs are stable she is alert and oriented x3 nail is approximately 50% grown out appears to be healing very nicely.  Assessment: Resolving onychomycosis hallux right.  Plan: Encouraged her to continue topical therapies follow-up with [place] with any recurrence or regression.

## 2018-02-03 DIAGNOSIS — T180XXA Foreign body in mouth, initial encounter: Secondary | ICD-10-CM | POA: Diagnosis not present

## 2018-02-10 ENCOUNTER — Telehealth: Payer: Self-pay | Admitting: Family Medicine

## 2018-02-10 NOTE — Telephone Encounter (Signed)
Copied from [place] [phone]. Topic: General - Other >> Feb 10, 2018  3:35 PM Keene Breath wrote: Reason for CRM: Patient called to speak with an office manager or nurse regarding a mixup in her medical records.  She once lived in [place], [place] and the records show conditions that patient states she does not have.  Please advise.  Patient would like a call back asap.  CB# [phone].

## 2018-02-11 NOTE — Telephone Encounter (Signed)
Hello Chanda,  Can you please investigate and direct to correct people to fix this issue. Thanks I Pamella Pert, MD

## 2018-02-11 NOTE — Telephone Encounter (Signed)
Spoke with pt regarding he medical chart, she believes that her chart has been fused with someone else's information. She has never had heart procedures done in the past. Concerned if this is not fixed may cause incorrect diagnosis in the future.

## 2018-02-24 DIAGNOSIS — H04123 Dry eye syndrome of bilateral lacrimal glands: Secondary | ICD-10-CM | POA: Diagnosis not present

## 2018-05-08 DIAGNOSIS — Z23 Encounter for immunization: Secondary | ICD-10-CM | POA: Diagnosis not present

## 2018-05-25 ENCOUNTER — Telehealth: Payer: Self-pay | Admitting: Cardiovascular Disease

## 2018-05-25 ENCOUNTER — Ambulatory Visit: Payer: Self-pay

## 2018-05-25 NOTE — Progress Notes (Signed)
Cardiology Office Note   Date:  05/26/2018   ID:  Cheyenne Sanders, DOB Nov 19, 1950, MRN [id number]  PCP:  Rutherford Guys, MD  Cardiologist:  Dr. Claiborne Billings  Chief Complaint  Patient presents with  . Chest Pain  . Mitral Valve Prolapse     History of Present Illness: Cheyenne Sanders is a 68 y.o. female who presents for ongoing assessment and management of palpitations and atypical chest pain. She was recommended for echocardiogram 2018, which revealed high ventricular filling pressures, mild focal calcification of the anterior mitral valve leaflet and mild MR with mild PR. She was recommended for metoprolol 25 mg daily. She was found to have hyperlipidemia, and advised on low cholesterol diet. Repeat labs were to be done with institution of statin therapy if she continued to have elevated levels.    She called our office on 11/11/ 2019 complaining of burning in her chest. Antiacids helped but she requested office visit for further evaluation. She is a former patient of Dr. Charlean Merl, [facility]. She has had a prior stress echo in 12/23/2012, which was normal. She has had an echo in 07/2016 as above.   She has been having recurrent chest burning, sometimes with exertion but not always. She states that the pain has kept her up at night, sometimes radiating to her back. She denies dizziness, mild shortness of breath occurs with the pain.   Past Medical History:  Diagnosis Date  . Breast cancer ([place]) 2703,5009   R breast cancer x 2; lumpectomy with radiation, chemotherapy; mastectomy R.  . Nephrolithiasis   . Personal history of chemotherapy   . Personal history of radiation therapy     Past Surgical History:  Procedure Laterality Date  . ABDOMINAL HYSTERECTOMY  1986   DUB; ovaries intact.  Mar[facility] Kitchen BREAST BIOPSY Left 2016  . BREAST SURGERY Right 2014  . cardiac stenting  07/15/1989  . MASTECTOMY Right    2014     Current Outpatient Medications  Medication Sig Dispense  Refill  . aspirin 325 MG tablet Take 325 mg by mouth as needed.    . NONFORMULARY OR COMPOUNDED ITEM [facility]:  Onychomycosis Nail Lacquer - Fluconazole 2%, Terbinafine 1%, DMSO, apply to affected area daily. 120 each 11  . omeprazole (PRILOSEC) 20 MG capsule Take 1 capsule (20 mg total) by mouth daily. 30 capsule 11   No current facility-administered medications for this visit.     Allergies:   Fentanyl    Social History:  The patient  reports that she has never smoked. She has never used smokeless tobacco. She reports that she drinks about 1.0 standard drinks of alcohol per week. She reports that she does not use drugs.   Family History:  [name] family history includes Cancer in her father; Heart disease in her mother.    ROS: All other systems are reviewed and negative. Unless otherwise mentioned in H&P    PHYSICAL EXAM: VS:  BP 120/78   Pulse 64   Ht 5\' 5"  (1.651 m)   Wt 126 lb 12.8 oz (57.5 kg)   BMI 21.10 kg/m  , BMI Body mass index is 21.1 kg/m. GEN: Well nourished, well developed, in no acute distress HEENT: normal Neck: no JVD, carotid bruits, or masses Cardiac: RRR; no murmurs, rubs, or gallops,no edema  Respiratory:  Clear to auscultation bilaterally, normal work of breathing GI: soft, nontender, nondistended, + BS MS: no deformity or atrophy Skin: warm and dry, no rash Neuro:  Strength and sensation are intact Psych: euthymic mood, full affect   EKG:  NSR rate of 64 bpm.   Recent Labs: 06/17/2017: ALT 25; BUN 23; Creatinine, Ser 0.60; Hemoglobin 13.6; Platelets 247; Potassium 4.2; Sodium 145; TSH 1.300    Lipid Panel    Component Value Date/Time   CHOL 217 (H) 06/17/2017 1342   TRIG 77 06/17/2017 1342   HDL 54 06/17/2017 1342   CHOLHDL 4.0 06/17/2017 1342   LDLCALC 148 (H) 06/17/2017 1342      Wt Readings from Last 3 Encounters:  05/26/18 126 lb 12.8 oz (57.5 kg)  12/15/17 124 lb 6.4 oz (56.4 kg)  11/25/17 122 lb 3.2 oz (55.4 kg)      Other studies Reviewed: Echocardiogram1 /02/2018 Left ventricle: The cavity size was normal. Systolic function was   normal. The estimated ejection fraction was in the range of 60%   to 65%. Wall motion was normal; there were no regional wall   motion abnormalities. The study is not technically sufficient to   allow evaluation of LV diastolic function. Doppler parameters are   consistent with high ventricular filling pressure. - Aortic valve: There was mild regurgitation. - Mitral valve: Mild focal calcification of the anterior leaflet   (medial segment(s)). There was mild regurgitation. - Pulmonic valve: There was mild regurgitation.   ASSESSMENT AND PLAN:  1. Recurrent chest pain: Described as burning sometimes severe. She has taken some antiacids which helped somewhat but this has become more concerning for her. I have recommended a NM stress test or CTA. She has multiple questions about both tests and wants to avoid radiation as much as possible. She is willing to proceed with one of them but wants to talk to her son, who is a nurse first. She will call us with her decision before testing is planned.   2. GERD: It is possible that she is having some GERD symptoms as well. I have given her omeprazole 20 mg daily. She still has her gallbladder as well. Consider GI work up if symptoms persist, if cardiac testing is negative for ischemia.  3. Hypercholesterolemia:  She is not currently on a statin, nor has she had lipids completed in over a year. Will order lipids and LFT's, BMET, and CBC as she has not had any labs for over a year for screening purposes.    Current medicines are reviewed at length with the patient today.    Labs/ tests ordered today include: Lipids and LFT's. CBC, BMET. (NM stress test or CTA-patient to decide).    Phill Myron. West Pugh, ANP, [facility]   05/26/2018 10:55 AM    [address] Office [phone] Fax  [phone]

## 2018-05-25 NOTE — Telephone Encounter (Signed)
Pt c/o "burning" chest pain to the middle of her chest. The pain woke the pt up at 1:00 am. Pt got out of bed took and aspirin and took antacids and went to sleep. Pt took 2 more antacids this morning. Pt stated the pain comes and goes, but burning sensation in her chest is  still present this am. Pt describes the pain as mild. Pt stated she had some nausea that has subsided. Pt advised to go to the ED for evaluation. Pt stated she is reluctant to go the ED. Pt stated she will call her cardiologist for advice. Called [facility] and was told that there are no openings and to get the pt established with new PCP.  Pt advised that her symptoms can mimic a cardiac event and the best place to get evaluated is the ED. Unsure if the pt will go to the ED. Care advice given and pt verbalized understanding.   Reason for Disposition . Chest pain lasts > 5 minutes (Exceptions: chest pain occurring > 3 days ago and now asymptomatic; same as previously diagnosed heartburn and has accompanying sour taste in mouth)  Answer Assessment - Initial Assessment Questions 1. LOCATION: "Where does it hurt?"       Center of chest - burning sensation in the middle of the chest 2. RADIATION: "Does the pain go anywhere else?" (e.g., into neck, jaw, arms, back)     no 3. ONSET: "When did the chest pain begin?" (Minutes, hours or days)      0100 4. PATTERN "Does the pain come and go, or has it been constant since it started?"  "Does it get worse with exertion?"      Comes and goes-no 5. DURATION: "How long does it last" (e.g., seconds, minutes, hours)     Still having the burning sesation 6. SEVERITY: "How bad is the pain?"  (e.g., Scale 1-10; mild, moderate, or severe)    - MILD (1-3): doesn't interfere with normal activities     - MODERATE (4-7): interferes with normal activities or awakens from sleep    - SEVERE (8-10): excruciating pain, unable to do any normal activities       mild 7. CARDIAC RISK FACTORS: "Do you have any  history of heart problems or risk factors for heart disease?" (e.g., prior heart attack, angina; high blood pressure, diabetes, being overweight, high cholesterol, smoking, or strong family history of heart disease)   mother with atrial fibrillation 8. PULMONARY RISK FACTORS: "Do you have any history of lung disease?"  (e.g., blood clots in lung, asthma, emphysema, birth control pills)     no 9. CAUSE: "What do you think is causing the chest pain?"     indigestion 10. OTHER SYMPTOMS: "Do you have any other symptoms?" (e.g., dizziness, nausea, vomiting, sweating, fever, difficulty breathing, cough)       Nausea that subsided 11. PREGNANCY: "Is there any chance you are pregnant?" "When was your last menstrual period?"       N/a  Protocols used: CHEST PAIN-A-AH

## 2018-05-25 NOTE — Telephone Encounter (Signed)
Returned call to patient, patient states she woke up last night having chest pain.  She describes this as a burning sensation.  She took a 325 mg ASA and laid back down.   She woke up again later with the same sensation, so she took antacids.  She sat in her recliner and went to sleep.  She states this AM she feels okay, denies pain but states she still has a slight burning sensation.   She states she did have slight nausea over the night but did not vomit.  Denies nausea now, denies radiation, diaphoresis, SOB.    Advised if currently still experiencing pain/burning sensation, would recommend ER to be evaluated for acute issues.   She states she does not want to go to ER.   Moved appt to tomorrow with APP at 9:30 but again advised if having acute symptoms would recommend ER evaluation.   Patient aware and verbalized understanding.

## 2018-05-25 NOTE — Telephone Encounter (Signed)
New Message   Pt c/o of Chest Pain: STAT if CP now or developed within 24 hours  1. Are you having CP right now? no  2. Are you experiencing any other symptoms (ex. SOB, nausea, vomiting, sweating)? Burning sensation   3. How long have you been experiencing CP? Just occurred over in the night   4. Is your CP continuous or coming and going? Coming and going   5. Have you taken Nitroglycerin? No  Patient states that the pain occurred over in the night. She took an aspirin and some antacid medication. This morning she has no pain. She wanted an appt. I did schedule her to come in on 11/14 to see Dr. Claiborne Billings.  ?

## 2018-05-26 ENCOUNTER — Encounter: Payer: Self-pay | Admitting: Adult Health

## 2018-05-26 ENCOUNTER — Ambulatory Visit (INDEPENDENT_AMBULATORY_CARE_PROVIDER_SITE_OTHER): Payer: Medicare Other | Admitting: Adult Health

## 2018-05-26 VITALS — BP 120/78 | HR 64 | Ht 65.0 in | Wt 126.8 lb

## 2018-05-26 DIAGNOSIS — R072 Precordial pain: Secondary | ICD-10-CM | POA: Diagnosis not present

## 2018-05-26 DIAGNOSIS — Z79899 Other long term (current) drug therapy: Secondary | ICD-10-CM | POA: Diagnosis not present

## 2018-05-26 MED ORDER — OMEPRAZOLE 20 MG PO CPDR: 20.0000 mg | DELAYED_RELEASE_CAPSULE | Freq: Every day | ORAL | 11 refills | Status: DC

## 2018-05-26 NOTE — Patient Instructions (Signed)
Medication Instructions:  START OMEPRAZOLE 20MG  DAILY  If you need a refill on your cardiac medications before your next appointment, please call your pharmacy.  Labwork: LFT,LIPID,BMET,CBC AND MAG TODAY HERE IN OUR OFFICE AT [facility]  Take the provided lab slips with you to the lab for your blood draw.   If you have labs (blood work) drawn today and your tests are completely normal, you will receive your results only by: Marland Kitchen MyChart Message (if you have MyChart) OR . A paper copy in the mail If you have any lab test that is abnormal or we need to change your treatment, we will call you to review the results.  Testing/Procedures: PLEASE CALL BACK AFTER YOU SPEAK WITH SON ABOUT STRESS TEST/CT-A  Follow-Up: You will need a follow up appointment in 1 MONTH.  You may see  DR Tiana Loft, DNP, ANP or one of the following Advanced Practice Providers on your designated Care Team:    . Almyra Deforest, PA-C . Fabian Sharp, PA-C  At [facility], you and your health needs are our priority.  As part of our continuing mission to provide you with exceptional heart care, we have created designated Provider Care Teams.  These Care Teams include your primary Cardiologist (physician) and Advanced Practice Providers (APPs -  Physician Assistants and Nurse Practitioners) who all work together to provide you with the care you need, when you need it.  Thank you for choosing [facility] at [facility]!!

## 2018-05-27 DIAGNOSIS — Z79899 Other long term (current) drug therapy: Secondary | ICD-10-CM | POA: Diagnosis not present

## 2018-05-27 DIAGNOSIS — R072 Precordial pain: Secondary | ICD-10-CM | POA: Diagnosis not present

## 2018-05-27 LAB — BASIC METABOLIC PANEL
BUN/Creatinine Ratio: 27 (ref 12–28)
BUN: 16 mg/dL (ref 8–27)
CO2: 22 mmol/L (ref 20–29)
Calcium: 9.4 mg/dL (ref 8.7–10.3)
Chloride: 99 mmol/L (ref 96–106)
Creatinine, Ser: 0.6 mg/dL (ref 0.57–1.00)
GFR calc Af Amer: 111 mL/min/{1.73_m2} (ref >59)
GFR calc non Af Amer: 96 mL/min/{1.73_m2} (ref >59)
Glucose: 74 mg/dL (ref 65–99)
Potassium: 4.5 mmol/L (ref 3.5–5.2)
Sodium: 139 mmol/L (ref 134–144)

## 2018-05-27 LAB — CBC
Hematocrit: 42.3 % (ref 34.0–46.6)
Hemoglobin: 14.3 g/dL (ref 11.1–15.9)
MCH: 31.2 pg (ref 26.6–33.0)
MCHC: 33.8 g/dL (ref 31.5–35.7)
MCV: 92 fL (ref 79–97)
Platelets: 239 10*3/uL (ref 150–450)
RBC: 4.59 x10E6/uL (ref 3.77–5.28)
RDW: 12.9 % (ref 12.3–15.4)
WBC: 6.8 10*3/uL (ref 3.4–10.8)

## 2018-05-27 LAB — HEPATIC FUNCTION PANEL
ALT: 50 IU/L — ABNORMAL HIGH (ref 0–32)
AST: 29 IU/L (ref 0–40)
Albumin: 4.5 g/dL (ref 3.6–4.8)
Alkaline Phosphatase: 77 IU/L (ref 39–117)
Bilirubin Total: 0.6 mg/dL (ref 0.0–1.2)
Bilirubin, Direct: 0.13 mg/dL (ref 0.00–0.40)
Total Protein: 6.7 g/dL (ref 6.0–8.5)

## 2018-05-27 LAB — LIPID PANEL
Chol/HDL Ratio: 4.3 ratio (ref 0.0–4.4)
Cholesterol, Total: 237 mg/dL — ABNORMAL HIGH (ref 100–199)
HDL: 55 mg/dL (ref >39)
LDL Calculated: 157 mg/dL — ABNORMAL HIGH (ref 0–99)
Triglycerides: 123 mg/dL (ref 0–149)
VLDL Cholesterol Cal: 25 mg/dL (ref 5–40)

## 2018-05-27 LAB — MAGNESIUM: Magnesium: 2.3 mg/dL (ref 1.6–2.3)

## 2018-05-28 ENCOUNTER — Other Ambulatory Visit: Payer: Self-pay

## 2018-05-28 ENCOUNTER — Ambulatory Visit: Payer: Self-pay | Admitting: Cardiovascular Disease

## 2018-05-28 MED ORDER — ATORVASTATIN CALCIUM 40 MG PO TABS: 40.0000 mg | ORAL_TABLET | Freq: Every day | ORAL | 0 refills | Status: DC

## 2018-05-28 MED ORDER — ATORVASTATIN CALCIUM 40 MG PO TABS: 40.0000 mg | ORAL_TABLET | Freq: Every day | ORAL | 3 refills | Status: DC

## 2018-05-28 NOTE — Progress Notes (Signed)
Notes recorded by Lendon Colonel, NP on 05/28/2018 at 7:23 AM EST Please begin atorvastatin 40 mg daily. Repeat lipids and LFT's in 6 weeks. The rest of the labs are okay.

## 2018-05-28 NOTE — Telephone Encounter (Signed)
FYI

## 2018-05-30 NOTE — Telephone Encounter (Signed)
Patient saw cards

## 2018-06-01 ENCOUNTER — Ambulatory Visit (INDEPENDENT_AMBULATORY_CARE_PROVIDER_SITE_OTHER): Payer: Medicare Other | Admitting: Family Medicine

## 2018-06-01 ENCOUNTER — Encounter: Payer: Self-pay | Admitting: Family Medicine

## 2018-06-01 VITALS — BP 134/82 | HR 75 | Temp 98.0°F | Resp 16 | Ht 65.0 in | Wt 128.0 lb

## 2018-06-01 DIAGNOSIS — S80869A Insect bite (nonvenomous), unspecified lower leg, initial encounter: Secondary | ICD-10-CM | POA: Diagnosis not present

## 2018-06-01 DIAGNOSIS — W57XXXA Bitten or stung by nonvenomous insect and other nonvenomous arthropods, initial encounter: Secondary | ICD-10-CM | POA: Diagnosis not present

## 2018-06-01 DIAGNOSIS — R21 Rash and other nonspecific skin eruption: Secondary | ICD-10-CM | POA: Diagnosis not present

## 2018-06-01 MED ORDER — TRIAMCINOLONE ACETONIDE 0.1 % EX CREA: 1.0000 "application " | TOPICAL_CREAM | Freq: Two times a day (BID) | CUTANEOUS | 0 refills | Status: DC

## 2018-06-01 MED ORDER — LORATADINE 10 MG PO TABS: 10.0000 mg | ORAL_TABLET | Freq: Every day | ORAL | 11 refills | Status: DC

## 2018-06-01 NOTE — Progress Notes (Signed)
11/18/20195:42 PM  Cheyenne Sanders 01/02/1951, 67 y.o. female [id number]  Chief Complaint  Patient presents with  . Rash    ankle, red bumps and blisters itchy. x 1 wk    HPI:   Patient is a 67 y.o. female who presents today for rash  5 night she woke up in middle of the night with severe itching of ankles Noted blisters Earlier that day was wearing boots Most bites are on left ankle Has one on right ankle Has been keep clean and using triple abx No new bites since then  Not itchy anymore  Fall Risk  06/01/2018 12/15/2017 11/25/2017 06/16/2017 05/22/2017  Falls in the past year? 0 Yes No No No  Number falls in past yr: - 1 - - -     Depression screen [facility] 2/9 12/15/2017 11/25/2017 06/16/2017  Decreased Interest 0 0 0  Down, Depressed, Hopeless 0 0 0  PHQ - 2 Score 0 0 0    Allergies  Allergen Reactions  . Fentanyl Shortness Of Breath    Prior to Admission medications   Medication Sig Start Date End Date Taking? Authorizing Provider  aspirin 325 MG tablet Take 325 mg by mouth as needed.   Yes [provider]  atorvastatin (LIPITOR) 40 MG tablet Take 1 tablet (40 mg total) by mouth daily. 05/28/18 08/26/18 Yes Lendon Colonel, NP  NONFORMULARY OR COMPOUNDED [place]:  Onychomycosis Nail Lacquer - Fluconazole 2%, Terbinafine 1%, DMSO, apply to affected area daily. 01/07/18  Yes Hyatt, Max T, DPM  omeprazole (PRILOSEC) 20 MG capsule Take 1 capsule (20 mg total) by mouth daily. 05/26/18  Yes Lendon Colonel, NP    Past Medical History:  Diagnosis Date  . Breast cancer ([place]) 5329,9242   R breast cancer x 2; lumpectomy with radiation, chemotherapy; mastectomy R.  . Nephrolithiasis   . Personal history of chemotherapy   . Personal history of radiation therapy     Past Surgical History:  Procedure Laterality Date  . ABDOMINAL HYSTERECTOMY  1986   DUB; ovaries intact.  Marland Kitchen BREAST BIOPSY Left 2016  . BREAST SURGERY Right 2014  . cardiac  stenting  07/15/1989  . MASTECTOMY Right    2014    Social History   Tobacco Use  . Smoking status: Never Smoker  . Smokeless tobacco: Never Used  Substance Use Topics  . Alcohol use: Yes    Alcohol/week: 1.0 standard drinks    Types: 1 Glasses of wine per week    Family History  Problem Relation Age of Onset  . Heart disease Mother   . Cancer Father   . Breast cancer Neg Hx     ROS Per hpi  OBJECTIVE:  Blood pressure 134/82, pulse 75, temperature 98 F (36.7 C), temperature source Oral, resp. rate 16, height 5\' 5"  (1.651 m), weight 128 lb (58.1 kg), SpO2 100 %. Body mass index is 21.3 kg/m.   Physical Exam  Constitutional: She is oriented to person, place, and time. She appears well-developed and well-nourished.  HENT:  Head: Normocephalic and atraumatic.  Mouth/Throat: Mucous membranes are normal.  Eyes: Pupils are equal, round, and reactive to light. Conjunctivae and EOM are normal. No scleral icterus.  Neck: Neck supple.  Pulmonary/Chest: Effort normal.  Neurological: She is alert and oriented to person, place, and time.  Skin: Skin is warm and dry.  Scattered purpulish nonblachable macules with central blister all over posterior left ankle, calf and one on right ankle  Psychiatric: She has a normal mood and affect.  Nursing note and vitals reviewed.   ASSESSMENT and PLAN  1. Rash and nonspecific skin eruption 2. Insect bite of lower leg, unspecified laterality, initial encounter Discussed supportive measures, new meds r/se/b and RTC precautions.  Other orders - triamcinolone cream (KENALOG) 0.1 %; Apply 1 application topically 2 (two) times daily. - loratadine (CLARITIN) 10 MG tablet; Take 1 tablet (10 mg total) by mouth daily.  Return if symptoms worsen or fail to improve.    Rutherford Guys, MD [facility] at [facility] [place], [place] [address] Ph.  [phone] Fax [phone]

## 2018-06-11 ENCOUNTER — Encounter: Payer: Self-pay | Admitting: Family Medicine

## 2018-06-11 DIAGNOSIS — J209 Acute bronchitis, unspecified: Secondary | ICD-10-CM | POA: Diagnosis not present

## 2018-06-17 ENCOUNTER — Telehealth: Payer: Self-pay

## 2018-06-17 ENCOUNTER — Other Ambulatory Visit: Payer: Self-pay

## 2018-06-17 ENCOUNTER — Encounter: Payer: Self-pay | Admitting: Family Medicine

## 2018-06-17 ENCOUNTER — Ambulatory Visit (INDEPENDENT_AMBULATORY_CARE_PROVIDER_SITE_OTHER): Payer: Medicare Other | Admitting: Family Medicine

## 2018-06-17 VITALS — BP 133/68 | HR 70 | Temp 98.0°F | Resp 18 | Ht 64.37 in | Wt 129.2 lb

## 2018-06-17 DIAGNOSIS — Z5321 Procedure and treatment not carried out due to patient leaving prior to being seen by health care provider: Secondary | ICD-10-CM

## 2018-06-17 NOTE — Progress Notes (Signed)
Left w/o being seen

## 2018-06-17 NOTE — Telephone Encounter (Signed)
Pt gave medical records for ov at [facility]. Medical records and a disc was given to the Dr. Copies also for scan.

## 2018-06-17 NOTE — Patient Instructions (Signed)
° ° ° °  If you have lab work done today you will be contacted with your lab results within the next 2 weeks.  If you have not heard from us then please contact us. The fastest way to get your results is to register for My Chart. ° ° °IF you received an x-ray today, you will receive an invoice from [facility]. Please contact [facility] at [phone] with questions or concerns regarding your invoice.  ° °IF you received labwork today, you will receive an invoice from [facility]. Please contact [facility] at [phone] with questions or concerns regarding your invoice.  ° °Our billing staff will not be able to assist you with questions regarding bills from these companies. ° °You will be contacted with the lab results as soon as they are available. The fastest way to get your results is to activate your My Chart account. Instructions are located on the last page of this paperwork. If you have not heard from us regarding the results in 2 weeks, please contact this office. °  ° ° ° °

## 2018-06-18 ENCOUNTER — Telehealth: Payer: Self-pay | Admitting: Adult Health

## 2018-06-18 NOTE — Telephone Encounter (Signed)
Update  forwarded to provider Jory Sims, DNP

## 2018-06-18 NOTE — Telephone Encounter (Signed)
New Message   Patient is calling to advised that she has decided to wait on the stress test. She thinks the issue is more acid reflux than anything. So she wants to explore that avenue first.  She just wanted to let the provider know.

## 2018-06-19 NOTE — Telephone Encounter (Signed)
Ok. Thank you.

## 2018-08-11 DIAGNOSIS — H353131 Nonexudative age-related macular degeneration, bilateral, early dry stage: Secondary | ICD-10-CM | POA: Diagnosis not present

## 2018-08-11 DIAGNOSIS — H524 Presbyopia: Secondary | ICD-10-CM | POA: Diagnosis not present

## 2018-08-11 DIAGNOSIS — H04123 Dry eye syndrome of bilateral lacrimal glands: Secondary | ICD-10-CM | POA: Diagnosis not present

## 2018-08-11 DIAGNOSIS — H52223 Regular astigmatism, bilateral: Secondary | ICD-10-CM | POA: Diagnosis not present

## 2018-08-11 DIAGNOSIS — H25093 Other age-related incipient cataract, bilateral: Secondary | ICD-10-CM | POA: Diagnosis not present

## 2018-08-11 DIAGNOSIS — H5203 Hypermetropia, bilateral: Secondary | ICD-10-CM | POA: Diagnosis not present

## 2018-12-04 DIAGNOSIS — M25551 Pain in right hip: Secondary | ICD-10-CM | POA: Diagnosis not present

## 2018-12-04 DIAGNOSIS — M25552 Pain in left hip: Secondary | ICD-10-CM | POA: Diagnosis not present

## 2018-12-09 DIAGNOSIS — M25511 Pain in right shoulder: Secondary | ICD-10-CM | POA: Diagnosis not present

## 2018-12-09 DIAGNOSIS — M19011 Primary osteoarthritis, right shoulder: Secondary | ICD-10-CM | POA: Diagnosis not present

## 2019-01-04 DIAGNOSIS — D1800 Hemangioma unspecified site: Secondary | ICD-10-CM | POA: Diagnosis not present

## 2019-01-04 DIAGNOSIS — Z1283 Encounter for screening for malignant neoplasm of skin: Secondary | ICD-10-CM | POA: Diagnosis not present

## 2019-01-04 DIAGNOSIS — D2261 Melanocytic nevi of right upper limb, including shoulder: Secondary | ICD-10-CM | POA: Diagnosis not present

## 2019-01-04 DIAGNOSIS — L239 Allergic contact dermatitis, unspecified cause: Secondary | ICD-10-CM | POA: Diagnosis not present

## 2019-01-04 DIAGNOSIS — D485 Neoplasm of uncertain behavior of skin: Secondary | ICD-10-CM | POA: Diagnosis not present

## 2019-01-04 DIAGNOSIS — I8393 Asymptomatic varicose veins of bilateral lower extremities: Secondary | ICD-10-CM | POA: Diagnosis not present

## 2019-01-04 DIAGNOSIS — L720 Epidermal cyst: Secondary | ICD-10-CM | POA: Diagnosis not present

## 2019-01-04 DIAGNOSIS — L821 Other seborrheic keratosis: Secondary | ICD-10-CM | POA: Diagnosis not present

## 2019-01-04 DIAGNOSIS — L80 Vitiligo: Secondary | ICD-10-CM | POA: Diagnosis not present

## 2019-01-04 DIAGNOSIS — B351 Tinea unguium: Secondary | ICD-10-CM | POA: Diagnosis not present

## 2019-01-04 DIAGNOSIS — D239 Other benign neoplasm of skin, unspecified: Secondary | ICD-10-CM | POA: Diagnosis not present

## 2019-01-04 DIAGNOSIS — L812 Freckles: Secondary | ICD-10-CM | POA: Diagnosis not present

## 2019-01-05 ENCOUNTER — Ambulatory Visit (INDEPENDENT_AMBULATORY_CARE_PROVIDER_SITE_OTHER): Payer: Medicare Other | Admitting: Podiatry

## 2019-01-05 ENCOUNTER — Encounter: Payer: Self-pay | Admitting: Podiatry

## 2019-01-05 ENCOUNTER — Other Ambulatory Visit: Payer: Self-pay

## 2019-01-05 VITALS — Temp 98.6°F

## 2019-01-05 DIAGNOSIS — L603 Nail dystrophy: Secondary | ICD-10-CM | POA: Diagnosis not present

## 2019-01-06 NOTE — Progress Notes (Signed)
She presents today for follow-up of her bacterial nail infection.  She has been using topical meds from our office and was treated with laser.  Her dermatologist has prescribed kerydin and she wants to know whether or not she should purchase this or not.  Objective: Vital signs are stable alert and oriented x3.  Pulses are palpable.  She has greater than 75% clearing of her nails.  No open lesions or wounds.  Assessment: Well-healing onychomycosis.  Plan: Continue the topical medicine that her dermatologist has prescribed and follow-up with me in a few months if she desires if this is not resolving.

## 2019-01-11 ENCOUNTER — Ambulatory Visit: Payer: Medicare Other | Admitting: Family Medicine

## 2019-01-20 ENCOUNTER — Telehealth: Payer: Self-pay | Admitting: *Deleted

## 2019-01-20 NOTE — Telephone Encounter (Signed)
Pt states her dermatologist recommended Keradyn for her toenails but it would cost $300.00/month, and she has been on the [employer] Antifungal for a long time and was told the active ingredients can age out and not be effective, and she would like to know what Dr. Milinda Pointer would like her to do.

## 2019-01-21 ENCOUNTER — Telehealth: Payer: Self-pay | Admitting: Podiatry

## 2019-01-25 MED ORDER — NONFORMULARY OR COMPOUNDED ITEM: 11 refills | Status: DC

## 2019-01-25 NOTE — Telephone Encounter (Signed)
Entered in error

## 2019-01-25 NOTE — Telephone Encounter (Signed)
Left message for pt to call to discuss Dr. Stephenie Acres orders.

## 2019-01-25 NOTE — Telephone Encounter (Signed)
Send in refill to [facility] and have her continue with her current therapy

## 2019-01-25 NOTE — Addendum Note (Signed)
Addended by: Harriett Sine D on: 01/25/2019 04:49 PM   Modules accepted: Orders

## 2019-01-25 NOTE — Telephone Encounter (Signed)
Pt called again

## 2019-01-25 NOTE — Telephone Encounter (Signed)
I informed pt of Dr Stephenie Acres orders to continue on the [employer] antifungal topical. Faxed orders to [facility].

## 2019-02-10 ENCOUNTER — Other Ambulatory Visit: Payer: Self-pay

## 2019-02-10 ENCOUNTER — Telehealth: Payer: Self-pay | Admitting: Cardiovascular Disease

## 2019-02-10 ENCOUNTER — Ambulatory Visit (INDEPENDENT_AMBULATORY_CARE_PROVIDER_SITE_OTHER): Payer: Medicare Other | Admitting: Family Medicine

## 2019-02-10 ENCOUNTER — Ambulatory Visit (INDEPENDENT_AMBULATORY_CARE_PROVIDER_SITE_OTHER): Payer: Medicare Other

## 2019-02-10 ENCOUNTER — Encounter: Payer: Self-pay | Admitting: Family Medicine

## 2019-02-10 VITALS — BP 138/62 | HR 81 | Temp 98.0°F | Resp 12 | Wt 123.0 lb

## 2019-02-10 DIAGNOSIS — M545 Low back pain, unspecified: Secondary | ICD-10-CM

## 2019-02-10 DIAGNOSIS — R109 Unspecified abdominal pain: Secondary | ICD-10-CM

## 2019-02-10 DIAGNOSIS — R319 Hematuria, unspecified: Secondary | ICD-10-CM | POA: Diagnosis not present

## 2019-02-10 DIAGNOSIS — M47816 Spondylosis without myelopathy or radiculopathy, lumbar region: Secondary | ICD-10-CM | POA: Diagnosis not present

## 2019-02-10 DIAGNOSIS — M4186 Other forms of scoliosis, lumbar region: Secondary | ICD-10-CM | POA: Diagnosis not present

## 2019-02-10 LAB — POCT URINALYSIS DIP (MANUAL ENTRY)
Bilirubin, UA: NEGATIVE
Glucose, UA: NEGATIVE mg/dL
Nitrite, UA: NEGATIVE
Protein Ur, POC: NEGATIVE mg/dL
Spec Grav, UA: 1.025 (ref 1.010–1.025)
Urobilinogen, UA: 0.2 E.U./dL
pH, UA: 5.5 (ref 5.0–8.0)

## 2019-02-10 IMAGING — DX LUMBAR SPINE - COMPLETE 4+ VIEW
5 series · 5 of 5 positions shown · non-contrast
Comparison: None.

CLINICAL DATA: Lower back pain and recurrent right flank pain

EXAM:
LUMBAR SPINE - COMPLETE 4+ VIEW

[l-spine ap]
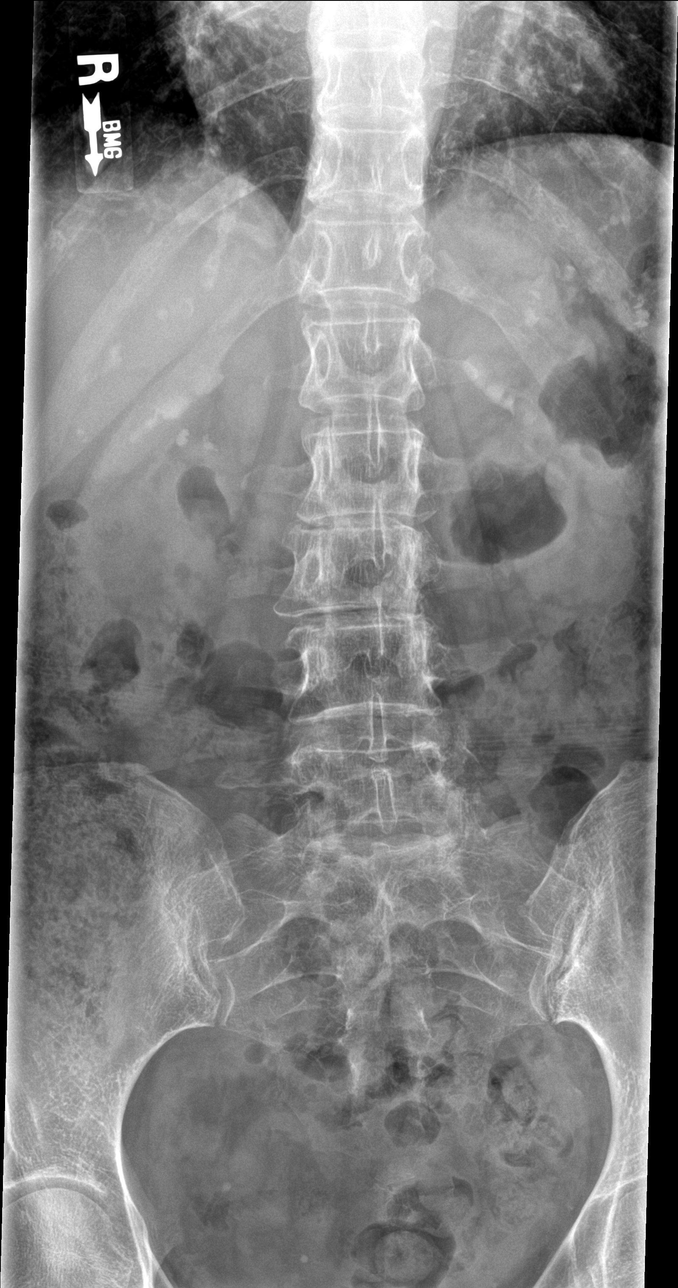

[l-spine obl (1 of 2)]
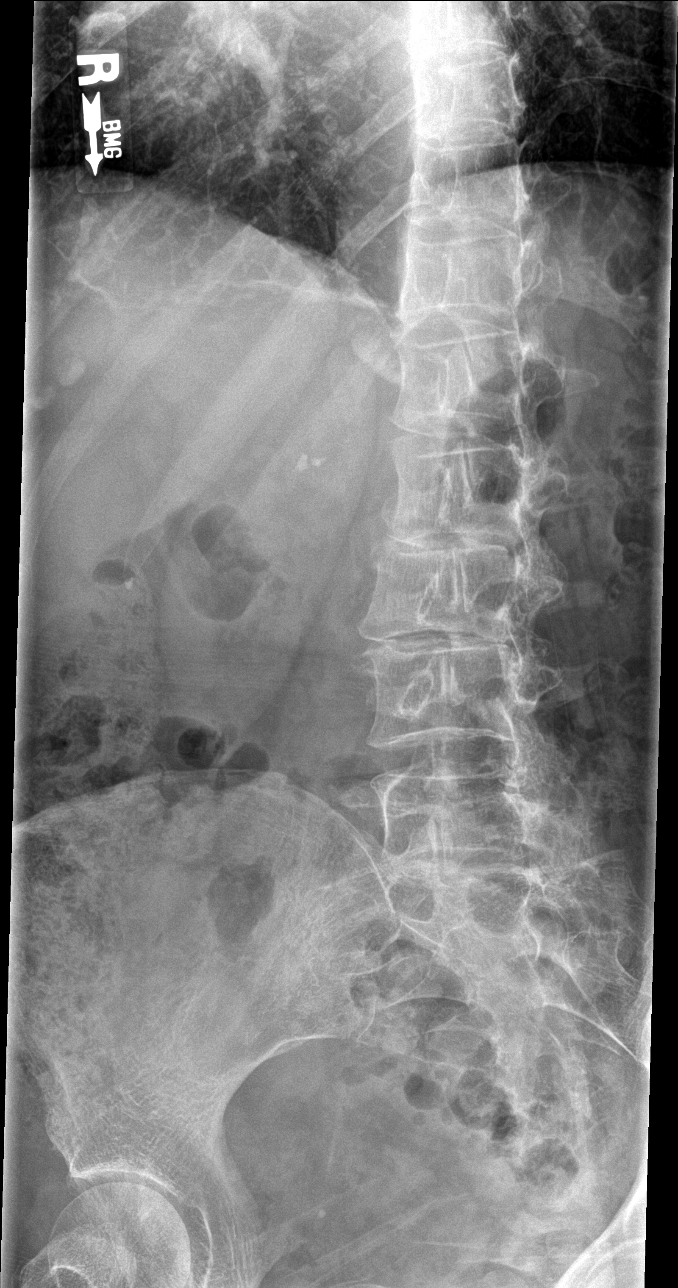

[l-spine obl (2 of 2)]
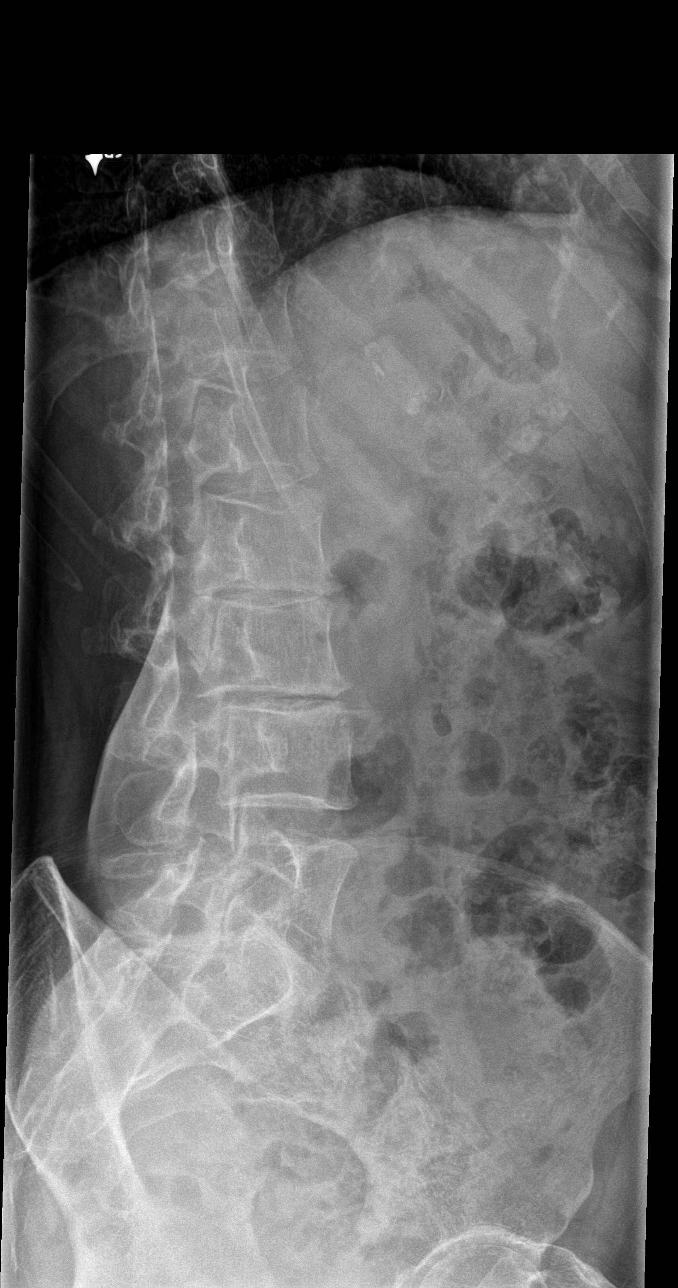

[l-spine lat]
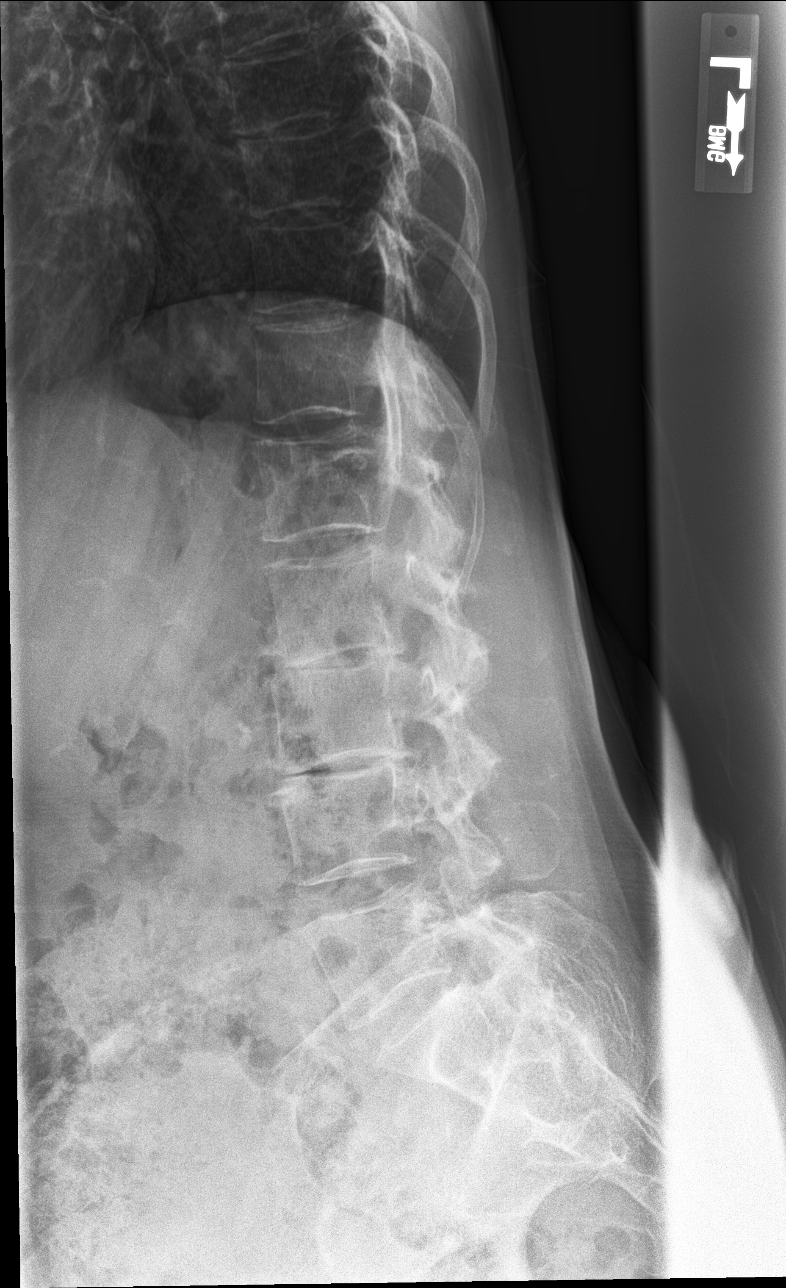

[l-spine l5-s1]
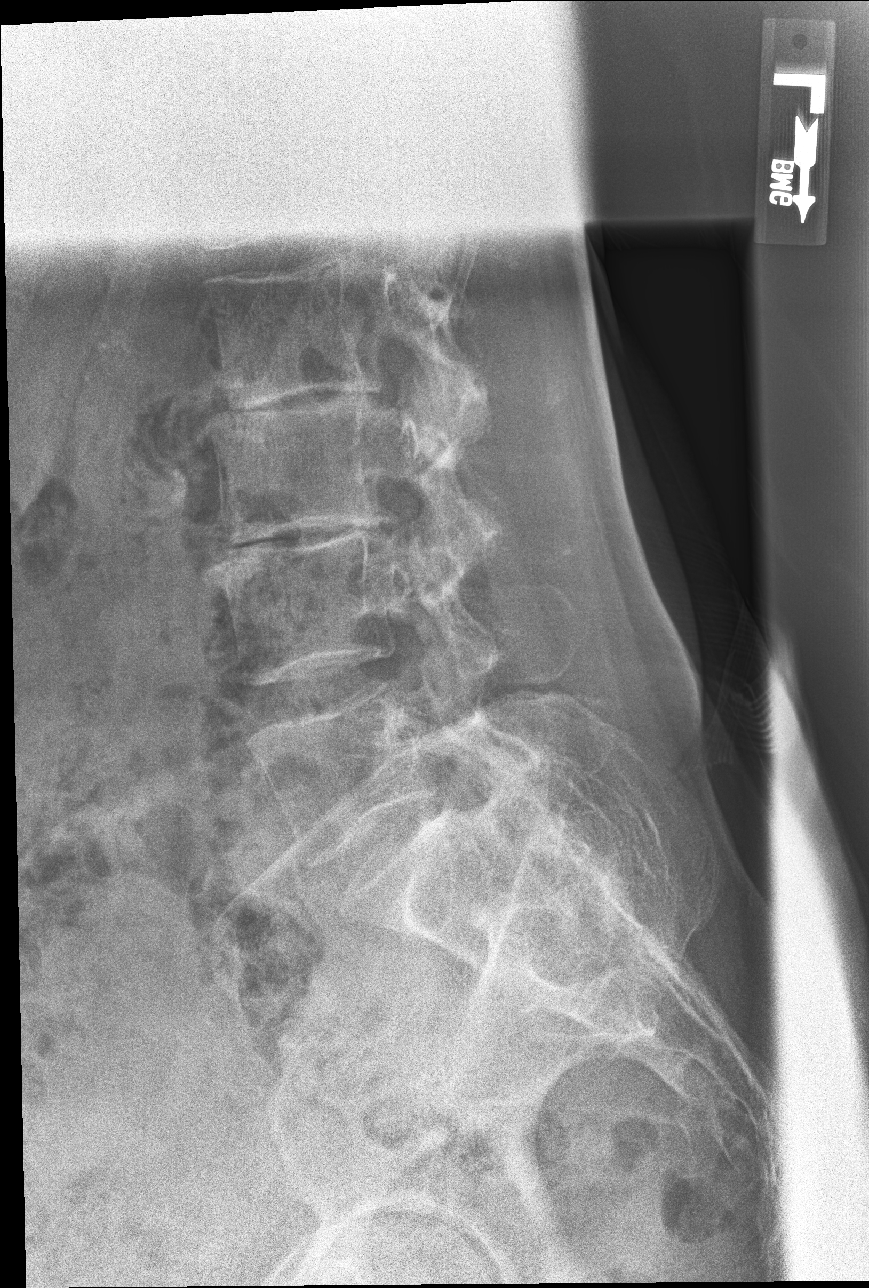

[5 of 5 positions shown; findings below may reference images not displayed]

FINDINGS: There is a mild dextroconvex scoliotic curvature. There is a minimal
retrolisthesis of L2 on L3 and L3 on L4. The vertebral body heights
are well maintained. Disc height loss and facet arthrosis are most
notable at L2-L3 and L3-L4. There is diffuse osteopenia.
IMPRESSION: No acute fracture or malalignment.

Lumbar spine spondylosis most notable at L2-L3 and L3-L4

Minimal retrolisthesis of L2 on L3 and L3 on L4.

## 2019-02-10 NOTE — Telephone Encounter (Signed)
Pt stated she saw her PCP today for back and shoulder pain, and he recommended she take 200 mg -400 mg of Ibuprofen every 6-8 hours, but for her to call Dr. Claiborne Billings first.   Pt stated she does not take the ASA 325 regularly only sometimes and most time she take 81 mg.   She is going to take 400 mg tonight before bed to see if even helps our office will reach out tomorrow to verify with her it is okay to take reguarlly once discussed with pharmacy.   Pt agreed with plan no additional questions at this time.

## 2019-02-10 NOTE — Telephone Encounter (Signed)
New Message:    Pt wants to know if she can take Ibuprohm for her back?

## 2019-02-10 NOTE — Progress Notes (Signed)
Subjective:    Patient ID: Cheyenne Sanders, female    DOB: [birth year], 68 y.o.   MRN: [id number]  HPI Cheyenne Sanders is a 68 y.o. female Presents today for: Chief Complaint  Patient presents with  . Flank Pain    Patient have right side flank pain that has been going on for a few months now. low back started huring 02/06/2019   R flank pain: Present past year.  Off and on pain.  Radiates toward breast surgical area at times in past, not currently.  Discussed with PCP, and discussed with cardiology Had CXR in past - 05/2018, at time of bronchitis.  Degenerative changes in the spine, and osteoporosis.  Intermittent flank on R, then sore into back. Worse past few days into R lower back. Mid low back. Pain with movement only not at rest.  Had seen orthopedist for R hip pain, thought was hamstring.  No fever,wt loss, night sweats.  No rash in area.  No chest pain. No dysuria, frequency, hematuria.  Hist of kidney stones - last passed about 3-4 years ago. Dr. Rosana Hoes urologist. No recent urology eval. Does not feel like kidney stone symptoms in past.    No PUD or CKD.   history of osteopenia on BMD in 2004  Patient Active Problem List   Diagnosis Date Noted  . Skin infection 11/25/2017  . Tick bite 11/25/2017  . Flank pain 09/06/2016  . Recurrent nephrolithiasis 08/27/2016  . Personal history of breast cancer 08/27/2016  . Coronary artery disease involving native coronary artery of native heart without angina pectoris 06/28/2015  . Essential hypertension 06/28/2015  . Mixed hyperlipidemia 06/28/2015  . Precordial chest pain 06/28/2015   Past Medical History:  Diagnosis Date  . Breast cancer ([place]) 4098,[id number]   R breast cancer x 2; lumpectomy with radiation, chemotherapy; mastectomy R.  . Nephrolithiasis   . Personal history of chemotherapy   . Personal history of radiation therapy    Past Surgical History:  Procedure Laterality Date  . ABDOMINAL HYSTERECTOMY  1986   DUB; ovaries intact.  [facility] Kitchen BREAST BIOPSY Left 2016  . BREAST SURGERY Right 2014  . cardiac stenting  07/15/1989  . MASTECTOMY Right    2014   Allergies  Allergen Reactions  . Fentanyl Shortness Of Breath   Prior to Admission medications   Medication Sig Start Date End Date Taking? Authorizing Provider  aspirin 325 MG tablet Take 325 mg by mouth as needed.   Yes [provider]  NONFORMULARY OR COMPOUNDED [place]:  Onychomycosis Nail Lacquer - Fluconazole 2%, Terbinafine 1%, DMSO, apply to affected area daily. 01/07/18  Yes Hyatt, Max T, DPM  triamcinolone cream (KENALOG) 0.1 % Apply 1 application topically 2 (two) times daily. 06/01/18  Yes Rutherford Guys, MD   Social History   Socioeconomic History  . Marital status: Widowed    Spouse name: Not on file  . Number of children: 2  . Years of education: Not on file  . Highest education level: Not on file  Occupational History  . Not on file  Social Needs  . Financial resource strain: Not on file  . Food insecurity    Worry: Not on file    Inability: Not on file  . Transportation needs    Medical: Not on file    Non-medical: Not on file  Tobacco Use  . Smoking status: Never Smoker  . Smokeless tobacco: Never Used  Substance and Sexual Activity  .  Alcohol use: Yes    Alcohol/week: 1.0 standard drinks    Types: 1 Glasses of wine per week  . Drug use: No  . Sexual activity: Not Currently  Lifestyle  . Physical activity    Days per week: Not on file    Minutes per session: Not on file  . Stress: Not on file  Relationships  . Social Herbalist on phone: Not on file    Gets together: Not on file    Attends religious service: Not on file    Active member of club or organization: Not on file    Attends meetings of clubs or organizations: Not on file    Relationship status: Not on file  . Intimate partner violence    Fear of current or ex partner: Not on file    Emotionally abused: Not on  file    Physically abused: Not on file    Forced sexual activity: Not on file  Other Topics Concern  . 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      Tobacco: none      Alcohol: rare; social      Exercise: no formal exercise.        ADLs: independent with ADLs    Review of Systems Per HPI.     Objective:   Physical Exam Constitutional:      General: She is not in acute distress.    Appearance: She is well-developed.  HENT:     Head: Normocephalic and atraumatic.  Cardiovascular:     Rate and Rhythm: Normal rate and regular rhythm.  Pulmonary:     Effort: Pulmonary effort is normal. No respiratory distress.     Breath sounds: No rales.  Abdominal:     General: There is no distension.     Tenderness: There is no abdominal tenderness. There is no right CVA tenderness, left CVA tenderness or guarding.  Musculoskeletal:     Lumbar back: She exhibits decreased range of motion (min decr ROM. slight stiffness, soreness in low back with rotation, flexion at 90. negative seated SLR. ). She exhibits no tenderness, no bony tenderness, no swelling, no edema, no pain and no spasm.       Back:  Skin:    General: Skin is warm and dry.     Findings: No rash.  Neurological:     Mental Status: She is alert and oriented to person, place, and time.    Vitals:   02/10/19 1413  BP: 138/62  Pulse: 81  Resp: 12  Temp: 98 F (36.7 C)  TempSrc: Oral  SpO2: 96%  Weight: 123 lb (55.8 kg)    Results for orders placed or performed in visit on 02/10/19  POCT urinalysis dipstick  Result Value Ref Range   Color, UA yellow yellow   Clarity, UA clear clear   Glucose, UA negative negative mg/dL   Bilirubin, UA negative negative   Ketones, POC UA trace  (5) (A) negative mg/dL   Spec Grav, UA 1.025 1.010 - 1.025   Blood, UA trace-lysed (A) negative   pH, UA 5.5 5.0 - 8.0   Protein Ur, POC negative negative mg/dL  Urobilinogen, UA 0.2 0.2 or 1.0 E.U./dL   Nitrite, UA Negative Negative   Leukocytes, UA Trace (A) Negative   Dg Lumbar Spine Complete  Result Date: 02/10/2019 CLINICAL DATA:  Lower back pain and recurrent right flank pain EXAM: LUMBAR SPINE - COMPLETE 4+ VIEW COMPARISON:  None. FINDINGS: There is a mild dextroconvex scoliotic curvature. There is a minimal retrolisthesis of L2 on L3 and L3 on L4. The vertebral body heights are well maintained. Disc height loss and facet arthrosis are most notable at L2-L3 and L3-L4. There is diffuse osteopenia. IMPRESSION: No acute fracture or malalignment. Lumbar spine spondylosis most notable at L2-L3 and L3-L4 Minimal retrolisthesis of L2 on L3 and L3 on L4. Electronically Signed   By: Prudencio Pair M.D.   On: 02/10/2019 15:30       Assessment & Plan:  PRAKRITI CARIGNAN is a 68 y.o. female Flank pain - Plan: POCT urinalysis dipstick, DG Lumbar Spine Complete  Midline low back pain without sciatica, unspecified chronicity - Plan: DG Lumbar Spine Complete  Hematuria, unspecified type - Plan: Urine Culture Suspected degenerative disc disease as primary cause of low back pain.  Less likely nephrolith but trace hematuria.  History of osteopenia, nerve compression fracture seen.  -  Check urine culture, but otherwise asymptomatic at this time.  Consider CT scanning if persistent for pain with given history of nephrolithiasis.  -Symptomatic care with Tylenol, heat or ice as needed, range of motion, glucosamine/chondroitin trial for arthritis as that may also help other areas.  NSAIDs discussed, but would like her to discuss first with cardiology and short course if needed.   -recheck in 2 weeks with primary provider for ongoing flank pain, RTC/ER precautions given if worsening.   No orders of the  defined types were placed in this encounter.  Patient Instructions   Low back pain may be related to some degenerative changes or arthritis.  See information below on treatment of low back pain, but for now can try heat or ice with gentle range of motion and stretching throughout the day.  Over-the-counter Tylenol is okay, and can also try glucosamine/chondroitin supplement which can sometimes help arthritis, and may help other areas if arthritis is a problem as well.  If that is not helping symptoms within 6 weeks, would not continue it past that time.  Could consider Advil temporarily, but would recommend you discuss that with your cardiologist first to make sure they are okay with you taking that medicine for a short period.  Please follow-up with Dr. Pamella Pert in the next few weeks to discuss flank pain and further work-up.  I will check a urine culture because the urine test today did have a few possible blood cells as well as infection fighting cells, but your current symptoms do not sound like urinary tract infection.    As we discussed kidney stone can sometimes present with flank pain, and blood in the urine so CT scan may also be needed.  If your symptoms are worsening in the next few days, or not showing signs of improvement this week, I would recommend CT scan to rule out a kidney stone.  Let me know.    Return to the clinic or go to the nearest emergency room if any of your symptoms worsen or new symptoms occur.   Acute Back Pain, Adult Acute back pain is sudden and usually short-lived. It is often caused by an injury to the muscles and tissues in the back. The injury  may result from:  A muscle or ligament getting overstretched or torn (strained). Ligaments are tissues that connect bones to each other. Lifting something improperly can cause a back strain.  Wear and tear (degeneration) of the spinal disks. Spinal disks are circular tissue that provides cushioning between the bones of the  spine (vertebrae).  Twisting motions, such as while playing sports or doing yard work.  A hit to the back.  Arthritis. You may have a physical exam, lab tests, and imaging tests to find the cause of your pain. Acute back pain usually goes away with rest and home care. Follow these instructions at home: Managing pain, stiffness, and swelling  Take over-the-counter and prescription medicines only as told by your health care provider.  Your health care provider may recommend applying ice during the first 24-48 hours after your pain starts. To do this: ? Put ice in a plastic bag. ? Place a towel between your skin and the bag. ? Leave the ice on for 20 minutes, 2-3 times a day.  If directed, apply heat to the affected area as often as told by your health care provider. Use the heat source that your health care provider recommends, such as a moist heat pack or a heating pad. ? Place a towel between your skin and the heat source. ? Leave the heat on for 20-30 minutes. ? Remove the heat if your skin turns bright red. This is especially important if you are unable to feel pain, heat, or cold. You have a greater risk of getting burned. Activity   Do not stay in bed. Staying in bed for more than 1-2 days can delay your recovery.  Sit up and stand up straight. Avoid leaning forward when you sit, or hunching over when you stand. ? If you work at a desk, sit close to it so you do not need to lean over. Keep your chin tucked in. Keep your neck drawn back, and keep your elbows bent at a right angle. Your arms should look like the letter "L." ? Sit high and close to the steering wheel when you drive. Add lower back (lumbar) support to your car seat, if needed.  Take short walks on even surfaces as soon as you are able. Try to increase the length of time you walk each day.  Do not sit, drive, or stand in one place for more than 30 minutes at a time. Sitting or standing for long periods of time can put  stress on your back.  Do not drive or use heavy machinery while taking prescription pain medicine.  Use proper lifting techniques. When you bend and lift, use positions that put less stress on your back: ? [place] your knees. ? Keep the load close to your body. ? Avoid twisting.  Exercise regularly as told by your health care provider. Exercising helps your back heal faster and helps prevent back injuries by keeping muscles strong and flexible.  Work with a physical therapist to make a safe exercise program, as recommended by your health care provider. Do any exercises as told by your physical therapist. Lifestyle  Maintain a healthy weight. Extra weight puts stress on your back and makes it difficult to have good posture.  Avoid activities or situations that make you feel anxious or stressed. Stress and anxiety increase muscle tension and can make back pain worse. Learn ways to manage anxiety and stress, such as through exercise. General instructions  Sleep on a firm mattress in a  comfortable position. Try lying on your side with your knees slightly bent. If you lie on your back, put a pillow under your knees.  Follow your treatment plan as told by your health care provider. This may include: ? Cognitive or behavioral therapy. ? Acupuncture or massage therapy. ? Meditation or yoga. Contact a health care provider if:  You have pain that is not relieved with rest or medicine.  You have increasing pain going down into your legs or buttocks.  Your pain does not improve after 2 weeks.  You have pain at night.  You lose weight without trying.  You have a fever or chills. Get help right away if:  You develop new bowel or bladder control problems.  You have unusual weakness or numbness in your arms or legs.  You develop nausea or vomiting.  You develop abdominal pain.  You feel faint. Summary  Acute back pain is sudden and usually short-lived.  Use proper lifting  techniques. When you bend and lift, use positions that put less stress on your back.  Take over-the-counter and prescription medicines and apply heat or ice as directed by your health care provider. This information is not intended to replace advice given to you by your health care provider. Make sure you discuss any questions you have with your health care provider. Document Released: 07/01/2005 Document Revised: 10/20/2018 Document Reviewed: 02/12/2017 Elsevier Patient Education  [employer].      If you have lab work done today you will be contacted with your lab results within the next 2 weeks.  If you have not heard from [place] then please contact us. The fastest way to get your results is to register for My Chart.   IF you received an x-ray today, you will receive an invoice from [facility]. Please contact [facility] at [phone] with questions or concerns regarding your invoice.   IF you received labwork today, you will receive an invoice from [facility]. Please contact [facility] at [phone] with questions or concerns regarding your invoice.   Our billing staff will not be able to assist you with questions regarding bills from these companies.  You will be contacted with the lab results as soon as they are available. The fastest way to get your results is to activate your My Chart account. Instructions are located on the last page of this paperwork. If you have not heard from [place] regarding the results in 2 weeks, please contact this office.       Signed,   [name], MD [facility].  02/10/19 3:34 PM

## 2019-02-10 NOTE — Progress Notes (Signed)
102

## 2019-02-10 NOTE — Patient Instructions (Addendum)
Low back pain may be related to some degenerative changes or arthritis.  See information below on treatment of low back pain, but for now can try heat or ice with gentle range of motion and stretching throughout the day.  Over-the-counter Tylenol is okay, and can also try glucosamine/chondroitin supplement which can sometimes help arthritis, and may help other areas if arthritis is a problem as well.  If that is not helping symptoms within 6 weeks, would not continue it past that time.  Could consider Advil temporarily, but would recommend you discuss that with your cardiologist first to make sure they are okay with you taking that medicine for a short period.  Please follow-up with Dr. Pamella Pert in the next few weeks to discuss flank pain and further work-up.  I will check a urine culture because the urine test today did have a few possible blood cells as well as infection fighting cells, but your current symptoms do not sound like urinary tract infection.    As we discussed kidney stone can sometimes present with flank pain, and blood in the urine so CT scan may also be needed.  If your symptoms are worsening in the next few days, or not showing signs of improvement this week, I would recommend CT scan to rule out a kidney stone.  Let me know.    Return to the clinic or go to the nearest emergency room if any of your symptoms worsen or new symptoms occur.   Acute Back Pain, Adult Acute back pain is sudden and usually short-lived. It is often caused by an injury to the muscles and tissues in the back. The injury may result from:  A muscle or ligament getting overstretched or torn (strained). Ligaments are tissues that connect bones to each other. Lifting something improperly can cause a back strain.  Wear and tear (degeneration) of the spinal disks. Spinal disks are circular tissue that provides cushioning between the bones of the spine (vertebrae).  Twisting motions, such as while playing sports or  doing yard work.  A hit to the back.  Arthritis. You may have a physical exam, lab tests, and imaging tests to find the cause of your pain. Acute back pain usually goes away with rest and home care. Follow these instructions at home: Managing pain, stiffness, and swelling  Take over-the-counter and prescription medicines only as told by your health care provider.  Your health care provider may recommend applying ice during the first 24-48 hours after your pain starts. To do this: ? Put ice in a plastic bag. ? Place a towel between your skin and the bag. ? Leave the ice on for 20 minutes, 2-3 times a day.  If directed, apply heat to the affected area as often as told by your health care provider. Use the heat source that your health care provider recommends, such as a moist heat pack or a heating pad. ? Place a towel between your skin and the heat source. ? Leave the heat on for 20-30 minutes. ? Remove the heat if your skin turns bright red. This is especially important if you are unable to feel pain, heat, or cold. You have a greater risk of getting burned. Activity   Do not stay in bed. Staying in bed for more than 1-2 days can delay your recovery.  Sit up and stand up straight. Avoid leaning forward when you sit, or hunching over when you stand. ? If you work at a desk, sit close to  it so you do not need to lean over. Keep your chin tucked in. Keep your neck drawn back, and keep your elbows bent at a right angle. Your arms should look like the letter "L." ? Sit high and close to the steering wheel when you drive. Add lower back (lumbar) support to your car seat, if needed.  Take short walks on even surfaces as soon as you are able. Try to increase the length of time you walk each day.  Do not sit, drive, or stand in one place for more than 30 minutes at a time. Sitting or standing for long periods of time can put stress on your back.  Do not drive or use heavy machinery while  taking prescription pain medicine.  Use proper lifting techniques. When you bend and lift, use positions that put less stress on your back: ? [place] your knees. ? Keep the load close to your body. ? Avoid twisting.  Exercise regularly as told by your health care provider. Exercising helps your back heal faster and helps prevent back injuries by keeping muscles strong and flexible.  Work with a physical therapist to make a safe exercise program, as recommended by your health care provider. Do any exercises as told by your physical therapist. Lifestyle  Maintain a healthy weight. Extra weight puts stress on your back and makes it difficult to have good posture.  Avoid activities or situations that make you feel anxious or stressed. Stress and anxiety increase muscle tension and can make back pain worse. Learn ways to manage anxiety and stress, such as through exercise. General instructions  Sleep on a firm mattress in a comfortable position. Try lying on your side with your knees slightly bent. If you lie on your back, put a pillow under your knees.  Follow your treatment plan as told by your health care provider. This may include: ? Cognitive or behavioral therapy. ? Acupuncture or massage therapy. ? Meditation or yoga. Contact a health care provider if:  You have pain that is not relieved with rest or medicine.  You have increasing pain going down into your legs or buttocks.  Your pain does not improve after 2 weeks.  You have pain at night.  You lose weight without trying.  You have a fever or chills. Get help right away if:  You develop new bowel or bladder control problems.  You have unusual weakness or numbness in your arms or legs.  You develop nausea or vomiting.  You develop abdominal pain.  You feel faint. Summary  Acute back pain is sudden and usually short-lived.  Use proper lifting techniques. When you bend and lift, use positions that put less stress on  your back.  Take over-the-counter and prescription medicines and apply heat or ice as directed by your health care provider. This information is not intended to replace advice given to you by your health care provider. Make sure you discuss any questions you have with your health care provider. Document Released: 07/01/2005 Document Revised: 10/20/2018 Document Reviewed: 02/12/2017 Elsevier Patient Education  [employer].      If you have lab work done today you will be contacted with your lab results within the next 2 weeks.  If you have not heard from [place] then please contact us. The fastest way to get your results is to register for My Chart.   IF you received an x-ray today, you will receive an invoice from [facility]. Please contact [facility] at  [phone] with questions or concerns regarding your invoice.   IF you received labwork today, you will receive an invoice from [facility]. Please contact [facility] at [phone] with questions or concerns regarding your invoice.   Our billing staff will not be able to assist you with questions regarding bills from these companies.  You will be contacted with the lab results as soon as they are available. The fastest way to get your results is to activate your My Chart account. Instructions are located on the last page of this paperwork. If you have not heard from [place] regarding the results in 2 weeks, please contact this office.

## 2019-02-11 NOTE — Telephone Encounter (Signed)
Left a message for the patient to call back.  

## 2019-02-11 NOTE — Telephone Encounter (Signed)
Call returned to the patient to check on the pain. She stated that she took 400 mg of Ibuprofen last night and then 400 mg this morning. This did help with the pain but she wants to make sure it is okay for her to take.

## 2019-02-12 NOTE — Telephone Encounter (Signed)
Patient made aware and verbalized her understanding. 

## 2019-02-12 NOTE — Telephone Encounter (Signed)
She is fine to take ibuprofen for 3-5 days until pain reduced

## 2019-02-13 LAB — URINE CULTURE

## 2019-02-14 ENCOUNTER — Other Ambulatory Visit: Payer: Self-pay | Admitting: Family Medicine

## 2019-02-14 DIAGNOSIS — N390 Urinary tract infection, site not specified: Secondary | ICD-10-CM

## 2019-02-14 DIAGNOSIS — B9620 Unspecified Escherichia coli [E. coli] as the cause of diseases classified elsewhere: Secondary | ICD-10-CM

## 2019-02-14 MED ORDER — CEPHALEXIN 500 MG PO CAPS: 500.0000 mg | ORAL_CAPSULE | Freq: Two times a day (BID) | ORAL | 0 refills | Status: DC

## 2019-02-19 DIAGNOSIS — M47816 Spondylosis without myelopathy or radiculopathy, lumbar region: Secondary | ICD-10-CM | POA: Diagnosis not present

## 2019-02-25 ENCOUNTER — Ambulatory Visit (INDEPENDENT_AMBULATORY_CARE_PROVIDER_SITE_OTHER): Payer: Medicare Other | Admitting: Family Medicine

## 2019-02-25 ENCOUNTER — Encounter: Payer: Self-pay | Admitting: Family Medicine

## 2019-02-25 ENCOUNTER — Other Ambulatory Visit: Payer: Self-pay

## 2019-02-25 VITALS — BP 100/60 | HR 73 | Temp 98.7°F | Ht 64.37 in | Wt 122.0 lb

## 2019-02-25 DIAGNOSIS — E782 Mixed hyperlipidemia: Secondary | ICD-10-CM

## 2019-02-25 DIAGNOSIS — Z23 Encounter for immunization: Secondary | ICD-10-CM

## 2019-02-25 DIAGNOSIS — B9620 Unspecified Escherichia coli [E. coli] as the cause of diseases classified elsewhere: Secondary | ICD-10-CM

## 2019-02-25 DIAGNOSIS — G8929 Other chronic pain: Secondary | ICD-10-CM | POA: Diagnosis not present

## 2019-02-25 DIAGNOSIS — N390 Urinary tract infection, site not specified: Secondary | ICD-10-CM

## 2019-02-25 DIAGNOSIS — M25551 Pain in right hip: Secondary | ICD-10-CM | POA: Diagnosis not present

## 2019-02-25 DIAGNOSIS — R109 Unspecified abdominal pain: Secondary | ICD-10-CM | POA: Diagnosis not present

## 2019-02-25 LAB — POCT URINALYSIS DIP (MANUAL ENTRY)
Bilirubin, UA: NEGATIVE
Blood, UA: NEGATIVE
Glucose, UA: NEGATIVE mg/dL
Ketones, POC UA: NEGATIVE mg/dL
Nitrite, UA: NEGATIVE
Protein Ur, POC: NEGATIVE mg/dL
Spec Grav, UA: 1.015 (ref 1.010–1.025)
Urobilinogen, UA: 0.2 E.U./dL
pH, UA: 5.5 (ref 5.0–8.0)

## 2019-02-25 LAB — POC MICROSCOPIC URINALYSIS (UMFC): Mucus: ABSENT

## 2019-02-25 NOTE — Progress Notes (Signed)
8/13/20204:11 PM  Cheyenne Sanders 01-Oct-1950, 68 y.o., female [id number]  Chief Complaint  Patient presents with  . Follow-up    flank pain, also wants to discuss the therapy she hs coming up with the ortho doctor. Notes are in the chart    HPI:   Patient is a 68 y.o. female who presents today for followup on flank pain  Seen July 29th for similar sx by Dr Nyoka Cowden Ur cx  + e coli, rx with keflex x 7 days but patient did not take the anx Xray DDD: spondylosis with minimal rethrolithesis L2-L3, L3-L4 H/o kidney stones Saw Dr Lynford Humphrey at [facility] last week, MRI and PT ordered She would rather do PT for her right hip - see has reached out to Dr Ricki Rodriguez, whom she sees for her right hip pain, and orders have been placed  Otherwise she has been doing well She manages her HLP and CAD with diet  She is wondering about the pneumonia vaccine  Depression screen [facility] 2/9 02/25/2019 02/10/2019 06/17/2018  Decreased Interest 0 0 0  Down, Depressed, Hopeless 0 0 0  PHQ - 2 Score 0 0 0    Fall Risk  02/10/2019 06/17/2018 06/01/2018 12/15/2017 11/25/2017  Falls in the past year? 0 1 0 Yes No  Number falls in past yr: 0 0 - 1 -  Injury with Fall? 0 0 - - -  Follow up Falls evaluation completed - - - -     Allergies  Allergen Reactions  . Fentanyl Shortness Of Breath    Prior to Admission medications   Medication Sig Start Date End Date Taking? Authorizing Provider  aspirin 325 MG tablet Take 325 mg by mouth as needed.   Yes [provider]  cephALEXin (KEFLEX) 500 MG capsule Take 1 capsule (500 mg total) by mouth 2 (two) times daily. 02/14/19  Yes Wendie Agreste, MD  NONFORMULARY OR COMPOUNDED ITEM [facility]:  Onychomycosis Nail Lacquer - Fluconazole 2%, Terbinafine 1%, DMSO, apply to affected area daily. 01/07/18  Yes Hyatt, Max T, DPM    Past Medical History:  Diagnosis Date  . Breast cancer ([place]) 5053,9767   R breast cancer x 2; lumpectomy with radiation,  chemotherapy; mastectomy R.  . Nephrolithiasis   . Personal history of chemotherapy   . Personal history of radiation therapy     Past Surgical History:  Procedure Laterality Date  . ABDOMINAL HYSTERECTOMY  1986   DUB; ovaries intact.  Marland Kitchen BREAST BIOPSY Left 2016  . BREAST SURGERY Right 2014  . cardiac stenting  07/15/1989  . MASTECTOMY Right    2014    Social History   Tobacco Use  . Smoking status: Never Smoker  . Smokeless tobacco: Never Used  Substance Use Topics  . Alcohol use: Yes    Alcohol/week: 1.0 standard drinks    Types: 1 Glasses of wine per week    Family History  Problem Relation Age of Onset  . Heart disease Mother   . Cancer Father   . Breast cancer Neg Hx     Review of Systems  Constitutional: Negative for chills and fever.  Respiratory: Negative for cough and shortness of breath.   Cardiovascular: Negative for chest pain, palpitations and leg swelling.  Gastrointestinal: Negative for abdominal pain, nausea and vomiting.     OBJECTIVE:  Today's Vitals   02/25/19 1556  BP: 100/60  Pulse: 73  Temp: 98.7 F (37.1 C)  TempSrc: Oral  SpO2: 96%  Weight:  122 lb (55.3 kg)  Height: 5' 4.37" (1.635 m)   Body mass index is 20.7 kg/m.   Physical Exam Vitals signs and nursing note reviewed.  Constitutional:      Appearance: She is well-developed.  HENT:     Head: Normocephalic and atraumatic.     Mouth/Throat:     Pharynx: No oropharyngeal exudate.  Eyes:     General: No scleral icterus.    Conjunctiva/sclera: Conjunctivae normal.     Pupils: Pupils are equal, round, and reactive to light.  Neck:     Musculoskeletal: Neck supple.  Cardiovascular:     Rate and Rhythm: Normal rate and regular rhythm.     Heart sounds: Normal heart sounds. No murmur. No friction rub. No gallop.   Pulmonary:     Effort: Pulmonary effort is normal.     Breath sounds: Normal breath sounds. No wheezing or rales.  Skin:    General: Skin is warm and dry.   Neurological:     Mental Status: She is alert and oriented to person, place, and time.     Results for orders placed or performed in visit on 02/25/19 (from the past 24 hour(s))  POCT urinalysis dipstick     Status: Abnormal   Collection Time: 02/25/19  4:22 PM  Result Value Ref Range   Color, UA yellow yellow   Clarity, UA clear clear   Glucose, UA negative negative mg/dL   Bilirubin, UA negative negative   Ketones, POC UA negative negative mg/dL   Spec Grav, UA 1.015 1.010 - 1.025   Blood, UA negative negative   pH, UA 5.5 5.0 - 8.0   Protein Ur, POC negative negative mg/dL   Urobilinogen, UA 0.2 0.2 or 1.0 E.U./dL   Nitrite, UA Negative Negative   Leukocytes, UA Trace (A) Negative  POCT Microscopic Urinalysis ([facility])     Status: Abnormal   Collection Time: 02/25/19  4:33 PM  Result Value Ref Range   WBC,UR,HPF,POC Few (A) None WBC/hpf   RBC,UR,HPF,POC None None RBC/hpf   Bacteria None None, Too numerous to count   Mucus Absent Absent   Epithelial Cells, UR Per Microscopy Few (A) None, Too numerous to count cells/hpf    No results found.   ASSESSMENT and PLAN  1. Flank pain Chronic, favoring MSK, no protine nor blood in urine, continue conservative measures.  - POCT urinalysis dipstick - POCT Microscopic Urinalysis ([facility])  2. E. coli urinary tract infection Patient never took abx, asymptomatic, ua clear, sending for culture given recent positive culture, ER precautions given - POCT urinalysis dipstick - POCT Microscopic Urinalysis ([facility]) - Urine Culture  3. Mixed hyperlipidemia Managed with diet, labs pending - Comprehensive metabolic panel - Lipid panel - TSH  4. Chronic right hip pain Patient will be starting PT soon, if no improvement advised followup with MRI as planned.   Other orders - Pneumococcal conjugate vaccine 13-valent IM  Return in about 6 months (around 08/28/2019).    Rutherford Guys, MD [facility]  [place], [place] [address] Ph.  [phone] Fax [phone]

## 2019-02-25 NOTE — Patient Instructions (Signed)
° ° ° °  If you have lab work done today you will be contacted with your lab results within the next 2 weeks.  If you have not heard from us then please contact us. The fastest way to get your results is to register for My Chart. ° ° °IF you received an x-ray today, you will receive an invoice from [facility]. Please contact [facility] at [phone] with questions or concerns regarding your invoice.  ° °IF you received labwork today, you will receive an invoice from [facility]. Please contact [facility] at [phone] with questions or concerns regarding your invoice.  ° °Our billing staff will not be able to assist you with questions regarding bills from these companies. ° °You will be contacted with the lab results as soon as they are available. The fastest way to get your results is to activate your My Chart account. Instructions are located on the last page of this paperwork. If you have not heard from us regarding the results in 2 weeks, please contact this office. °  ° ° ° °

## 2019-02-26 LAB — COMPREHENSIVE METABOLIC PANEL
ALT: 30 IU/L (ref 0–32)
AST: 32 IU/L (ref 0–40)
Albumin/Globulin Ratio: 2.3 — ABNORMAL HIGH (ref 1.2–2.2)
Albumin: 4.8 g/dL (ref 3.8–4.8)
Alkaline Phosphatase: 63 IU/L (ref 39–117)
BUN/Creatinine Ratio: 27 (ref 12–28)
BUN: 19 mg/dL (ref 8–27)
Bilirubin Total: 0.5 mg/dL (ref 0.0–1.2)
CO2: 23 mmol/L (ref 20–29)
Calcium: 10.1 mg/dL (ref 8.7–10.3)
Chloride: 103 mmol/L (ref 96–106)
Creatinine, Ser: 0.71 mg/dL (ref 0.57–1.00)
GFR calc Af Amer: 103 mL/min/{1.73_m2} (ref >59)
GFR calc non Af Amer: 89 mL/min/{1.73_m2} (ref >59)
Globulin, Total: 2.1 g/dL (ref 1.5–4.5)
Glucose: 78 mg/dL (ref 65–99)
Potassium: 4.4 mmol/L (ref 3.5–5.2)
Sodium: 142 mmol/L (ref 134–144)
Total Protein: 6.9 g/dL (ref 6.0–8.5)

## 2019-02-26 LAB — LIPID PANEL
Chol/HDL Ratio: 4 ratio (ref 0.0–4.4)
Cholesterol, Total: 247 mg/dL — ABNORMAL HIGH (ref 100–199)
HDL: 62 mg/dL (ref >39)
LDL Calculated: 166 mg/dL — ABNORMAL HIGH (ref 0–99)
Triglycerides: 95 mg/dL (ref 0–149)
VLDL Cholesterol Cal: 19 mg/dL (ref 5–40)

## 2019-02-26 LAB — TSH: TSH: 2.47 u[IU]/mL (ref 0.450–4.500)

## 2019-02-27 LAB — URINE CULTURE

## 2019-03-02 ENCOUNTER — Telehealth: Payer: Self-pay | Admitting: Family Medicine

## 2019-03-02 DIAGNOSIS — M25551 Pain in right hip: Secondary | ICD-10-CM

## 2019-03-02 HISTORY — DX: Pain in right hip: M25.551

## 2019-03-02 NOTE — Telephone Encounter (Signed)
Patient is calling to check status of urine culture. Patient is requesting call back from [place].

## 2019-03-03 NOTE — Telephone Encounter (Signed)
Could you review pt labs so I can give her a call

## 2019-03-04 ENCOUNTER — Telehealth: Payer: Self-pay | Admitting: Family Medicine

## 2019-03-04 NOTE — Telephone Encounter (Signed)
I called patient 

## 2019-03-04 NOTE — Telephone Encounter (Signed)
Wants blood labs and urine culture labs mailed to po box on file.

## 2019-03-09 DIAGNOSIS — M545 Low back pain: Secondary | ICD-10-CM | POA: Diagnosis not present

## 2019-03-09 DIAGNOSIS — M25551 Pain in right hip: Secondary | ICD-10-CM | POA: Diagnosis not present

## 2019-03-09 NOTE — Telephone Encounter (Signed)
Labs printed and places in the box to be mailed

## 2019-03-15 NOTE — Telephone Encounter (Signed)
Labs has been mailed 

## 2019-03-15 NOTE — Telephone Encounter (Signed)
Pt calling for an update on mailing her results. Please advise.

## 2019-03-17 DIAGNOSIS — M545 Low back pain: Secondary | ICD-10-CM | POA: Diagnosis not present

## 2019-03-17 DIAGNOSIS — M25551 Pain in right hip: Secondary | ICD-10-CM | POA: Diagnosis not present

## 2019-05-22 DIAGNOSIS — Z23 Encounter for immunization: Secondary | ICD-10-CM | POA: Diagnosis not present

## 2019-06-03 ENCOUNTER — Other Ambulatory Visit: Payer: Self-pay

## 2019-06-03 ENCOUNTER — Ambulatory Visit (INDEPENDENT_AMBULATORY_CARE_PROVIDER_SITE_OTHER): Payer: Medicare Other | Admitting: Family Medicine

## 2019-06-03 ENCOUNTER — Encounter: Payer: Self-pay | Admitting: Family Medicine

## 2019-06-03 VITALS — BP 131/76 | HR 65 | Temp 98.5°F | Resp 12 | Ht 65.0 in | Wt 127.0 lb

## 2019-06-03 DIAGNOSIS — H6123 Impacted cerumen, bilateral: Secondary | ICD-10-CM | POA: Diagnosis not present

## 2019-06-03 DIAGNOSIS — H61899 Other specified disorders of external ear, unspecified ear: Secondary | ICD-10-CM | POA: Diagnosis not present

## 2019-06-03 MED ORDER — NEOMYCIN-POLYMYXIN-HC 3.5-10000-1 OT SOLN: 3.0000 [drp] | Freq: Four times a day (QID) | OTIC | 0 refills | Status: DC

## 2019-06-03 NOTE — Patient Instructions (Addendum)
See information below on cerumen impaction.  If any persistent soreness in the ear canals tomorrow or the next day, can start the antibiotic drops.  As long as your symptoms are improving you do not need to use the  Drops.  Return to the clinic or go to the nearest emergency room if any of your symptoms worsen or new symptoms occur.   If you have lab work done today you will be contacted with your lab results within the next 2 weeks.  If you have not heard from [place] then please contact us. The fastest way to get your results is to register for My Chart.   IF you received an x-ray today, you will receive an invoice from [facility]. Please contact [facility] at [phone] with questions or concerns regarding your invoice.   IF you received labwork today, you will receive an invoice from [place]. Please contact [facility] at [phone] with questions or concerns regarding your invoice.   Our billing staff will not be able to assist you with questions regarding bills from these companies.  You will be contacted with the lab results as soon as they are available. The fastest way to get your results is to activate your My Chart account. Instructions are located on the last page of this paperwork. If you have not heard from [place] regarding the results in 2 weeks, please contact this office.      Earwax Buildup, Adult The ears produce a substance called earwax that helps keep bacteria out of the ear and protects the skin in the ear canal. Occasionally, earwax can build up in the ear and cause discomfort or hearing loss. What increases the risk? This condition is more likely to develop in people who:  Are female.  Are elderly.  Naturally produce more earwax.  Clean their ears often with cotton swabs.  Use earplugs often.  Use in-ear headphones often.  Wear hearing aids.  Have narrow ear canals.  Have earwax that is overly thick or sticky.  Have eczema.  Are  dehydrated.  Have excess hair in the ear canal. What are the signs or symptoms? Symptoms of this condition include:  Reduced or muffled hearing.  A feeling of fullness in the ear or feeling that the ear is plugged.  Fluid coming from the ear.  Ear pain.  Ear itch.  Ringing in the ear.  Coughing.  An obvious piece of earwax that can be seen inside the ear canal. How is this diagnosed? This condition may be diagnosed based on:  Your symptoms.  Your medical history.  An ear exam. During the exam, your health care provider will look into your ear with an instrument called an otoscope. You may have tests, including a hearing test. How is this treated? This condition may be treated by:  Using ear drops to soften the earwax.  Having the earwax removed by a health care provider. The health care provider may: ? Flush the ear with water. ? Use an instrument that has a loop on the end (curette). ? Use a suction device.  Surgery to remove the wax buildup. This may be done in severe cases. Follow these instructions at home:   Take over-the-counter and prescription medicines only as told by your health care provider.  Do not put any objects, including cotton swabs, into your ear. You can clean the opening of your ear canal with a washcloth or facial tissue.  Follow instructions from your health care provider about  cleaning your ears. Do not over-clean your ears.  Drink enough fluid to keep your urine clear or pale yellow. This will help to thin the earwax.  Keep all follow-up visits as told by your health care provider. If earwax builds up in your ears often or if you use hearing aids, consider seeing your health care provider for routine, preventive ear cleanings. Ask your health care provider how often you should schedule your cleanings.  If you have hearing aids, clean them according to instructions from the manufacturer and your health care provider. Contact a health care  provider if:  You have ear pain.  You develop a fever.  You have blood, pus, or other fluid coming from your ear.  You have hearing loss.  You have ringing in your ears that does not go away.  Your symptoms do not improve with treatment.  You feel like the room is spinning (vertigo). Summary  Earwax can build up in the ear and cause discomfort or hearing loss.  The most common symptoms of this condition include reduced or muffled hearing and a feeling of fullness in the ear or feeling that the ear is plugged.  This condition may be diagnosed based on your symptoms, your medical history, and an ear exam.  This condition may be treated by using ear drops to soften the earwax or by having the earwax removed by a health care provider.  Do not put any objects, including cotton swabs, into your ear. You can clean the opening of your ear canal with a washcloth or facial tissue. This information is not intended to replace advice given to you by your health care provider. Make sure you discuss any questions you have with your health care provider. Document Released: 08/08/2004 Document Revised: 06/13/2017 Document Reviewed: 09/11/2016 Elsevier Patient Education  2020 [employer].

## 2019-06-03 NOTE — Progress Notes (Signed)
Subjective:  Patient ID: Cheyenne Sanders, female    DOB: 1950/12/30  Age: 68 y.o. MRN: [id number]  CC:  Chief Complaint  Patient presents with  . Ear Problem    Pt stated ---both ear are stopped up--1 week. Denied fever and pain.    HPI Cheyenne Sanders presents for    Both ears feel blocked for past week.  No pain. Qtips on outside.  No pain/fever/congestion/illness symptoms.   Tx: none.   History Patient Active Problem List   Diagnosis Date Noted  . Skin infection 11/25/2017  . Tick bite 11/25/2017  . Flank pain 09/06/2016  . Recurrent nephrolithiasis 08/27/2016  . Personal history of breast cancer 08/27/2016  . Coronary artery disease involving native coronary artery of native heart without angina pectoris 06/28/2015  . Essential hypertension 06/28/2015  . Mixed hyperlipidemia 06/28/2015  . Precordial chest pain 06/28/2015   Past Medical History:  Diagnosis Date  . Breast cancer ([place]) [id number]   R breast cancer x 2; lumpectomy with radiation, chemotherapy; mastectomy R.  . Nephrolithiasis   . Personal history of chemotherapy   . Personal history of radiation therapy    Past Surgical History:  Procedure Laterality Date  . ABDOMINAL HYSTERECTOMY  1986   DUB; ovaries intact.  Marland Kitchen BREAST BIOPSY Left 2016  . BREAST SURGERY Right 2014  . cardiac stenting  07/15/1989  . MASTECTOMY Right    2014   Allergies  Allergen Reactions  . Fentanyl Shortness Of Breath   Prior to Admission medications   Medication Sig Start Date End Date Taking? Authorizing Provider  aspirin 325 MG tablet Take 325 mg by mouth as needed.   Yes [provider]  NONFORMULARY OR COMPOUNDED [facility]:  Onychomycosis Nail Lacquer - Fluconazole 2%, Terbinafine 1%, DMSO, apply to affected area daily. 01/07/18  Yes Hyatt, Max T, DPM   Social History   Socioeconomic History  . Marital status: Widowed    Spouse name: Not on file  . Number of children: 2  . Years of  education: Not on file  . Highest education level: Not on file  Occupational History  . Not on file  Social Needs  . Financial resource strain: Not on file  . Food insecurity    Worry: Not on file    Inability: Not on file  . Transportation needs    Medical: Not on file    Non-medical: Not on file  Tobacco Use  . Smoking status: Never Smoker  . Smokeless tobacco: Never Used  Substance and Sexual Activity  . Alcohol use: Yes    Alcohol/week: 1.0 standard drinks    Types: 1 Glasses of wine per week  . Drug use: No  . Sexual activity: Not Currently  Lifestyle  . Physical activity    Days per week: Not on file    Minutes per session: Not on file  . Stress: Not on file  Relationships  . Social Herbalist on phone: Not on file    Gets together: Not on file    Attends religious service: Not on file    Active member of club or organization: Not on file    Attends meetings of clubs or organizations: Not on file    Relationship status: Not on file  . Intimate partner violence    Fear of current or ex partner: Not on file    Emotionally abused: Not on file    Physically abused: Not on file  Forced sexual activity: Not on file  Other Topics Concern  . 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      Tobacco: none      Alcohol: rare; social      Exercise: no formal exercise.        ADLs: independent with ADLs    Review of Systems Per HPI.   Objective:   Vitals:   06/03/19 1609  BP: 131/76  Pulse: 65  Resp: 12  Temp: 98.5 F (36.9 C)  SpO2: 99%  Weight: 127 lb (57.6 kg)  Height: 5\' 5"  (1.651 m)     Physical Exam Constitutional:      General: She is not in acute distress.    Appearance: She is well-developed.   HENT:     Head: Normocephalic and atraumatic.     Right Ear: External ear normal. There is impacted cerumen.     Left Ear: External ear normal. There is impacted cerumen (dark yellow cerumen obstructing canal bilaterally. ).  Cardiovascular:     Rate and Rhythm: Normal rate.  Pulmonary:     Effort: Pulmonary effort is normal.  Neurological:     Mental Status: She is alert and oriented to person, place, and time.     After cerumen lavage, left canal slightly irritated, appears to have some possible slight abrasion on the inferior aspect, irritated canal without true foreign body.  TM intact.  Minimal irritation of right canal, TM intact.  Hearing has improved bilaterally.   Assessment & Plan:  Cheyenne Sanders is a 68 y.o. female . Bilateral impacted cerumen - Plan: Ear wax removal  -Improved after lavage.  There was some irritation noted on canals as above after lavage, reportedly did have a small piece of cotton tip swab that was removed.  Possible abrasion versus irritation from lavage.  No perforation or wounds seen.  Cortisporin otic prescribed if continued irritation/discomfort in the next 24 to 48 hours, RTC precautions if worsening.  No orders of the defined types were placed in this encounter.  Patient Instructions       If you have lab work done today you will be contacted with your lab results within the next 2 weeks.  If you have not heard from [place] then please contact us. The fastest way to get your results is to register for My Chart.   IF you received an x-ray today, you will receive an invoice from [facility]. Please contact [facility] at [phone] with questions or concerns regarding your invoice.   IF you received labwork today, you will receive an invoice from [place]. Please contact [facility] at [phone] with questions or concerns regarding your invoice.   Our billing staff will not be able to assist you with questions regarding bills  from these companies.  You will be contacted with the lab results as soon as they are available. The fastest way to get your results is to activate your My Chart account. Instructions are located on the last page of this paperwork. If you have not heard from [place] regarding the results in 2 weeks, please contact this office.          Signed, [name],  MD [facility]

## 2019-07-14 ENCOUNTER — Ambulatory Visit (INDEPENDENT_AMBULATORY_CARE_PROVIDER_SITE_OTHER): Payer: Medicare Other | Admitting: Podiatry

## 2019-07-14 ENCOUNTER — Encounter: Payer: Self-pay | Admitting: Podiatry

## 2019-07-14 ENCOUNTER — Other Ambulatory Visit: Payer: Self-pay

## 2019-07-14 DIAGNOSIS — L603 Nail dystrophy: Secondary | ICD-10-CM

## 2019-07-14 NOTE — Progress Notes (Signed)
She presents today for follow-up of nail fungus states that I have tried using topical antifungal's.  I have tried laser therapy.  And he just does not seem to be improving as she refers to the hallux left.  Objective: Vital signs are stable alert and oriented x3.  Pulses are palpable.  At this point nail plate does demonstrate what appears to be a nail dystrophy or onychomycosis.  Plan: I took samples of the nail today and we are going to hold these until she can determine as to whether or not she has an old nail biopsy.  She seems to think that there was one performed but I cannot find 1 in our charts.  If she cannot find it we will send it for pathologic evaluation at which time and less it is T rubrum we will need to do an oral therapy.

## 2019-07-26 ENCOUNTER — Other Ambulatory Visit: Payer: Self-pay

## 2019-07-26 DIAGNOSIS — L603 Nail dystrophy: Secondary | ICD-10-CM

## 2019-07-26 DIAGNOSIS — B351 Tinea unguium: Secondary | ICD-10-CM | POA: Diagnosis not present

## 2019-07-26 NOTE — Progress Notes (Signed)
Per Dr. Milinda Pointer, he has reviewed old culture results and request to send in new nail culture to [facility].   Specimin has been sent.

## 2019-08-03 ENCOUNTER — Ambulatory Visit (INDEPENDENT_AMBULATORY_CARE_PROVIDER_SITE_OTHER): Payer: Medicare Other | Admitting: Cardiovascular Disease

## 2019-08-03 ENCOUNTER — Other Ambulatory Visit: Payer: Self-pay

## 2019-08-03 VITALS — BP 144/85 | HR 73 | Temp 96.8°F | Ht 65.0 in | Wt 127.0 lb

## 2019-08-03 DIAGNOSIS — I340 Nonrheumatic mitral (valve) insufficiency: Secondary | ICD-10-CM

## 2019-08-03 DIAGNOSIS — E7800 Pure hypercholesterolemia, unspecified: Secondary | ICD-10-CM

## 2019-08-03 DIAGNOSIS — Z79899 Other long term (current) drug therapy: Secondary | ICD-10-CM

## 2019-08-03 DIAGNOSIS — R0789 Other chest pain: Secondary | ICD-10-CM

## 2019-08-03 DIAGNOSIS — R0602 Shortness of breath: Secondary | ICD-10-CM | POA: Diagnosis not present

## 2019-08-03 MED ORDER — ROSUVASTATIN CALCIUM 20 MG PO TABS: 20.0000 mg | ORAL_TABLET | Freq: Every day | ORAL | 6 refills | Status: DC

## 2019-08-03 MED ORDER — METOPROLOL TARTRATE 25 MG PO TABS: 25.0000 mg | ORAL_TABLET | ORAL | 3 refills | Status: DC | PRN

## 2019-08-03 NOTE — Progress Notes (Signed)
Cardiology Office Note    Date:  08/03/2019   ID:  Cheyenne Sanders, DOB 06-21-1951, MRN [id number]  PCP:  Rutherford Guys, MD  Cardiologist:  Shelva Majestic, MD   F/U cardiology evaluation initially referred through the courtesy of Dr. Grant Fontana for evaluation palpitations and atypical chest pain  History of Present Illness:  Cheyenne Sanders is a 69 y.o. female who was referred through the courtesy of Dr. Pamella Pert  with atypical chest pain and intermittent palpitations.  I saw her for initial evaluation on 06/17/2017 and last saw her in February 2019.  She presents for 21-monthfollow-up evaluation.  [name] any known cardiac history.  [place] had developed an episode of atypical chest pain and underwent a routine treadmill test.  She was told that this was normal.  [place]   She had noticed some episodes of palpitations which were short-lived.  She also notes a sensation of chest fluttering intermittently and nonexertional chest sensation which often would last 3-4 minutes.  [facility] pain which radiated to her year and down her left arm without associated chest pain.  She denied associated shortness of breath, diaphoresis or nausea.  She was hypertensive.  Her ECG was unremarkable.    When I initially saw her, I recommended she undergo an echo Doppler study which was done on 07/22/2017.  This showed normal systolic function.  Doppler parameters were consistent with high ventricular filling pressure.  There was mild AR.  There was mild focal calcification of the anterior mitral valve leaflet with mild MR and mild PR.  When I saw her, her blood pressure was elevated and I recommended a trial of low-dose metoprolol succinate 25 mg daily.  Apparently, she had a prescription  filled, but then never initiated treatment.  When she checked her blood pressure at home and was in the low-normal range and ultimately never started this.  Her symptoms have completely resolved.   I saw her in follow-up on September 08, 2017 at which time she denied any recurrent palpitations or chest pain.  She had undergone laboratory by her primary physician in December 2018 which showed a total cholesterol 217, LDL 148, triglycerides 77, HDL 54.  She was advised to change her diet with plans for subsequent follow-up evaluation.  I recommended follow-up evaluation with her primary MD and if LDL remains elevated initiate treatment.  Since I last saw her, she has felt well without chest pain.  She has noticed some episodes of "chest quivering."   She does experience occasional episodes of shortness of breath.  She also has noticed a vague atypical chest discomfort after eating.  She denies any exertional precipitation.  She had undergone repeat laboratory by her primary physician in August 2020 which continued to show elevation of lipid studies with total cholesterol 247, and LDL cholesterol 166.  TSH was 2.47.  She had normal renal function.  She presents for evaluation.   Past Medical History:  Diagnosis Date  . [place] [id number]  R breast cancer x 2; lumpectomy with radiation, chemotherapy; mastectomy R.  . Nephrolithiasis   . Personal history of chemotherapy   . Personal history of radiation therapy     Past Surgical History:  Procedure Laterality Date  . ABDOMINAL HYSTERECTOMY  1986   DUB; ovaries intact.  Marland Kitchen BREAST BIOPSY Left 2016  . BREAST SURGERY Right 2014  . cardiac stenting  07/15/1989  . MASTECTOMY Right    2014    Current Medications: Outpatient Medications Prior to Visit  Medication Sig Dispense Refill  . Cholecalciferol (D3-1000) 25 MCG (1000 UT) capsule Take 1,000 Units by mouth daily.    . Multiple Vitamins-Minerals (PRESERVISION AREDS 2 PO) Take by  mouth.    Marland Kitchen aspirin 325 MG tablet Take 325 mg by mouth as needed.    . NONFORMULARY OR COMPOUNDED ITEM [facility]:  Onychomycosis Nail Lacquer - Fluconazole 2%, Terbinafine 1%, DMSO, apply to affected area daily. 120 each 11  . neomycin-polymyxin-hydrocortisone (CORTISPORIN) OTIC solution Place 3 drops into both ears 4 (four) times daily. To affected ear if needed for 1 week. 10 mL 0   No facility-administered medications prior to visit.     Allergies:   Fentanyl   Social History   Socioeconomic History  . Marital status: Widowed    Spouse name: Not on file  . Number of children: 2  . Years of education: Not on file  . Highest education level: Not on file  Occupational History  . Not on file  Tobacco Use  . Smoking status: Never Smoker  . Smokeless tobacco: Never Used  Substance and Sexual Activity  . Alcohol use: Yes    Alcohol/week: 1.0 standard drinks    Types: 1 Glasses of wine per week  . Drug use: No  . Sexual activity: Not Currently  Other Topics Concern  . 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      Tobacco: none      Alcohol: rare; social      Exercise: no formal exercise.        ADLs: independent with ADLs   Social Determinants of Health   Financial Resource Strain:   . Difficulty of Paying Living Expenses: Not on file  Food Insecurity:   . Worried About Charity fundraiser in the Last Year: Not on file  . Ran Out of Food in the Last Year: Not on file  Transportation Needs:   . Lack of Transportation (Medical): Not on file  . Lack of Transportation (Non-Medical): Not on file  Physical Activity:   . Days of Exercise per Week: Not on file  . Minutes of Exercise per Session: Not on file  Stress:   . Feeling of  Stress : Not on file  Social Connections:   . Frequency of Communication with Friends and Family: Not on file  . Frequency of Social Gatherings with Friends and Family: Not on file  . Attends Religious Services: Not on file  . Active Member of Clubs or Organizations: Not on file  . Attends Archivist Meetings: Not on file  . Marital Status: Not on file    She is widowed for 5 years.  She lives by herself in [place].  Previously she had worked as a Oceanographer.  There is no tobacco history.  She rarely drinks alcohol.  She does not routinely exercise.  Family History:  [name] family history includes Cancer in her father; Heart  disease in her mother.  Her mother had a history of atrial fibrillation and died at age 83.  Father died at age 64 with kidney failure.  There are no siblings.  She has 2 children.  She lives by herself in [place].  ROS General: Negative; No fevers, chills, or night sweats;  HEENT: Negative; No changes in vision or hearing, sinus congestion, difficulty swallowing Pulmonary: Negative; No cough, wheezing, shortness of breath, hemoptysis Cardiovascular: see HPI GI: Negative; No nausea, vomiting, diarrhea, or abdominal pain GU: Negative; No dysuria, hematuria, or difficulty voiding Musculoskeletal: Negative; no myalgias, joint pain, or weakness Hematologic/Oncology: Negative; no easy bruising, bleeding Endocrine: Negative; no heat/cold intolerance; no diabetes Neuro: Negative; no changes in balance, headaches Skin: Negative; No rashes or skin lesions Psychiatric: Negative; No behavioral problems, depression Sleep: Negative; No snoring, daytime sleepiness, hypersomnolence, bruxism, restless legs, hypnogognic hallucinations, no cataplexy Other comprehensive 14 point system review is negative.   PHYSICAL EXAM:   VS:  BP (!) 144/85   Pulse 73   Temp (!) 96.8 F (36 C)   Ht _0  (1.651 m)   Wt 127 lb (57.6 kg)   SpO2 100%   BMI 21.13  kg/m     Repeat blood pressure by me was 124/78  Wt Readings from Last 3 Encounters:  08/03/19 127 lb (57.6 kg)  06/03/19 127 lb (57.6 kg)  02/25/19 122 lb (55.3 kg)    General: Alert, oriented, no distress.  Skin: normal turgor, no rashes, warm and dry HEENT: Normocephalic, atraumatic. Pupils equal round and reactive to light; sclera anicteric; extraocular muscles intact;  Nose without nasal septal hypertrophy Mouth/Parynx benign; Mallinpatti scale 2 Neck: No JVD, no carotid bruits; normal carotid upstroke Lungs: clear to ausculatation and percussion; no wheezing or rales Chest wall: without tenderness to palpitation Heart: PMI not displaced, occasional ectopy with an apparent quadrigeminal rhythm, s1 s2 normal, 1/6 systolic murmur, no diastolic murmur, no rubs, gallops, thrills, or heaves Abdomen: soft, nontender; no hepatosplenomehaly, BS+; abdominal aorta nontender and not dilated by palpation. Back: no CVA tenderness Pulses 2+ Musculoskeletal: full range of motion, normal strength, no joint deformities Extremities: no clubbing cyanosis or edema, Homan's sign negative  Neurologic: grossly nonfocal; Cranial nerves grossly wnl Psychologic: Normal mood and affect   Studies/Labs Reviewed:   EKG:  EKG is ordered today. ECG (independently read by me): Sinus rhythm at 73 bpm with an occasional PVC and a quadrigeminal pattern  February 2019 ECG (independently read by me): Normal sinus rhythm at 74 bpm.  Possible left atrial enlargement.  Normal intervals.  No ectopy.  December 2018 ECG (independently read by me): Sinus rhythm at 72 bpm with occasional PVC.  Nonspecific ST changes.  QTc interval 468 ms.  PR interval 132 ms.  Recent Labs: BMP Latest Ref Rng & Units 02/25/2019 05/27/2018 06/17/2017  Glucose 65 - 99 mg/dL 78 74 75  BUN 8 - 27 mg/dL _1 Creatinine 0.57 - 1.00 mg/dL 0.71 0.60 0.60  BUN/Creat Ratio 12 - _2 38(H)  Sodium 134 - 144 mmol/L 142 139 145(H)    Potassium 3.5 - 5.2 mmol/L 4.4 4.5 4.2  Chloride 96 - 106 mmol/L 103 99 106  CO2 20 - 29 mmol/L _3 Calcium 8.7 - 10.3 mg/dL 10.1 9.4 9.2     Hepatic Function Latest Ref Rng & Units 02/25/2019 05/27/2018 06/17/2017  Total Protein 6.0 - 8.5 g/dL 6.9 6.7 6.4  Albumin 3.8 - 4.8 g/dL 4.8 4.5  4.4  AST 0 - 40 IU/L 32 29 25  ALT 0 - 32 IU/L 30 50(H) 25  Alk Phosphatase 39 - 117 IU/L 63 77 67  Total Bilirubin 0.0 - 1.2 mg/dL 0.5 0.6 0.4  Bilirubin, Direct 0.00 - 0.40 mg/dL - 0.13 -    CBC Latest Ref Rng & Units 05/27/2018 06/17/2017 08/27/2016  WBC 3.4 - 10.8 x10E3/uL 6.8 7.7 7.4  Hemoglobin 11.1 - 15.9 g/dL 14.3 13.6 14.2  Hematocrit 34.0 - 46.6 % 42.3 40.2 47.1(H)  Platelets 150 - 450 x10E3/uL 239 247 259   Lab Results  Component Value Date   MCV 92 05/27/2018   MCV 91 06/17/2017   MCV 102 (H) 08/27/2016   Lab Results  Component Value Date   TSH 2.470 02/25/2019   Lab Results  Component Value Date   HGBA1C 5.5 06/17/2017     BNP No results found for: BNP  ProBNP No results found for: PROBNP   Lipid Panel     Component Value Date/Time   CHOL 247 (H) 02/25/2019 1646   TRIG 95 02/25/2019 1646   HDL 62 02/25/2019 1646   CHOLHDL 4.0 02/25/2019 1646   LDLCALC 166 (H) 02/25/2019 1646     RADIOLOGY: No results found.   Additional studies/ records that were reviewed today include:  Reviewed the records from [place]  I reviewed records of Dr. Jacelyn Pi   ASSESSMENT:    No diagnosis found.   PLAN:  Ms Shreffler is a very pleasant 69 year-old widowed female who had a history of remote atypical chest pain and underwent a routine treadmill test in 2016 while living in [place].  This apparently was normal.  When I saw her in 2019 she had noticed  left jaw discomfort radiating to her ear and down her left arm.   She denies any history of exertionally precipitated episodes of chest tightness and admitted to an occasional episode of palpitations.  I  recommended initiation of low-dose beta-blocker at that time.  Her symptoms completely resolved and she stopped taking her medication.  Recently, she has noticed some sensations of chest quivering as well as episodes of shortness of breath. Her ECG today shows sinus rhythm with occasional PVCs in a quadrigeminal pattern.  I am recommending an echo Doppler study for reassessment of LV systolic and diastolic function as well as previously documented mitral regurgitation.  I again am recommending institution of metoprolol succinate 25 mg.  With her progression in LDL elevation I have strongly recommended initiation of statin therapy and will give her a prescription for 20 mg but to initially take 10 mg for 2 weeks and then if tolerated to titrate this to 20 mg daily.  In 3 months she will undergo comprehensive metabolic panel, lipid panel, TSH, and CBC and I will see her for a follow-up evaluation.  Medication Adjustments/Labs and Tests Ordered: Current medicines are reviewed at length with the patient today.  Concerns regarding medicines are outlined above.  Medication changes, Labs and Tests ordered today are listed in the Patient Instructions below. There are no Patient Instructions on file for this visit.   Signed, Shelva Majestic, MD  08/03/2019 4:39 PM    [facility] [address], [place], [place], [place]  [address] Phone: [phone]

## 2019-08-03 NOTE — Patient Instructions (Signed)
Medication Instructions:  START- Metoprolol 25 mg by mouth as needed for palpitations START- Crestor 20 mg take 10 mg(1/2 tablet) for 2 weeks then increase to 20 mg daily if tolerated.  *If you need a refill on your cardiac medications before your next appointment, please call your pharmacy*  Lab Work: CBC, CMP, TSH, and Fasting Lipids in 3 Months  If you have labs (blood work) drawn today and your tests are completely normal, you will receive your results only by: Marland Kitchen MyChart Message (if you have MyChart) OR . A paper copy in the mail If you have any lab test that is abnormal or we need to change your treatment, we will call you to review the results.  Testing/Procedures: Your physician has requested that you have an echocardiogram. Echocardiography is a painless test that uses sound waves to create images of your heart. It provides your doctor with information about the size and shape of your heart and how well your heart's chambers and valves are working. This procedure takes approximately one hour. There are no restrictions for this procedure.  Follow-Up: At [facility], you and your health needs are our priority.  As part of our continuing mission to provide you with exceptional heart care, we have created designated Provider Care Teams.  These Care Teams include your primary Cardiologist (physician) and Advanced Practice Providers (APPs -  Physician Assistants and Nurse Practitioners) who all work together to provide you with the care you need, when you need it.  Your next appointment:   3 month(s)  The format for your next appointment:   In Person  Provider:   Shelva Majestic, MD

## 2019-08-06 ENCOUNTER — Ambulatory Visit (HOSPITAL_COMMUNITY): Payer: Medicare Other | Attending: Cardiology

## 2019-08-06 ENCOUNTER — Other Ambulatory Visit: Payer: Self-pay

## 2019-08-06 DIAGNOSIS — R0789 Other chest pain: Secondary | ICD-10-CM | POA: Insufficient documentation

## 2019-08-06 DIAGNOSIS — R0602 Shortness of breath: Secondary | ICD-10-CM | POA: Diagnosis not present

## 2019-08-08 ENCOUNTER — Encounter: Payer: Self-pay | Admitting: Cardiovascular Disease

## 2019-08-09 ENCOUNTER — Ambulatory Visit: Payer: BLUE CROSS/BLUE SHIELD | Admitting: Cardiovascular Disease

## 2019-08-12 ENCOUNTER — Other Ambulatory Visit (HOSPITAL_COMMUNITY): Payer: Medicare Other

## 2019-08-12 ENCOUNTER — Telehealth: Payer: Self-pay

## 2019-08-12 NOTE — Telephone Encounter (Signed)
Patient has been notified of results and will call back to schedule follow up appointment with Dr. Milinda Pointer

## 2019-08-12 NOTE — Telephone Encounter (Signed)
-----   Message from Garrel Ridgel, [place] sent at 08/10/2019 11:29 AM EST ----- Positive for fungus and will follow up with her at her next scheduled appointment.

## 2019-08-17 ENCOUNTER — Other Ambulatory Visit: Payer: Self-pay

## 2019-08-17 ENCOUNTER — Telehealth (INDEPENDENT_AMBULATORY_CARE_PROVIDER_SITE_OTHER): Payer: Medicare Other | Admitting: Family Medicine

## 2019-08-17 DIAGNOSIS — M25561 Pain in right knee: Secondary | ICD-10-CM

## 2019-08-17 DIAGNOSIS — E782 Mixed hyperlipidemia: Secondary | ICD-10-CM | POA: Diagnosis not present

## 2019-08-17 NOTE — Progress Notes (Signed)
Virtual Visit Note  I connected with patient on 08/17/19 at 356pm by phone and verified that I am speaking with the correct person using two identifiers. Cheyenne Sanders is currently located at home and patient is currently with them during visit. The provider, Rutherford Guys, MD is located in their office at time of visit.  I discussed the limitations, risks, security and privacy concerns of performing an evaluation and management service by telephone and the availability of in person appointments. I also discussed with the patient that there may be a patient responsible charge related to this service. The patient expressed understanding and agreed to proceed.   CC: leg pain  HPI ? Having pain in Right bend of knee, goes up into her thigh Having cramps in her thighs, hamstring Pain is mild, not even enough to take APAP Does not feel much different than the left leg Denies any redness, warmth or tenderness to touch Worried about the blood clot Has been trying to drink more water/fluid in general  Denies any recent changes in activity  Saw cards recently He started her crestor which she has not started yet  Allergies  Allergen Reactions  . Fentanyl Shortness Of Breath    Prior to Admission medications   Medication Sig Start Date End Date Taking? Authorizing Provider  aspirin 325 MG tablet Take 325 mg by mouth as needed.    [provider]  Cholecalciferol (D3-1000) 25 MCG (1000 UT) capsule Take 1,000 Units by mouth daily.    [provider]  metoprolol tartrate (LOPRESSOR) 25 MG tablet Take 1 tablet (25 mg total) by mouth as needed. 08/03/19 11/01/19  Troy Sine, MD  Multiple Vitamins-Minerals (PRESERVISION AREDS 2 PO) Take by mouth.    [provider]  NONFORMULARY OR COMPOUNDED [place]:  Onychomycosis Nail Lacquer - Fluconazole 2%, Terbinafine 1%, DMSO, apply to affected area daily. 01/07/18   Hyatt, Max T, DPM  rosuvastatin  (CRESTOR) 20 MG tablet Take 1 tablet (20 mg total) by mouth daily. 08/03/19 11/01/19  Troy Sine, MD    Past Medical History:  Diagnosis Date  . Breast cancer ([place]) [id number]   R breast cancer x 2; lumpectomy with radiation, chemotherapy; mastectomy R.  . Nephrolithiasis   . Personal history of chemotherapy   . Personal history of radiation therapy     Past Surgical History:  Procedure Laterality Date  . ABDOMINAL HYSTERECTOMY  1986   DUB; ovaries intact.  Marland Kitchen BREAST BIOPSY Left 2016  . BREAST SURGERY Right 2014  . cardiac stenting  07/15/1989  . MASTECTOMY Right    2014    Social History   Tobacco Use  . Smoking status: Never Smoker  . Smokeless tobacco: Never Used  Substance Use Topics  . Alcohol use: Yes    Alcohol/week: 1.0 standard drinks    Types: 1 Glasses of wine per week    Family History  Problem Relation Age of Onset  . Heart disease Mother   . Cancer Father   . Breast cancer Neg Hx     ROS Per hpi  Objective  Vitals as reported by the patient: none   ASSESSMENT and PLAN  1. Arthralgia of right lower leg Discussed with patient limited evaluation, history does not sound concerning for DVT, discussed supportive measures and RTC precautions.  2. Mixed hyperlipidemia Discussed concerns re statins. Advised follow cards recommendation, start crestor 10mg   FOLLOW-UP: prn   The above assessment and management plan was  discussed with the patient. The patient verbalized understanding of and has agreed to the management plan. Patient is aware to call the clinic if symptoms persist or worsen. Patient is aware when to return to the clinic for a follow-up visit. Patient educated on when it is appropriate to go to the emergency department.    I provided 18 minutes of non-face-to-face time during this encounter.  Rutherford Guys, MD Primary Care at [facility] [place], [place] [address] Ph.  [phone] Fax [phone]

## 2019-08-17 NOTE — Progress Notes (Signed)
Having pain in the legs, not really a major paine so no meds are being used for the pain. There is a concern due to mother in law having clot in the leg. She also wants to talk about the statin drug cardio doctor is suggesting she should be on. Pharmacy and medications verified.

## 2019-08-19 ENCOUNTER — Other Ambulatory Visit: Payer: Self-pay

## 2019-08-19 ENCOUNTER — Ambulatory Visit (INDEPENDENT_AMBULATORY_CARE_PROVIDER_SITE_OTHER): Payer: Medicare Other | Admitting: Podiatry

## 2019-08-19 ENCOUNTER — Ambulatory Visit: Payer: Medicare Other | Admitting: Podiatry

## 2019-08-19 DIAGNOSIS — H04123 Dry eye syndrome of bilateral lacrimal glands: Secondary | ICD-10-CM | POA: Diagnosis not present

## 2019-08-19 DIAGNOSIS — H25093 Other age-related incipient cataract, bilateral: Secondary | ICD-10-CM | POA: Diagnosis not present

## 2019-08-19 DIAGNOSIS — Z79899 Other long term (current) drug therapy: Secondary | ICD-10-CM

## 2019-08-19 DIAGNOSIS — L603 Nail dystrophy: Secondary | ICD-10-CM

## 2019-08-21 NOTE — Progress Notes (Signed)
She presents today for follow-up of her pathology results regarding her toenails.  Objective: Vital signs are stable she alert and oriented x3 pathology report does demonstrate saprophytic fungus.  Assessment: Saprophytic onychomycosis.  Plan: At this point laser therapy or oral therapy would be indicated she would like to go ahead and start on laser therapy.  We will set that appointment up she will follow up with Caryl Pina in the near future for laser.

## 2019-08-26 ENCOUNTER — Ambulatory Visit: Payer: Medicare Other | Admitting: Podiatry

## 2019-08-30 ENCOUNTER — Ambulatory Visit (INDEPENDENT_AMBULATORY_CARE_PROVIDER_SITE_OTHER): Payer: Medicare Other | Admitting: Medical

## 2019-08-30 ENCOUNTER — Encounter: Payer: Self-pay | Admitting: Medical

## 2019-08-30 ENCOUNTER — Telehealth: Payer: Self-pay | Admitting: Physician Assistant

## 2019-08-30 ENCOUNTER — Other Ambulatory Visit: Payer: Self-pay

## 2019-08-30 VITALS — BP 162/82 | HR 73 | Ht 65.0 in | Wt 125.0 lb

## 2019-08-30 DIAGNOSIS — R002 Palpitations: Secondary | ICD-10-CM

## 2019-08-30 DIAGNOSIS — Z01810 Encounter for preprocedural cardiovascular examination: Secondary | ICD-10-CM | POA: Diagnosis not present

## 2019-08-30 DIAGNOSIS — R072 Precordial pain: Secondary | ICD-10-CM | POA: Diagnosis not present

## 2019-08-30 DIAGNOSIS — E7800 Pure hypercholesterolemia, unspecified: Secondary | ICD-10-CM

## 2019-08-30 DIAGNOSIS — I10 Essential (primary) hypertension: Secondary | ICD-10-CM | POA: Diagnosis not present

## 2019-08-30 DIAGNOSIS — R0600 Dyspnea, unspecified: Secondary | ICD-10-CM | POA: Diagnosis not present

## 2019-08-30 DIAGNOSIS — R0609 Other forms of dyspnea: Secondary | ICD-10-CM

## 2019-08-30 MED ORDER — PANTOPRAZOLE SODIUM 40 MG PO TBEC: 40.0000 mg | DELAYED_RELEASE_TABLET | Freq: Every day | ORAL | 11 refills | Status: DC

## 2019-08-30 NOTE — Telephone Encounter (Signed)
Generic message left for patient to inform her of 3:15pm appointment with Roby Lofts. Apppointment made at 3:15pm.

## 2019-08-30 NOTE — Telephone Encounter (Signed)
Pt called reporting pain under her left breast for two days. The pain is intermittent. She took 325 mg ASA yesterday, which helped. The pain was worse when she rolled over last night. She was able to sleep last night, but chest pain woke her from sleep this morning at 5AM. She wants to know if she should take PRN lopressor. I advised taking ASA 81 mg and lopressor this morning. Systolic pressure is [id number]. I advised that she should be evaluated in the ER for ongoing chest pain. She prefers to wait and be seen in the office this morning. I also advised that she could be seen in her PCP office for an evaluation and EKG, if they can see her sooner than [facility]. I told her if the pain gets worse, she needs to come to the ER.

## 2019-08-30 NOTE — Progress Notes (Signed)
Cardiology Office Note   Date:  08/30/2019   ID:  Magon, Boulton 09/26/1950, MRN [id number]  PCP:  Rutherford Guys, MD  Cardiologist:  Shelva Majestic, MD EP: None  Chief Complaint  Patient presents with  . Chest Pain      History of Present Illness: Cheyenne Sanders is a 69 y.o. female with a PMH of atypical chest pain, palpitations, HLD, and breast cancer s/p radiation/chemotherapy/mastectomy, who presents for evaluation of chest pain.   She was last evaluated by cardiology at an outpatient visit with Dr. Claiborne Billings 08/03/19, at which time she had occasional palpitations (improved since starting BBlocker), occasional SOB, as well as post-prandial chest discomfort. She was recommended for an echocardiogram which showed EF 60-65%, indeterminate LV diastolic function, no RWMA, mild MR, and mild AI. She was recommended to start rosuvastatin for risk factor modifications given LDL 166 02/2019. Her last ischemic evaluation was reportedly a NST in 2016 which was negative.   She called the after hours line this morning with complaints of intermittent chest pain over the past 2 days which woke her from sleep this morning. She was advised to present to the ED for evaluation, however preferred an outpatient visit, for which this appointment was scheduled.   She presents for the evaluation of chest pain for the past 2 days. She reports a sharp pain which occurs without rhyme or reason. Does not seem to have an exertional component. She has been awoken from sleep with the pain on a couple occasions, notably this morning when she had 8/10 pain. She denies associated diaphoresis, SOB, dizziness, lightheadedness, or syncope. She has not noticed any palpitations or racing heart beats. Her blood pressure has been elevated today with SBP in the 150s-160s, though she does not carry the diagnosis of HTN. She does not feel that her chest pain is related to po intake. She occasionally notes worsening of  chest pain with changes in position, namely leaning forward. She has not had any recent changes in activity or new exercise regimen. She denies pain to touch. She reports some pain to her posterior knee 1 week ago, initially concerned for DVT, though talked with PCP who did not feel further testing was required given no swelling or redness. She has chronic DOE which is unchanged. At the time of this evaluation she is reporting 2/10 chest discomfort.      Past Medical History:  Diagnosis Date  . Breast cancer ([place]) [id number]   R breast cancer x 2; lumpectomy with radiation, chemotherapy; mastectomy R.  . Nephrolithiasis   . Personal history of chemotherapy   . Personal history of radiation therapy     Past Surgical History:  Procedure Laterality Date  . ABDOMINAL HYSTERECTOMY  1986   DUB; ovaries intact.  Marland Kitchen BREAST BIOPSY Left 2016  . BREAST SURGERY Right 2014  . cardiac stenting  07/15/1989  . MASTECTOMY Right    2014     Current Outpatient Medications  Medication Sig Dispense Refill  . aspirin 325 MG tablet Take 325 mg by mouth as needed.    . Cholecalciferol (D3-1000) 25 MCG (1000 UT) capsule Take 1,000 Units by mouth daily.    . metoprolol tartrate (LOPRESSOR) 25 MG tablet Take 1 tablet (25 mg total) by mouth as needed. 30 tablet 3  . Multiple Vitamins-Minerals (PRESERVISION AREDS 2 PO) Take by mouth.    . NONFORMULARY OR COMPOUNDED ITEM [facility]:  Onychomycosis Nail Lacquer - Fluconazole 2%, Terbinafine 1%,  DMSO, apply to affected area daily. 120 each 11  . rosuvastatin (CRESTOR) 20 MG tablet Take 1 tablet (20 mg total) by mouth daily. 30 tablet 6  . pantoprazole (PROTONIX) 40 MG tablet Take 1 tablet (40 mg total) by mouth daily. 30 tablet 11   No current facility-administered medications for this visit.    Allergies:   Fentanyl    Social History:  The patient  reports that she has never smoked. She has never used smokeless tobacco. She reports current alcohol use  of about 1.0 standard drinks of alcohol per week. She reports that she does not use drugs.   Family History:  [name] family history includes Cancer in her father; Heart disease in her mother.    ROS:  Please see the history of present illness.   Otherwise, review of systems are positive for none.   All other systems are reviewed and negative.    PHYSICAL EXAM: VS:  BP (!) 162/82   Pulse 73   Ht 5\' 5"  (1.651 m)   Wt 125 lb (56.7 kg)   BMI 20.80 kg/m  , BMI Body mass index is 20.8 kg/m. GEN: Well nourished, well developed, in no acute distress HEENT: sclera anicteric Neck: no JVD, carotid bruits, or masses Cardiac: RRR; no murmurs, rubs, or gallops, no edema; no chest wall TTP Respiratory:  clear to auscultation bilaterally, normal work of breathing GI: soft, nontender, nondistended, + BS MS: no deformity or atrophy; no calf pain, no chest pain with arm movement.  Skin: warm and dry, no rash Neuro:  Strength and sensation are intact Psych: euthymic mood, full affect   EKG:  EKG is ordered today. The ekg ordered today demonstrates sinus rhythm with rate 73 bpm, 2 PVCs, no STE/D, no TWI; QTc 469   Recent Labs: 02/25/2019: ALT 30; BUN 19; Creatinine, Ser 0.71; Potassium 4.4; Sodium 142; TSH 2.470    Lipid Panel    Component Value Date/Time   CHOL 247 (H) 02/25/2019 1646   TRIG 95 02/25/2019 1646   HDL 62 02/25/2019 1646   CHOLHDL 4.0 02/25/2019 1646   LDLCALC 166 (H) 02/25/2019 1646      Wt Readings from Last 3 Encounters:  08/30/19 125 lb (56.7 kg)  08/03/19 127 lb (57.6 kg)  06/03/19 127 lb (57.6 kg)      Other studies Reviewed: Additional studies/ records that were reviewed today include:   NST 08/06/19: 1. Left ventricular ejection fraction, by visual estimation, is 60 to  65%. The left ventricle has normal function. There is no left ventricular  hypertrophy.  2. Left ventricular diastolic parameters are indeterminate.  3. The left ventricle has no  regional wall motion abnormalities.  4. Global right ventricle has normal systolic function.The right  ventricular size is normal.  5. Left atrial size was normal.  6. Right atrial size was normal.  7. Mild mitral annular calcification.  8. The mitral valve is normal in structure. Mild mitral valve  regurgitation. No evidence of mitral stenosis.  9. The tricuspid valve is normal in structure. Tricuspid valve  regurgitation is mild.  10. The aortic valve is tricuspid. Aortic valve regurgitation is mild. No  evidence of aortic valve sclerosis or stenosis.  11. The pulmonic valve was not well visualized. Pulmonic valve  regurgitation is trivial.  12. The inferior vena cava is normal in size with greater than 50%  respiratory variability, suggesting right atrial pressure of 3 mmHg.  13. Normal LV systolic function; mild AI,  MR and TR.    ASSESSMENT AND PLAN:  1. Chest pain: atypical symptoms. No clear exertional component. Does worsen with certain positions but not tender to palpation. BP is elevated today which could be contributing. Also possible GI component. She had complaints of atypical chest pain at her visit with Dr. Claiborne Billings 08/03/19. Dr. Claiborne Billings in office today to discuss plan - in agreement with below. - Will check a Coronary CTA to evaluate coronary artery anatomy given recurrent chest pain - Will trial pantoprazole 40mg  daily for possible GI component - We discussed ED precautions should chest pain worsen  2. HTN: BP has been elevated above 130/80 for the past 2 visits. She has had SBP in the 150s-160s today on multiple checks.  - Will start metoprolol succinate 25mg  daily with plans to uptitrate vs add amlodipine pending response.   3. HLD: LDL 166 02/2019. Recommended to start crestor 10mg  daily x2 weeks then uptitrate to 20mg  daily, though she has not started this medication yet - Encouraged starting statin as above. Suggested adding CoQ10 if she experiences myalgias - Plan  for FLP/CMP in 6-8 weeks for close monitoring (already ordered).   4. Palpitations: no complaints of palpitations/fluttering/racing heart beats at this time. She is on metoprolol tartrate prn - Will start metoprolol succinate 25mg  daily    Current medicines are reviewed at length with the patient today.  The patient does not have concerns regarding medicines.  The following changes have been made:  As above  Labs/ tests ordered today include:   Orders Placed This Encounter  Procedures  . CT CORONARY MORPH W/CTA COR W/SCORE W/CA W/CM &/OR WO/CM  . CT CORONARY FRACTIONAL FLOW RESERVE DATA PREP  . CT CORONARY FRACTIONAL FLOW RESERVE FLUID ANALYSIS  . Basic metabolic panel  . EKG 12-Lead     Disposition:   FU with me 1 week after her Coronary CTA   Signed, [name], PA-C  08/30/2019 5:15 PM

## 2019-08-30 NOTE — Patient Instructions (Addendum)
Medication Instructions:   START Metoprolol Succinate 50 mg daily  START Pantoprazole 40 mg daily  Take 50 mg (2 tablets) of Metoprolol Tartrate (Lopressor) 2 hours before your Coronary CTA   *If you need a refill on your cardiac medications before your next appointment, please call your pharmacy*  Lab Work: You will need to have labs drawn 1 week prior to your Coronary CTA:  BMET If you have labs (blood work) drawn today and your tests are completely normal, you will receive your results only by: Marland Kitchen MyChart Message (if you have MyChart) OR . A paper copy in the mail If you have any lab test that is abnormal or we need to change your treatment, we will call you to review the results.  Testing/Procedures: Your physician has requested that you have cardiac CT. Cardiac computed tomography (CT) is a painless test that uses an x-ray machine to take clear, detailed pictures of your heart. For further information please visit [email or web address]. Please follow instruction sheet as given.   Follow-Up: At [facility], you and your health needs are our priority.  As part of our continuing mission to provide you with exceptional heart care, we have created designated Provider Care Teams.  These Care Teams include your primary Cardiologist (physician) and Advanced Practice Providers (APPs -  Physician Assistants and Nurse Practitioners) who all work together to provide you with the care you need, when you need it.  Your next appointment:   1 week(s) after Coronary CTA  The format for your next appointment:   In Person  Provider:   Shelva Majestic, MD or Roby Lofts, PA-C  Other Instructions   Low-Sodium Eating Plan Sodium, which is an element that makes up salt, helps you maintain a healthy balance of fluids in your body. Too much sodium can increase your blood pressure and cause fluid and waste to be held in your body. Your health care provider or dietitian may recommend following this  plan if you have high blood pressure (hypertension), kidney disease, liver disease, or heart failure. Eating less sodium can help lower your blood pressure, reduce swelling, and protect your heart, liver, and kidneys. What are tips for following this plan? General guidelines  Most people on this plan should limit their sodium intake to 1,500-2,000 mg (milligrams) of sodium each day. Reading food labels   The Nutrition Facts label lists the amount of sodium in one serving of the food. If you eat more than one serving, you must multiply the listed amount of sodium by the number of servings.  Choose foods with less than 140 mg of sodium per serving.  Avoid foods with 300 mg of sodium or more per serving. Shopping  Look for lower-sodium products, often labeled as "low-sodium" or "no salt added."  Always check the sodium content even if foods are labeled as "unsalted" or "no salt added".  Buy fresh foods. ? Avoid canned foods and premade or frozen meals. ? Avoid canned, cured, or processed meats  Buy breads that have less than 80 mg of sodium per slice. Cooking  Eat more home-cooked food and less restaurant, buffet, and fast food.  Avoid adding salt when cooking. Use salt-free seasonings or herbs instead of table salt or sea salt. Check with your health care provider or pharmacist before using salt substitutes.  Cook with plant-based oils, such as canola, sunflower, or olive oil. Meal planning  When eating at a restaurant, ask that your food be prepared with less salt or no  salt, if possible.  Avoid foods that contain MSG (monosodium glutamate). MSG is sometimes added to [place] food, bouillon, and some canned foods. What foods are recommended? The items listed may not be a complete list. Talk with your dietitian about what dietary choices are best for you. Grains Low-sodium cereals, including oats, puffed wheat and rice, and shredded wheat. Low-sodium crackers. Unsalted rice.  Unsalted pasta. Low-sodium bread. Whole-grain breads and whole-grain pasta. Vegetables Fresh or frozen vegetables. "No salt added" canned vegetables. "No salt added" tomato sauce and paste. Low-sodium or reduced-sodium tomato and vegetable juice. Fruits Fresh, frozen, or canned fruit. Fruit juice. Meats and other protein foods Fresh or frozen (no salt added) meat, poultry, seafood, and fish. Low-sodium canned tuna and salmon. Unsalted nuts. Dried peas, beans, and lentils without added salt. Unsalted canned beans. Eggs. Unsalted nut butters. Dairy Milk. Soy milk. Cheese that is naturally low in sodium, such as ricotta cheese, fresh mozzarella, or Swiss cheese Low-sodium or reduced-sodium cheese. Cream cheese. Yogurt. Fats and oils Unsalted butter. Unsalted margarine with no trans fat. Vegetable oils such as canola or olive oils. Seasonings and other foods Fresh and dried herbs and spices. Salt-free seasonings. Low-sodium mustard and ketchup. Sodium-free salad dressing. Sodium-free light mayonnaise. Fresh or refrigerated horseradish. Lemon juice. Vinegar. Homemade, reduced-sodium, or low-sodium soups. Unsalted popcorn and pretzels. Low-salt or salt-free chips. What foods are not recommended? The items listed may not be a complete list. Talk with your dietitian about what dietary choices are best for you. Grains Instant hot cereals. Bread stuffing, pancake, and biscuit mixes. Croutons. Seasoned rice or pasta mixes. Noodle soup cups. Boxed or frozen macaroni and cheese. Regular salted crackers. Self-rising flour. Vegetables Sauerkraut, pickled vegetables, and relishes. Olives. [place] fries. Onion rings. Regular canned vegetables (not low-sodium or reduced-sodium). Regular canned tomato sauce and paste (not low-sodium or reduced-sodium). Regular tomato and vegetable juice (not low-sodium or reduced-sodium). Frozen vegetables in sauces. Meats and other protein foods Meat or fish that is salted, canned,  smoked, spiced, or pickled. Bacon, ham, sausage, hotdogs, corned beef, chipped beef, packaged lunch meats, salt pork, jerky, pickled herring, anchovies, regular canned tuna, sardines, salted nuts. Dairy Processed cheese and cheese spreads. Cheese curds. Blue cheese. Feta cheese. String cheese. Regular cottage cheese. Buttermilk. Canned milk. Fats and oils Salted butter. Regular margarine. Ghee. Bacon fat. Seasonings and other foods Onion salt, garlic salt, seasoned salt, table salt, and sea salt. Canned and packaged gravies. Worcestershire sauce. Tartar sauce. Barbecue sauce. Teriyaki sauce. Soy sauce, including reduced-sodium. Steak sauce. Fish sauce. Oyster sauce. Cocktail sauce. Horseradish that you find on the shelf. Regular ketchup and mustard. Meat flavorings and tenderizers. Bouillon cubes. Hot sauce and Tabasco sauce. Premade or packaged marinades. Premade or packaged taco seasonings. Relishes. Regular salad dressings. Salsa. Potato and tortilla chips. Corn chips and puffs. Salted popcorn and pretzels. Canned or dried soups. Pizza. Frozen entrees and pot pies. Summary  Eating less sodium can help lower your blood pressure, reduce swelling, and protect your heart, liver, and kidneys.  Most people on this plan should limit their sodium intake to 1,500-2,000 mg (milligrams) of sodium each day.  Canned, boxed, and frozen foods are high in sodium. Restaurant foods, fast foods, and pizza are also very high in sodium. You also get sodium by adding salt to food.  Try to cook at home, eat more fresh fruits and vegetables, and eat less fast food, canned, processed, or prepared foods. This information is not intended to replace advice given to you by  your health care provider. Make sure you discuss any questions you have with your health care provider. Document Revised: 06/13/2017 Document Reviewed: 06/24/2016 Elsevier Patient Education  2020 [place] cardiac CT will be scheduled at  one of the below locations:   [facility] [address] [place], [place] [address] [phone]  [facility] [address] [place], [place] [address] [phone]  If scheduled at [facility], please arrive at the [facility] main entrance of [facility] 30 minutes prior to test start time. Proceed to the [facility] Department (first floor) to check-in and test prep.  If scheduled at [facility], please arrive 15 mins early for check-in and test prep.  Please follow these instructions carefully (unless otherwise directed):  Hold all erectile dysfunction medications at least 3 days (72 hrs) prior to test.  On the Night Before the Test: . Be sure to Drink plenty of water. . Do not consume any caffeinated/decaffeinated beverages or chocolate 12 hours prior to your test. . Do not take any antihistamines 12 hours prior to your test. . If you take Metformin do not take 24 hours prior to test. . If the patient has contrast allergy: ? Patient will need a prescription for Prednisone and very clear instructions (as follows): 1. Prednisone 50 mg - take 13 hours prior to test 2. Take another Prednisone 50 mg 7 hours prior to test 3. Take another Prednisone 50 mg 1 hour prior to test 4. Take Benadryl 50 mg 1 hour prior to test . Patient must complete all four doses of above prophylactic medications. . Patient will need a ride after test due to Benadryl.  On the Day of the Test: . Drink plenty of water. Do not drink any water within one hour of the test. . Do not eat any food 4 hours prior to the test. . You may take your regular medications prior to the test.  . Take metoprolol (Lopressor) two hours prior to test. . HOLD Furosemide/Hydrochlorothiazide morning of the test. . FEMALES- please wear underwire-free bra if available   *For Clinical Staff only. Please instruct  patient the following:*        -Drink plenty of water       -Hold Furosemide/hydrochlorothiazide morning of the test       -Take metoprolol (Lopressor) 2 hours prior to test (if applicable).                  -If HR is less than 55 BPM- No Beta Blocker                -IF HR is greater than 55 BPM and patient is less than or equal to 6 yrs old Lopressor 100mg  x1.                -If HR is greater than 55 BPM and patient is greater than 72 yrs old Lopressor 50 mg x1.     Do not give Lopressor to patients with an allergy to lopressor or anyone with asthma or active COPD symptoms (currently taking steroids).       After the Test: . Drink plenty of water. . After receiving IV contrast, you may experience a mild flushed feeling. This is normal. . On occasion, you may experience a mild rash up to 24 hours after the test. This is not dangerous. If this occurs, you can  take Benadryl 25 mg and increase your fluid intake. . If you experience trouble breathing, this can be serious. If it is severe call 911 IMMEDIATELY. If it is mild, please call our office. . If you take any of these medications: Glipizide/Metformin, Avandament, Glucavance, please do not take 48 hours after completing test unless otherwise instructed.   Once we have confirmed authorization from your insurance company, we will call you to set up a date and time for your test.   For non-scheduling related questions, please contact the cardiac imaging nurse navigator should you have any questions/concerns: Marchia Bond, RN Navigator Cardiac Imaging [facility] [phone] mobile

## 2019-08-31 ENCOUNTER — Encounter: Payer: Self-pay | Admitting: Medical

## 2019-08-31 ENCOUNTER — Telehealth: Payer: Self-pay | Admitting: Medical

## 2019-08-31 MED ORDER — METOPROLOL SUCCINATE ER 50 MG PO TB24: 50.0000 mg | ORAL_TABLET | Freq: Every day | ORAL | 0 refills | Status: DC

## 2019-08-31 NOTE — Telephone Encounter (Signed)
*  STAT* If patient is at the pharmacy, call can be transferred to refill team.   1. Which medications need to be refilled? (please list name of each medication and dose if known) Metoprolol Succinate 50 mg daily  2. Which pharmacy/location (including street and city if local pharmacy) is medication to be sent to? [id number] [place], [place] - [address]  3. Do they need a 30 day or 90 day supply? 90 day   Patient states this medication wasn't called in yesterday at her visit. New medication she is to start.

## 2019-09-07 DIAGNOSIS — R8290 Unspecified abnormal findings in urine: Secondary | ICD-10-CM | POA: Diagnosis not present

## 2019-09-07 DIAGNOSIS — N200 Calculus of kidney: Secondary | ICD-10-CM | POA: Diagnosis not present

## 2019-09-07 DIAGNOSIS — R109 Unspecified abdominal pain: Secondary | ICD-10-CM | POA: Diagnosis not present

## 2019-09-10 ENCOUNTER — Other Ambulatory Visit: Payer: Self-pay

## 2019-09-10 ENCOUNTER — Ambulatory Visit (INDEPENDENT_AMBULATORY_CARE_PROVIDER_SITE_OTHER): Payer: Medicare Other | Admitting: *Deleted

## 2019-09-10 DIAGNOSIS — B351 Tinea unguium: Secondary | ICD-10-CM

## 2019-09-10 DIAGNOSIS — L603 Nail dystrophy: Secondary | ICD-10-CM

## 2019-09-10 NOTE — Progress Notes (Signed)
Patient presents today for the 1st laser treatment. Diagnosed with mycotic nail infection by Dr. Hyatt.   Toenail most affected hallux left.  All other systems are negative.  Nails were filed thin. Laser therapy was administered to 1st toenails left and patient tolerated the treatment well. All safety precautions were in place.    Follow up in 4 weeks for laser # 2.  Picture of nails taken today to document visual progress  

## 2019-09-10 NOTE — Patient Instructions (Signed)

## 2019-09-14 ENCOUNTER — Telehealth: Payer: Self-pay | Admitting: Family Medicine

## 2019-09-14 NOTE — Telephone Encounter (Signed)
LVM for pt regarding her concerns with taking her medication Macrobid for the bacteria in her urin and letting her know that it should not have any effect on her taking the COVID vac and advised her that if she has any other concerns or questions to give [place] a call back at the office.

## 2019-09-14 NOTE — Telephone Encounter (Signed)
Pt stated she was given Macrobid for bacteria in her urine culture. She wants to know if she should start rx if she wants to get the Covid vaccine or should she wait. Please advise.

## 2019-09-20 ENCOUNTER — Other Ambulatory Visit: Payer: Self-pay

## 2019-09-20 ENCOUNTER — Ambulatory Visit: Payer: Medicare Other | Attending: Internal Medicine

## 2019-09-20 DIAGNOSIS — Z20822 Contact with and (suspected) exposure to covid-19: Secondary | ICD-10-CM | POA: Diagnosis not present

## 2019-09-21 LAB — NOVEL CORONAVIRUS, NAA: SARS-CoV-2, NAA: NOT DETECTED

## 2019-09-22 ENCOUNTER — Other Ambulatory Visit: Payer: Self-pay | Admitting: *Deleted

## 2019-09-22 ENCOUNTER — Telehealth: Payer: Self-pay

## 2019-09-22 ENCOUNTER — Telehealth: Payer: Self-pay | Admitting: Cardiovascular Disease

## 2019-09-22 DIAGNOSIS — Z79899 Other long term (current) drug therapy: Secondary | ICD-10-CM

## 2019-09-22 MED ORDER — METOPROLOL SUCCINATE ER 50 MG PO TB24: 25.0000 mg | ORAL_TABLET | Freq: Every day | ORAL | 0 refills | Status: DC

## 2019-09-22 MED ORDER — METOPROLOL SUCCINATE ER 25 MG PO TB24: 25.0000 mg | ORAL_TABLET | Freq: Every day | ORAL | 0 refills | Status: DC

## 2019-09-22 NOTE — Telephone Encounter (Signed)
Pt c/o medication issue:  1. Name of Medication:  metoprolol succinate (TOPROL-XL) 50 MG 24 hr tablet  2. How are you currently taking this medication (dosage and times per day)? Pt has not taken  3. Are you having a reaction (difficulty breathing--STAT)? n/a  4. What is your medication issue? Pt hasn't started taking this medication because her BP has been good. She has only been taking metoprolol tartrate (LOPRESSOR) 25 MG tablet as needed. She wanted to know how much of  the "as needed" medication is safe to take. She wanted to know what to do about taking the daily medicine vs the as needed medicine      Pt also mentioned she has  been having some pain under her L breast. It is a pain that comes and goes,. It is a sharp , stabbing like pain,  which is the same pain that prompted her to make her last visit to the office. It has only been going on for a couple days. She wanted to make sure it wasn't a cardiac issue. This was not the initial reason for her call, but it was mentioned in the conversation

## 2019-09-22 NOTE — Telephone Encounter (Signed)
Patient called and she was informed that her test for COVID-19 09/20/19 was negative not detected.  She verbalized understanding of all information.

## 2019-09-22 NOTE — Telephone Encounter (Signed)
Spoke with pt at great length  Pt was awakened this am with left breast pain and this was constant Per pt has only been taking the short acting Metoprolol B/P has been ranging 125-149/74-91 HR 63/68 Also pt has been taking the Pantoprazole 40 mg and has not seen much change in symptoms was wandering if should see GI. Pt is also due for mammogram and has hx of breast cancer . Pt has put off the Coronary CT as does not want to have a lot of dye and radiation Encouraged pt should proceed with CT pt  is still apprehensive Will forward message to Dr Claiborne Billings for review and recommendations./cy

## 2019-09-23 ENCOUNTER — Other Ambulatory Visit: Payer: Self-pay

## 2019-09-23 ENCOUNTER — Telehealth: Payer: Self-pay | Admitting: Cardiovascular Disease

## 2019-09-23 ENCOUNTER — Encounter (HOSPITAL_COMMUNITY): Payer: Self-pay

## 2019-09-23 ENCOUNTER — Emergency Department (HOSPITAL_COMMUNITY)
Admission: EM | Admit: 2019-09-23 | Discharge: 2019-09-23 | Disposition: A | Discharge status: Home / Self Care | Payer: Medicare Other | Attending: Emergency Medicine | Admitting: Emergency Medicine

## 2019-09-23 ENCOUNTER — Emergency Department (HOSPITAL_COMMUNITY): Payer: Medicare Other

## 2019-09-23 ENCOUNTER — Ambulatory Visit: Payer: Medicare Other | Admitting: Family Medicine

## 2019-09-23 DIAGNOSIS — I10 Essential (primary) hypertension: Secondary | ICD-10-CM | POA: Diagnosis not present

## 2019-09-23 DIAGNOSIS — Z7982 Long term (current) use of aspirin: Secondary | ICD-10-CM | POA: Diagnosis not present

## 2019-09-23 DIAGNOSIS — Z79899 Other long term (current) drug therapy: Secondary | ICD-10-CM | POA: Diagnosis not present

## 2019-09-23 DIAGNOSIS — R079 Chest pain, unspecified: Secondary | ICD-10-CM

## 2019-09-23 DIAGNOSIS — J189 Pneumonia, unspecified organism: Secondary | ICD-10-CM

## 2019-09-23 DIAGNOSIS — R0789 Other chest pain: Secondary | ICD-10-CM | POA: Diagnosis not present

## 2019-09-23 DIAGNOSIS — I2510 Atherosclerotic heart disease of native coronary artery without angina pectoris: Secondary | ICD-10-CM | POA: Insufficient documentation

## 2019-09-23 LAB — BASIC METABOLIC PANEL
Anion gap: 13 (ref 5–15)
BUN: 17 mg/dL (ref 8–23)
CO2: 27 mmol/L (ref 22–32)
Calcium: 9.9 mg/dL (ref 8.9–10.3)
Chloride: 101 mmol/L (ref 98–111)
Creatinine, Ser: 0.67 mg/dL (ref 0.44–1.00)
GFR calc Af Amer: >60 mL/min (ref >60)
GFR calc non Af Amer: >60 mL/min (ref >60)
Glucose, Bld: 115 mg/dL — ABNORMAL HIGH (ref 70–99)
Potassium: 4.7 mmol/L (ref 3.5–5.1)
Sodium: 141 mmol/L (ref 135–145)

## 2019-09-23 LAB — CBC
HCT: 45.9 % (ref 36.0–46.0)
Hemoglobin: 14.9 g/dL (ref 12.0–15.0)
MCH: 30.9 pg (ref 26.0–34.0)
MCHC: 32.5 g/dL (ref 30.0–36.0)
MCV: 95.2 fL (ref 80.0–100.0)
Platelets: 255 10*3/uL (ref 150–400)
RBC: 4.82 MIL/uL (ref 3.87–5.11)
RDW: 13.6 % (ref 11.5–15.5)
WBC: 9.9 10*3/uL (ref 4.0–10.5)
nRBC: 0 % (ref 0.0–0.2)

## 2019-09-23 LAB — D-DIMER, QUANTITATIVE: D-Dimer, Quant: 0.45 ug/mL-FEU (ref 0.00–0.50)

## 2019-09-23 LAB — TROPONIN I (HIGH SENSITIVITY): Troponin I (High Sensitivity): 4 ng/L (ref <18)

## 2019-09-23 IMAGING — CR DG CHEST 2V
2 series · 2 of 2 positions shown · non-contrast
Comparison: [DATE]

CLINICAL DATA: Left chest pain since yesterday.

EXAM:
CHEST - 2 VIEW

[chest lat]
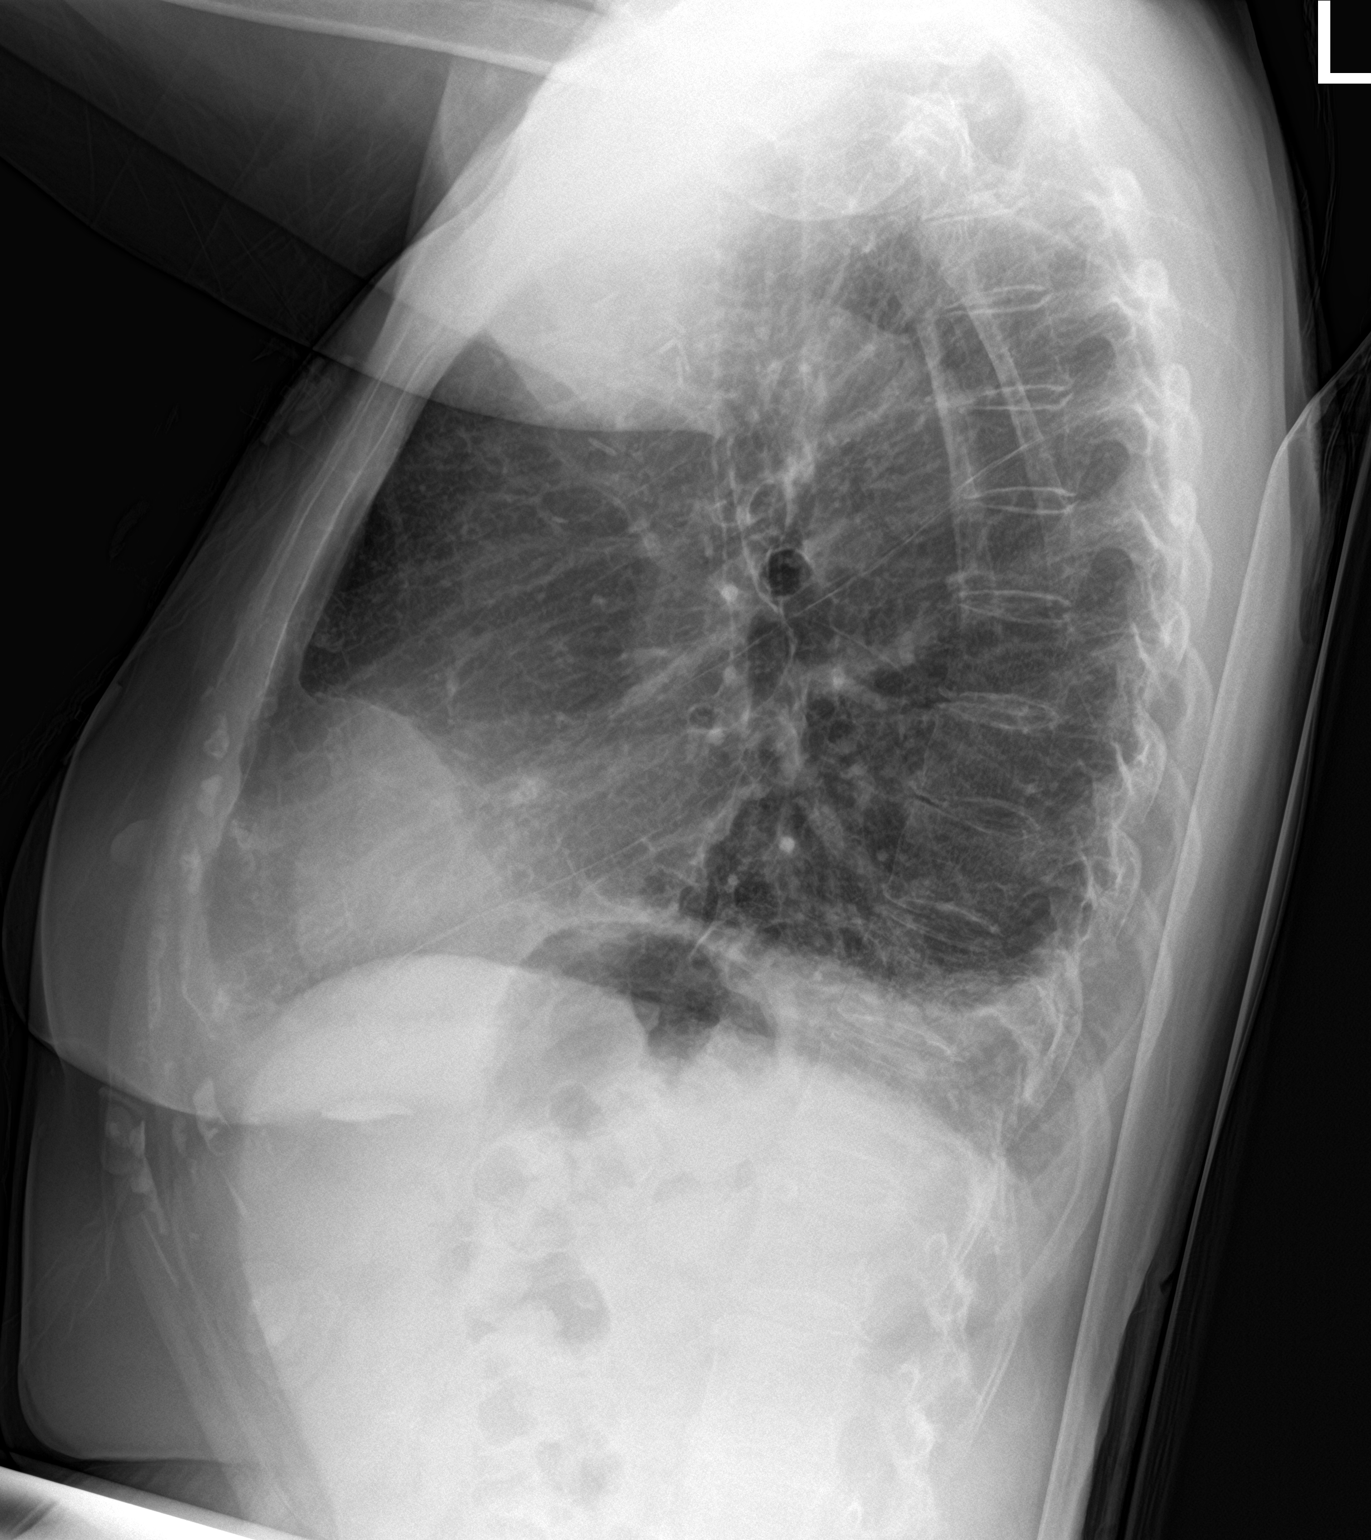

[chest ap]
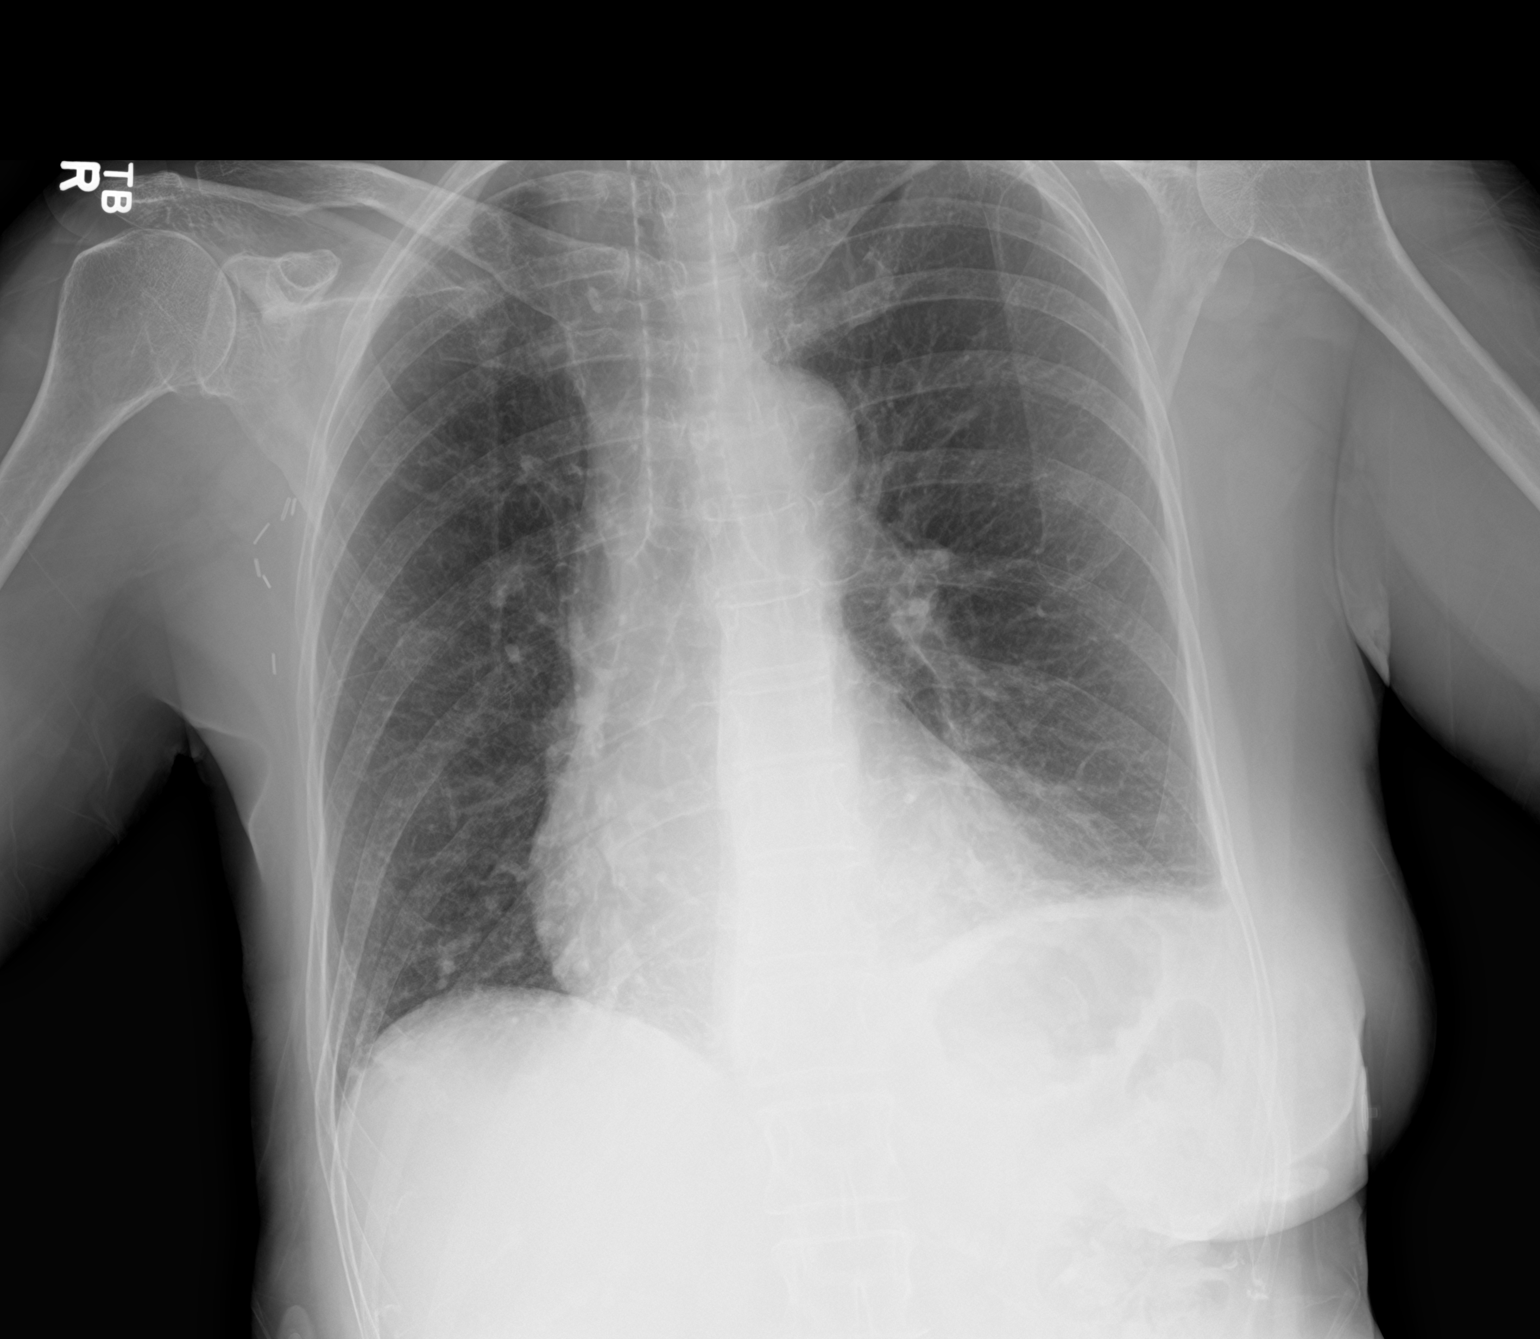

[2 of 2 positions shown; findings below may reference images not displayed]

FINDINGS: The mediastinal contour and cardiac silhouette are normal. Mild
patchy opacity of left lung base with small left pleural effusion is
identified. The right lung is clear. There is no pulmonary edema.
Soft tissue and osseous structures are stable.
IMPRESSION: Mild patchy opacity of left lung base with small left pleural
effusion, developing pneumonia is excluded.

## 2019-09-23 MED ORDER — LIDOCAINE VISCOUS HCL 2 % MT SOLN
15.0000 mL | Freq: Once | OROMUCOSAL | Status: AC
  Administered 2019-09-23: 09:00:00 15 mL via ORAL
  Filled 2019-09-23: qty 15

## 2019-09-23 MED ORDER — ALUM & MAG HYDROXIDE-SIMETH 200-200-20 MG/5ML PO SUSP
30.0000 mL | Freq: Once | ORAL | Status: AC
  Administered 2019-09-23: 09:00:00 30 mL via ORAL
  Filled 2019-09-23: qty 30

## 2019-09-23 MED ORDER — DOXYCYCLINE HYCLATE 100 MG PO CAPS: 100.0000 mg | ORAL_CAPSULE | Freq: Two times a day (BID) | ORAL | 0 refills | Status: DC

## 2019-09-23 MED ORDER — PREDNISONE 20 MG PO TABS: 40.0000 mg | ORAL_TABLET | Freq: Every day | ORAL | 0 refills | Status: DC

## 2019-09-23 MED ORDER — HYDROCODONE-ACETAMINOPHEN 5-325 MG PO TABS
1.0000 | ORAL_TABLET | Freq: Once | ORAL | Status: AC
  Administered 2019-09-23: 1 via ORAL
  Filled 2019-09-23: qty 1

## 2019-09-23 NOTE — ED Triage Notes (Signed)
Pt reports intermittent left sided chest pain since yesterday, most recent episode woke her out of her sleep around 3am, pt tearful in triage, clenching chest. Denies any other symptoms. Pt a.o

## 2019-09-23 NOTE — ED Notes (Signed)
Main lab to add on D-dimer.

## 2019-09-23 NOTE — Telephone Encounter (Signed)
Patient states that she was supposed to have blood work done before her CT on 09/29/19. She went to the ED today and blood work was done there, she wants to know if she will still need to have blood work done at the office?

## 2019-09-23 NOTE — ED Notes (Signed)
Pt dc'd home w/all belongings

## 2019-09-23 NOTE — ED Provider Notes (Signed)
[facility] EMERGENCY DEPARTMENT Provider Note   CSN: [id number] Arrival date & time: 09/23/19  0746     History Chief Complaint  Patient presents with  . Chest Pain    Cheyenne Sanders is a 69 y.o. female.  HPI Patient presents with left-sided chest pain.  Began since around 3 in the morning last night but had episodes of it over the last month.  Has been seen by cardiology.  Has had echocardiogram that was overall reassuring.  Had cardiac CT scheduled but is not gone yet.  Not exertional.  Not associate eating.  States the pain is sharp.  Worse with movements.  Worse with breathing.  States she does feel little short of breath.  Has a history of breast cancer on the other side.  No fevers.  No cough.  Had had some previous swelling in her leg, however was reportedly worked up for DVT with no DVT seen.  No fevers.  No chills.  No cough.  No nausea or vomiting.  States it hurts to move.    Past Medical History:  Diagnosis Date  . Breast cancer ([place]) [id number]   R breast cancer x 2; lumpectomy with radiation, chemotherapy; mastectomy R.  . Nephrolithiasis   . Personal history of chemotherapy   . Personal history of radiation therapy     Patient Active Problem List   Diagnosis Date Noted  . Skin infection 11/25/2017  . Tick bite 11/25/2017  . Flank pain 09/06/2016  . Recurrent nephrolithiasis 08/27/2016  . Personal history of breast cancer 08/27/2016  . Coronary artery disease involving native coronary artery of native heart without angina pectoris 06/28/2015  . Essential hypertension 06/28/2015  . Mixed hyperlipidemia 06/28/2015  . Precordial chest pain 06/28/2015    Past Surgical History:  Procedure Laterality Date  . ABDOMINAL HYSTERECTOMY  1986   DUB; ovaries intact.  Marland Kitchen BREAST BIOPSY Left 2016  . BREAST SURGERY Right 2014  . cardiac stenting  07/15/1989  . MASTECTOMY Right    2014     OB History   No obstetric history on file.     Family  History  Problem Relation Age of Onset  . Heart disease Mother   . Cancer Father   . Breast cancer Neg Hx     Social History   Tobacco Use  . Smoking status: Never Smoker  . Smokeless tobacco: Never Used  Substance Use Topics  . Alcohol use: Yes    Alcohol/week: 1.0 standard drinks    Types: 1 Glasses of wine per week  . Drug use: No    Home Medications Prior to Admission medications   Medication Sig Start Date End Date Taking? Authorizing Provider  aspirin 325 MG tablet Take 325 mg by mouth as needed.   Yes [provider]  Cholecalciferol (D3-1000) 25 MCG (1000 UT) capsule Take 1,000 Units by mouth daily.   Yes [provider]  metoprolol tartrate (LOPRESSOR) 25 MG tablet Take 1 tablet (25 mg total) by mouth as needed. 08/03/19 11/01/19 Yes [place] Sine, MD  Multiple Vitamins-Minerals (PRESERVISION AREDS 2 PO) Take by mouth.   Yes [provider]  pantoprazole (PROTONIX) 40 MG tablet Take 1 tablet (40 mg total) by mouth daily. 08/30/19  Yes Kroeger, Daleen Snook M., PA-C  doxycycline (VIBRAMYCIN) 100 MG capsule Take 1 capsule (100 mg total) by mouth 2 (two) times daily. 09/23/19   Davonna Belling, MD  metoprolol succinate (TOPROL-XL) 25 MG 24 hr tablet Take 1  tablet (25 mg total) by mouth daily. Take with or immediately following a meal. 09/22/19 12/21/19  Kroeger, Lorelee Cover., PA-C  NONFORMULARY OR COMPOUNDED ITEM [facility]:  Onychomycosis Nail Lacquer - Fluconazole 2%, Terbinafine 1%, DMSO, apply to affected area daily. Patient not taking: Reported on 09/23/2019 01/07/18   Hyatt, Max T, DPM  predniSONE (DELTASONE) 20 MG tablet Take 2 tablets (40 mg total) by mouth daily. 09/23/19   Davonna Belling, MD  rosuvastatin (CRESTOR) 20 MG tablet Take 1 tablet (20 mg total) by mouth daily. Patient taking differently: Take 10 mg by mouth daily.  08/03/19 11/01/19  [place] Sine, MD    Allergies    Fentanyl  Review of Systems   Review of Systems    Constitutional: Negative for appetite change.  HENT: Negative for congestion.   Respiratory: Positive for shortness of breath.   Cardiovascular: Positive for chest pain.  Gastrointestinal: Negative for abdominal pain.  Genitourinary: Negative for enuresis.  Musculoskeletal: Positive for back pain.  Skin: Negative for rash.  Neurological: Negative for weakness.  Psychiatric/Behavioral: Negative for confusion.    Physical Exam Updated Vital Signs BP (!) 137/58 (BP Location: Right Arm)   Pulse (!) 102   Temp 98 F (36.7 C) (Oral)   Resp 20   SpO2 97%   Physical Exam Vitals and nursing note reviewed.  HENT:     Head: Normocephalic.  Cardiovascular:     Rate and Rhythm: Normal rate and regular rhythm.  Pulmonary:     Breath sounds: No wheezing or rhonchi.  Chest:     Comments: Tenderness to left anterior lower chest wall.  No rash.  Lungs clear.  No thoracic spine tenderness. Abdominal:     Comments: No upper abdominal tenderness.  No rebound or guarding.  No abdominal mass.  Musculoskeletal:     Right lower leg: No tenderness. No edema.     Left lower leg: No tenderness. No edema.  Skin:    General: Skin is warm.     Capillary Refill: Capillary refill takes less than 2 seconds.  Neurological:     Mental Status: She is alert.     ED Results / Procedures / Treatments   Labs (all labs ordered are listed, but only abnormal results are displayed) Labs Reviewed  BASIC METABOLIC PANEL - Abnormal; Notable for the following components:      Result Value   Glucose, Bld 115 (*)    All other components within normal limits  CBC  D-DIMER, QUANTITATIVE (NOT AT [facility])  TROPONIN I (HIGH SENSITIVITY)  TROPONIN I (HIGH SENSITIVITY)    EKG EKG Interpretation  Date/Time:  Thursday September 23 2019 07:47:01 EST Ventricular Rate:  87 PR Interval:  120 QRS Duration: 68 QT Interval:  376 QTC Calculation: 452 R Axis:   66 Text Interpretation: Sinus rhythm with frequent Premature  ventricular complexes Otherwise normal ECG Confirmed by Davonna Belling [phone]) on 09/23/2019 8:31:10 AM Also confirmed by Davonna Belling [phone]), editor Hattie Perch ([id number])  on 09/23/2019 10:53:55 AM   Radiology DG Chest 2 View  Result Date: 09/23/2019 CLINICAL DATA:  Left chest pain since yesterday. EXAM: CHEST - 2 VIEW COMPARISON:  August 27, 2016 FINDINGS: The mediastinal contour and cardiac silhouette are normal. Mild patchy opacity of left lung base with small left pleural effusion is identified. The right lung is clear. There is no pulmonary edema. Soft tissue and osseous structures are stable. IMPRESSION: Mild patchy opacity of left lung base with small left pleural effusion,  developing pneumonia is excluded. Electronically Signed   By: Abelardo Diesel M.D.   On: 09/23/2019 08:35    Procedures Procedures (including critical care time)  Medications Ordered in ED Medications  alum & mag hydroxide-simeth (MAALOX/MYLANTA) 200-200-20 MG/5ML suspension 30 mL (30 mLs Oral Given 09/23/19 0920)    And  lidocaine (XYLOCAINE) 2 % viscous mouth solution 15 mL (15 mLs Oral Given 09/23/19 0920)  HYDROcodone-acetaminophen (NORCO/VICODIN) 5-325 MG per tablet 1 tablet (1 tablet Oral Given 09/23/19 1106)    ED Course  I have reviewed the triage vital signs and the nursing notes.  Pertinent labs & imaging results that were available during my care of the patient were reviewed by me and considered in my medical decision making (see chart for details).    MDM Rules/Calculators/A&P                      Patient with chest pain.  Sharp left-sided.  Has been there for few weeks.  X-ray showed possible pneumonia.  Has not had a cough however it is in the right location and with continued pain will treat.  Negative D-dimer.  Has had cardiac echocardiogram and now negative enzymes again.  Discharge home with cardiology follow-up.  No relief with GI cocktail.  Discharge home for PCP follow-up.  Will  also give steroids to help with pleurisy. Final Clinical Impression(s) / ED Diagnoses Final diagnoses:  Nonspecific chest pain  Community acquired pneumonia of left lower lobe of lung    Rx / DC Orders ED Discharge Orders         Ordered    doxycycline (VIBRAMYCIN) 100 MG capsule  2 times daily     09/23/19 1058    predniSONE (DELTASONE) 20 MG tablet  Daily     09/23/19 1058           Davonna Belling, MD 09/23/19 1458

## 2019-09-23 NOTE — Telephone Encounter (Signed)
Reviewed labs completed today when pt visited ED for CP. Labs normal. Pt aware she would not need to stop by office Monday for labs, as previously instructed. Patient verbalized understanding and agreeable to plan.

## 2019-09-23 NOTE — ED Notes (Signed)
ED Provider at bedside. 

## 2019-09-24 ENCOUNTER — Telehealth: Payer: Self-pay

## 2019-09-24 NOTE — Telephone Encounter (Signed)
Spoke with pt. She states that she went to the ER yesterday for this pain in her breast that she had originally called into our office about and that her "blood tests and heart test" were fine. Reports that she was diagnosed with fluid in her left lung.  She states that she has not been taking her metoprolol succinate as she should but she did take some of her PRN metoprolol tartrate with he pains.  She states she doesn't know if she needs to be on the metoprolol succinate since her blood pressures run in the 130s/80s Her Coronary CT is on Wednesday per pt and she needs to know what medications to take because the instructions are confusing.  Notified that I would discuss with Dr.Kelly and get back with her. Verbalized understanding.

## 2019-09-24 NOTE — Telephone Encounter (Signed)
Called and spoke with pt to review Dr.Kelly's new orders. Notified that Per Dr.Kelly pt should start taking Metoprolol succinate 25mg  daily. On the day of her Coronary CT the patient should also take 50mg  of Metoprolol tartrate 2 hours prior to her test. RN reviewed Coronary CT instructions with pt in depth. Notified that since she had the metoprolol succinate in 50mg  form to only take half = 25mg . And for the tartrate prior to her test to take 2 so it would equal 50mg .  Pt verbalized understanding by read-back method, saying "take metoprolol succinate 25mg  daily and take the metoprolol tartrate 50mg  (2 tablets since I have them in the 25mg  form) 2 hours prior to my test." Pt had no further questions at this time.

## 2019-09-24 NOTE — Telephone Encounter (Signed)
Spoke with pt, she is having concerns with her current regimen of meds and the meds given from her er visit. PCP did review, pt knows it is ok to take her medication as prescribed. She will follow up with pcp once antibiotic is complete

## 2019-09-24 NOTE — Telephone Encounter (Signed)
I believe the patient was started on long-acting metoprolol succinate 25 mg at her last office visit.  Recommend increasing this to 1-1/2 pills or 37.5 mg daily.  Would recommend CT.  Okay for GI evaluation.

## 2019-09-24 NOTE — Telephone Encounter (Signed)
Spoke with Dr.Kelly over the phone. With this new information, He recommends we just start the Metoprolol Succinate 25mg  daily. And prior to her Coronary CT she is to take 50mg  of Metoprolol tartrate 2 hours prior to exam.

## 2019-09-28 ENCOUNTER — Ambulatory Visit (INDEPENDENT_AMBULATORY_CARE_PROVIDER_SITE_OTHER): Payer: Medicare Other | Admitting: Family Medicine

## 2019-09-28 ENCOUNTER — Telehealth (HOSPITAL_COMMUNITY): Payer: Self-pay | Admitting: Emergency Medicine

## 2019-09-28 ENCOUNTER — Other Ambulatory Visit: Payer: Self-pay

## 2019-09-28 ENCOUNTER — Encounter: Payer: Self-pay | Admitting: Family Medicine

## 2019-09-28 ENCOUNTER — Telehealth: Payer: Self-pay | Admitting: Cardiovascular Disease

## 2019-09-28 VITALS — BP 134/76 | HR 67 | Temp 97.6°F | Resp 15 | Ht 65.0 in | Wt 128.8 lb

## 2019-09-28 DIAGNOSIS — Z8701 Personal history of pneumonia (recurrent): Secondary | ICD-10-CM | POA: Diagnosis not present

## 2019-09-28 DIAGNOSIS — N200 Calculus of kidney: Secondary | ICD-10-CM | POA: Diagnosis not present

## 2019-09-28 DIAGNOSIS — R932 Abnormal findings on diagnostic imaging of liver and biliary tract: Secondary | ICD-10-CM | POA: Diagnosis not present

## 2019-09-28 DIAGNOSIS — E782 Mixed hyperlipidemia: Secondary | ICD-10-CM | POA: Diagnosis not present

## 2019-09-28 DIAGNOSIS — I10 Essential (primary) hypertension: Secondary | ICD-10-CM | POA: Diagnosis not present

## 2019-09-28 NOTE — Telephone Encounter (Signed)
  Pt c/o medication issue:  1. Name of Medication:   metoprolol tartrate  50 mg    2. How are you currently taking this medication (dosage and times per day)?   3. Are you having a reaction (difficulty breathing--STAT)?   4. What is your medication issue? Pt is calling would like to speak with a nurse, she said she was instructed to take this medicine to lower HR before her CT, however, her HR has been very low and worried to take it.   Please call

## 2019-09-28 NOTE — Telephone Encounter (Signed)
Reaching out to patient to offer assistance regarding upcoming cardiac imaging study; pt verbalizes understanding of appt date/time, parking situation and where to check in, pre-test NPO status and medications ordered, and verified current allergies; name and call back number provided for further questions should they arise Valdez Brannan RN Navigator Cardiac Imaging  Heart and Vascular [phone] office [phone] cell 

## 2019-09-28 NOTE — Progress Notes (Signed)
3/16/20214:13 PM  Cheyenne Sanders 10-24-1950, 69 y.o., female [id number]  Chief Complaint  Patient presents with  . Hospitalization Follow-up    pt following up from ED visit for Chest Pain, dx as pneumonia of left lower lobe, pt states it had been going on for some and went bc it was worse. has finished steroids, still taking ABX    HPI:   Patient is a 68 y.o. female with past medical history significant for HTN, PVCs, HLP, kidney stones, breast cancer who presents today for ER fu pneumonia  Seen in ER 3/11, LLL PNA, doxy and pred Completed pred, has not completed abx She is overall feeling better Left ant chest pain better She never had a cough  Cards started metoprolol for PVCs Echo Jan 2021 - normal LVEF, mild MR and TR Cards has ordered CT coronary arteries Has not started crestor Checks BP at home, today 125/72, HR 63  Dr Rosana Hoes recently treated UTI with macrobid, renal US showed bilateral nonobstructive kidney stones, no intervention recommended at this time  Incidental finding of GB sludge vs stones, patient denies any RUQ pain, she thinks that she has been told in [place] that she has GB stones  PPI was started by cards and she was told by ER to stop    Depression screen [facility] 2/9 09/28/2019 08/17/2019 06/03/2019  Decreased Interest 0 0 0  Down, Depressed, Hopeless 0 0 0  PHQ - 2 Score 0 0 0    Fall Risk  09/28/2019 08/17/2019 06/03/2019 02/10/2019 06/17/2018  Falls in the past year? 0 0 0 0 1  Number falls in past yr: - 0 0 0 0  Injury with Fall? - 0 0 0 0  Follow up Falls evaluation completed - - Falls evaluation completed -     Allergies  Allergen Reactions  . Fentanyl Shortness Of Breath    Prior to Admission medications   Medication Sig Start Date End Date Taking? Authorizing Provider  aspirin 325 MG tablet Take 325 mg by mouth as needed.   Yes [provider]  Cholecalciferol (D3-1000) 25 MCG (1000 UT) capsule Take 1,000 Units by mouth daily.    Yes [provider]  doxycycline (VIBRAMYCIN) 100 MG capsule Take 1 capsule (100 mg total) by mouth 2 (two) times daily. 09/23/19  Yes Davonna Belling, MD  metoprolol succinate (TOPROL-XL) 25 MG 24 hr tablet Take 1 tablet (25 mg total) by mouth daily. Take with or immediately following a meal. 09/22/19 12/21/19 Yes Kroeger, Lorelee Cover., PA-C  metoprolol tartrate (LOPRESSOR) 25 MG tablet Take 1 tablet (25 mg total) by mouth as needed. 08/03/19 11/01/19 Yes [place] Sine, MD  Multiple Vitamins-Minerals (PRESERVISION AREDS 2 PO) Take by mouth.   Yes [provider]  rosuvastatin (CRESTOR) 20 MG tablet Take 1 tablet (20 mg total) by mouth daily. Patient taking differently: Take 10 mg by mouth daily.  08/03/19 11/01/19 Yes [place] Sine, MD  NONFORMULARY OR COMPOUNDED ITEM [facility]:  Onychomycosis Nail Lacquer - Fluconazole 2%, Terbinafine 1%, DMSO, apply to affected area daily. Patient not taking: Reported on 09/23/2019 01/07/18   Hyatt, Max T, DPM  pantoprazole (PROTONIX) 40 MG tablet Take 1 tablet (40 mg total) by mouth daily. Patient not taking: Reported on 09/28/2019 08/30/19   [name]., PA-C  predniSONE (DELTASONE) 20 MG tablet Take 2 tablets (40 mg total) by mouth daily. Patient not taking: Reported on 09/28/2019 09/23/19   Davonna Belling, MD    Past  Medical History:  Diagnosis Date  . Breast cancer ([place]) [id number]   R breast cancer x 2; lumpectomy with radiation, chemotherapy; mastectomy R.  . Nephrolithiasis   . Personal history of chemotherapy   . Personal history of radiation therapy     Past Surgical History:  Procedure Laterality Date  . ABDOMINAL HYSTERECTOMY  1986   DUB; ovaries intact.  Marland Kitchen BREAST BIOPSY Left 2016  . BREAST SURGERY Right 2014  . cardiac stenting  07/15/1989  . MASTECTOMY Right    2014    Social History   Tobacco Use  . Smoking status: Never Smoker  . Smokeless tobacco: Never Used  Substance Use Topics  . Alcohol use: Yes     Alcohol/week: 1.0 standard drinks    Types: 1 Glasses of wine per week    Family History  Problem Relation Age of Onset  . Heart disease Mother   . Cancer Father   . Breast cancer Neg Hx     Review of Systems  Constitutional: Negative for chills and fever.  Respiratory: Positive for shortness of breath. Negative for cough.   Cardiovascular: Negative for chest pain, palpitations and leg swelling.  Gastrointestinal: Negative for abdominal pain, nausea and vomiting.     OBJECTIVE:  Today's Vitals   09/28/19 1556 09/28/19 1657  BP: (!) 152/89 134/76  Pulse: 67   Resp: 15   Temp: 97.6 F (36.4 C)   TempSrc: Temporal   SpO2: 98%   Weight: 128 lb 12.8 oz (58.4 kg)   Height: 5\' 5"  (1.651 m)    Body mass index is 21.43 kg/m.   Physical Exam Vitals and nursing note reviewed.  Constitutional:      Appearance: She is well-developed.  HENT:     Head: Normocephalic and atraumatic.     Mouth/Throat:     Pharynx: No oropharyngeal exudate.  Eyes:     General: No scleral icterus.    Conjunctiva/sclera: Conjunctivae normal.     Pupils: Pupils are equal, round, and reactive to light.  Cardiovascular:     Rate and Rhythm: Normal rate and regular rhythm.     Heart sounds: Murmur present. No friction rub. No gallop.   Pulmonary:     Effort: Pulmonary effort is normal.     Breath sounds: Normal breath sounds. No wheezing, rhonchi or rales.  Abdominal:     General: Bowel sounds are normal.     Palpations: Abdomen is soft. There is no mass.     Tenderness: There is no abdominal tenderness.  Musculoskeletal:     Cervical back: Neck supple.  Skin:    General: Skin is warm and dry.  Neurological:     Mental Status: She is alert and oriented to person, place, and time.     No results found for this or any previous visit (from the past 24 hour(s)).  No results found.   ASSESSMENT and PLAN  1. History of pneumonia Recovering well. Complete abx.   2. Essential  hypertension Controlled. Continue current regime.   3. Mixed hyperlipidemia Managed by cards, has not started crestor, CT coronary arteries pending  4. Recurrent nephrolithiasis Managed by urology, non obstructing, monitor clinically  5. Abnormal gallbladder ultrasound Incidental finding on renal US. Patient asymptomatic. Monitor clinically  Return in about 3 months (around 12/29/2019).    Rutherford Guys, MD [facility] [address], [place] [address] Ph.  [phone] Fax [phone]

## 2019-09-28 NOTE — Telephone Encounter (Signed)
Pt advised that is her HR is below 55 bpm the time she has to take her Lopressor to hold it and to take with her to her appt. Pt verbalized understanding and agreed.

## 2019-09-28 NOTE — Patient Instructions (Signed)
° ° ° °  If you have lab work done today you will be contacted with your lab results within the next 2 weeks.  If you have not heard from us then please contact us. The fastest way to get your results is to register for My Chart. ° ° °IF you received an x-ray today, you will receive an invoice from [facility]. Please contact [facility] at [phone] with questions or concerns regarding your invoice.  ° °IF you received labwork today, you will receive an invoice from [facility]. Please contact [facility] at [phone] with questions or concerns regarding your invoice.  ° °Our billing staff will not be able to assist you with questions regarding bills from these companies. ° °You will be contacted with the lab results as soon as they are available. The fastest way to get your results is to activate your My Chart account. Instructions are located on the last page of this paperwork. If you have not heard from us regarding the results in 2 weeks, please contact this office. °  ° ° ° °

## 2019-09-29 ENCOUNTER — Ambulatory Visit (HOSPITAL_COMMUNITY)
Admission: RE | Admit: 2019-09-29 | Discharge: 2019-09-29 | Disposition: A | Discharge status: Home / Self Care | Payer: Medicare Other | Source: Ambulatory Visit | Attending: Medical | Admitting: Medical

## 2019-09-29 DIAGNOSIS — R072 Precordial pain: Secondary | ICD-10-CM | POA: Diagnosis not present

## 2019-09-29 DIAGNOSIS — R0600 Dyspnea, unspecified: Secondary | ICD-10-CM

## 2019-09-29 DIAGNOSIS — R0609 Other forms of dyspnea: Secondary | ICD-10-CM

## 2019-09-29 IMAGING — CT CT HEART MORP W/ CTA COR W/ SCORE W/ CA W/CM &/OR W/O CM
4 of 7 series · 8 of 20 positions shown, 9 images · IV contrast (APPLIED)
Comparison: None.
COMPARISON: None.

Addendum:
EXAM:
OVER-READ INTERPRETATION  CT CHEST

The following report is an over-read performed by radiologist Dr.
DAISY [REDACTED] on [DATE]. This over-read
does not include interpretation of cardiac or coronary anatomy or
pathology. The coronary CTA interpretation by the cardiologist is
attached.
HISTORY: 68 yo female with chest pain, nonspecific Dyspnea on exertion (DAISY)
Cardiac/Coronary CTA
TECHNIQUE: The patient was scanned on a Siemens Force scanner.
PROTOCOL: A 120 kV prospective scan was triggered in the descending thoracic
aorta at 111 HU's. Axial non-contrast 3 mm slices were carried out
through the heart. The data set was analyzed on a dedicated work
station and scored using the Agatson method. Gantry rotation speed
was 250 msecs and collimation was .6 mm. Beta blockade and 0.8 mg of
sl NTG was given. The 3D data set was reconstructed in 5% intervals
of the 67-82 % of the R-R cycle. Diastolic phases were analyzed on a
dedicated work station using MPR, MIP and VRT modes. The patient
received 100mL OMNIPAQUE IOHEXOL 350 MG/ML SOLN of contrast.

[Series 6: best diast 79 % · axial · 0.39mm/px · z∈[-268,-225]mm · 2 of 323 slices shown, 3 images]
[im 108/323  vessel]
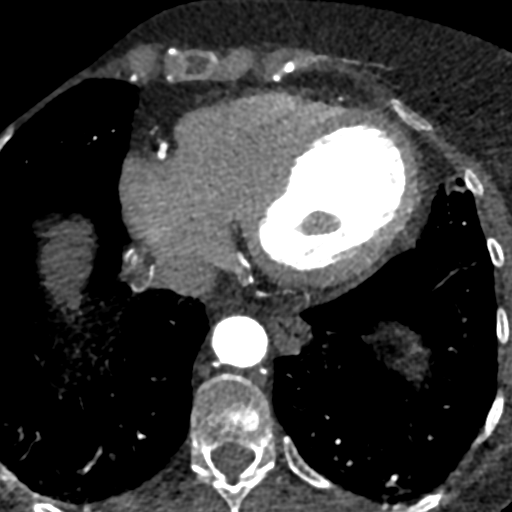
[im 108/323  lung]
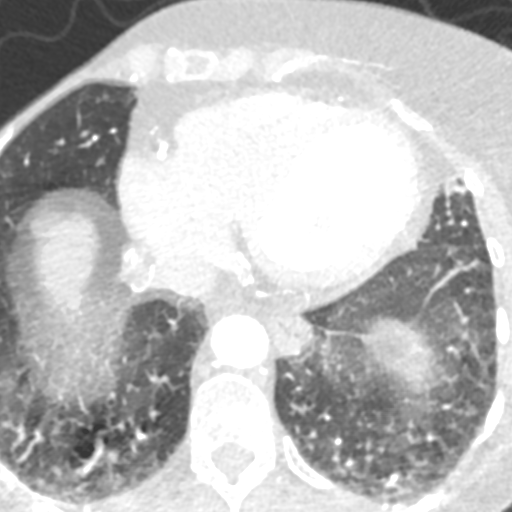
[im 215/323  vessel]
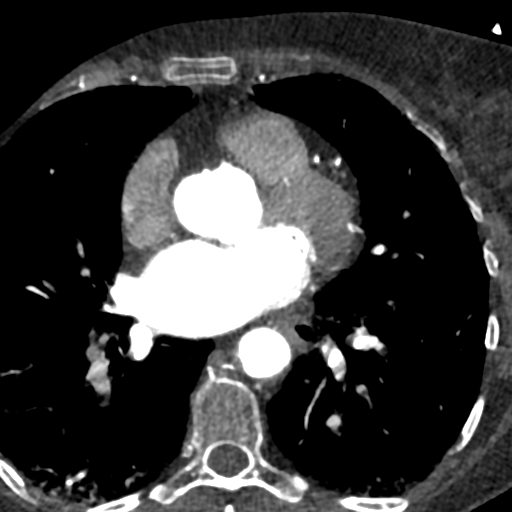

[Series 7: best syst 43 % · axial · 0.39mm/px · z∈[-268,-225]mm · 2 of 323 slices shown]
[im 108/323  vessel]
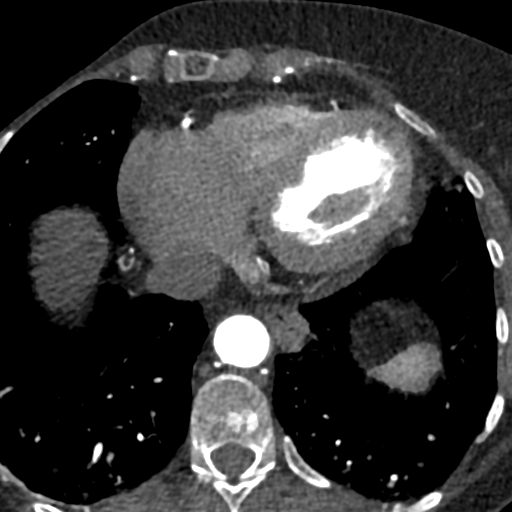
[im 215/323  vessel]
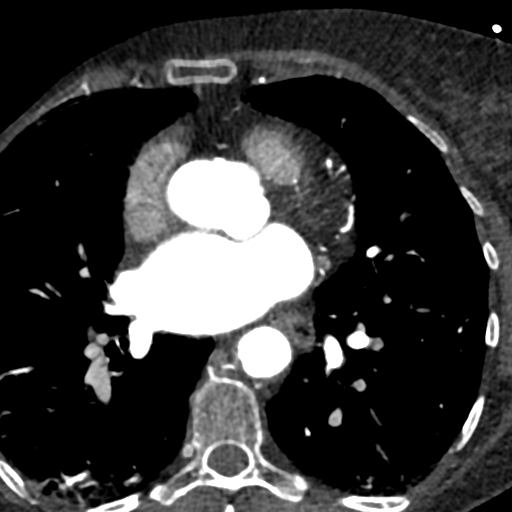

[Series 8: ts diast sharp 43 % · axial · 0.39mm/px · z∈[-268,-225]mm · 2 of 323 slices shown]
[im 108/323  lung]
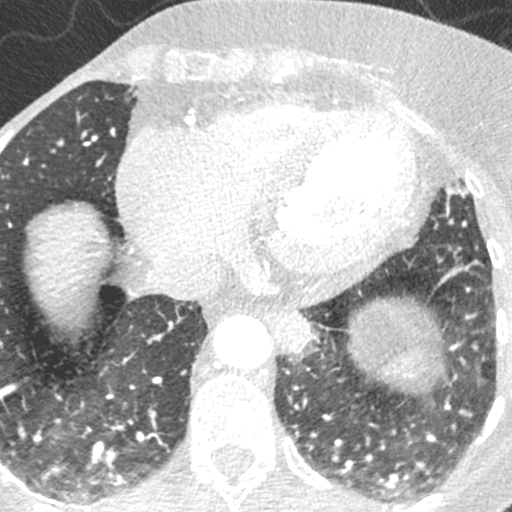
[im 215/323  lung]
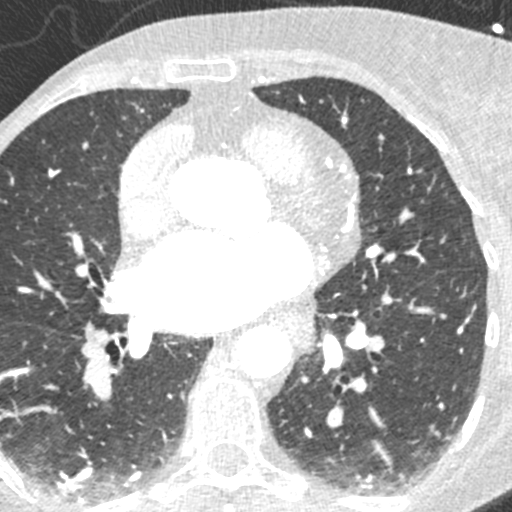

[Series 9: ts syst sharp 43 % · axial · 0.39mm/px · z∈[-268,-225]mm · 2 of 323 slices shown]
[im 108/323  lung]
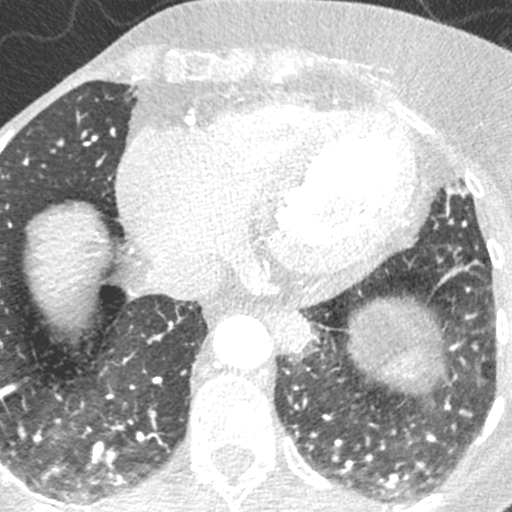
[im 215/323  lung]
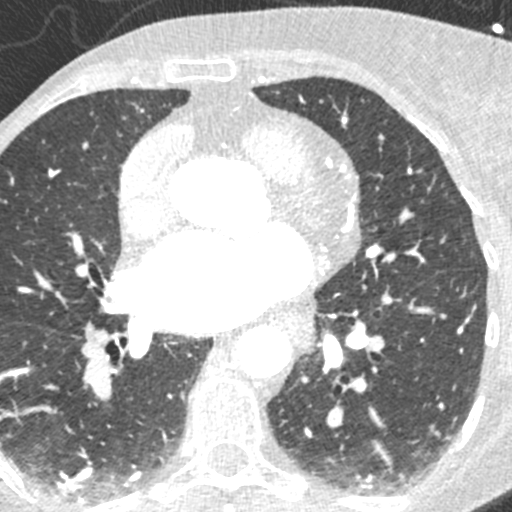

[8 of 20 positions shown; findings below may reference images not displayed]

FINDINGS: Vascular: Heart is normal size. Visualized aorta normal caliber.
Scattered calcifications in the aortic root and visualized
descending thoracic aorta.

Mediastinum/Nodes: No adenopathy in the lower mediastinum or hila.

Lungs/Pleura: No confluent opacities or effusions.

Upper Abdomen: Imaging into the upper abdomen shows no acute
findings.

Musculoskeletal: Prior right mastectomy.  No acute bony abnormality.
IMPRESSION: No acute extra cardiac abnormality.

Aortic atherosclerosis.

Prior right mastectomy.
FINDINGS: Quality: Good (HR 61), high contrast bolus

Coronary calcium score: [name] coronary artery calcium score
is 0, which places the patient in the 0 percentile.

Coronary arteries: Normal coronary origins.  Right dominance.

Right Coronary Artery: Dominant artery.  No significant obstruction.

Left Main Coronary Artery: Normal vessel. Gives off LAD and LCx
arteries.

Left Anterior Descending Coronary Artery: Anterior course, reaches
the apex with a single moderate-sized diagonal branch. No
significant obstruction.

Left Circumflex Artery: AV groove vessel, no disease. Large OM1
branch without disease.

Aorta: Normal size, 31 mm at the mid ascending aorta (level of the
PA bifurcation) measured double oblique. Aortic atherosclerosis. No
dissection.

Aortic Valve: Trileaflet.  Aortic and mitral annular calcification.

Other findings:

Normal pulmonary vein drainage into the left atrium.

Normal left atrial appendage without a thrombus.

Normal size of the pulmonary artery.
IMPRESSION: 1. No evidence of CAD, CADRADS = 0.

2. Coronary calcium score of 0. This was 0 percentile for age and
sex matched control.

3. Aortic atherosclerosis with aortic and mitral valve annular
calcification.

4. Normal coronary origin with right dominance.

*** End of Addendum ***
EXAM:
OVER-READ INTERPRETATION  CT CHEST

The following report is an over-read performed by radiologist Dr.
DAISY [REDACTED] on [DATE]. This over-read
does not include interpretation of cardiac or coronary anatomy or
pathology. The coronary CTA interpretation by the cardiologist is
attached.
FINDINGS: Vascular: Heart is normal size. Visualized aorta normal caliber.
Scattered calcifications in the aortic root and visualized
descending thoracic aorta.

Mediastinum/Nodes: No adenopathy in the lower mediastinum or hila.

Lungs/Pleura: No confluent opacities or effusions.

Upper Abdomen: Imaging into the upper abdomen shows no acute
findings.

Musculoskeletal: Prior right mastectomy.  No acute bony abnormality.
IMPRESSION: No acute extra cardiac abnormality.

Aortic atherosclerosis.

Prior right mastectomy.

## 2019-09-29 MED ORDER — IOHEXOL 350 MG/ML SOLN
100.0000 mL | Freq: Once | INTRAVENOUS | Status: AC | PRN
  Administered 2019-09-29: 100 mL via INTRAVENOUS

## 2019-09-29 MED ORDER — NITROGLYCERIN 0.4 MG SL SUBL
0.8000 mg | SUBLINGUAL_TABLET | Freq: Once | SUBLINGUAL | Status: AC
  Administered 2019-09-29: 0.8 mg via SUBLINGUAL

## 2019-09-29 MED ORDER — NITROGLYCERIN 0.4 MG SL SUBL
SUBLINGUAL_TABLET | SUBLINGUAL | Status: AC
  Filled 2019-09-29: qty 2

## 2019-09-29 NOTE — Telephone Encounter (Signed)
[id number]. To notify pt that a GI referral had been placed.

## 2019-09-30 ENCOUNTER — Telehealth: Payer: Self-pay | Admitting: Cardiovascular Disease

## 2019-09-30 NOTE — Telephone Encounter (Signed)
Follow Up:      Returning Cheyenne Sanders's call from yesterday.

## 2019-09-30 NOTE — Telephone Encounter (Signed)
[name]., PA-C  09/30/2019 7:52 AM EDT    Please notify the patient that the CT scan of the arteries around her heart looks great! There was no evidence of plaque buildup. Only some mild calcifications noted on her aortic and mitral valve which does not affect how the valves function and does not require any further work-up/management. A very reassuring study! Thank you!     Spoke with the pt and informed her that I do not see a note from [place], where she left a message for her to call back. Informed the pt that she actually spoke with Clarise Cruz on 3/16, the day before her CT to go over any questions or concerns about upcoming Coronary CT.  Informed the pt that possibly Krista's CMA  Terrah left her a message to endorse to her, her Coronary CT results.  Endorsed to the pt her CTA results per Roby Lofts, PA-C.  Pt is requesting a copy of this report to be mailed to her home address. Informed the pt that I will have [facility] mail her a copy of this report, being she has been made aware of this result. Pt verbalized understanding and agrees with this plan.

## 2019-09-30 NOTE — Telephone Encounter (Signed)
Called and spoke with pt. Notified that we had placed the GI referral due to her complaints last week that her pantoprazole was not working.  Pt verbalized understanding. She also stated that she had her Cornary CT yesterday and she was told that it looked good. She is still having the SOB and wondering where it is coming from if it isnt her heart.  Will forward to Dr.Kelly for review.

## 2019-10-01 ENCOUNTER — Telehealth: Payer: Self-pay

## 2019-10-01 ENCOUNTER — Telehealth: Payer: Self-pay | Admitting: Family Medicine

## 2019-10-01 ENCOUNTER — Encounter: Payer: Self-pay | Admitting: Gastroenterology

## 2019-10-01 NOTE — Telephone Encounter (Signed)
Message sent to doctor for this pt. She is aware that pcp will be out of the office until nx wk.

## 2019-10-01 NOTE — Telephone Encounter (Signed)
Check message from the 12th. Pt was seen by [place] on the 16th. Pt is done with her antibiotic. She feels like the pain is back on the way if she lays on her  left side she feels the pain is coming back. She thinks she may need an ultasound of her lungs. She has had several xrays of her lungs and a cat scan of her heart. Please advise pt at [phone].

## 2019-10-05 ENCOUNTER — Encounter: Payer: Self-pay | Admitting: Family Medicine

## 2019-10-05 ENCOUNTER — Other Ambulatory Visit: Payer: Self-pay

## 2019-10-05 ENCOUNTER — Ambulatory Visit (INDEPENDENT_AMBULATORY_CARE_PROVIDER_SITE_OTHER): Payer: Medicare Other | Admitting: Family Medicine

## 2019-10-05 VITALS — BP 132/69 | HR 65 | Temp 97.6°F | Resp 18 | Ht 65.0 in | Wt 126.0 lb

## 2019-10-05 DIAGNOSIS — K219 Gastro-esophageal reflux disease without esophagitis: Secondary | ICD-10-CM

## 2019-10-05 DIAGNOSIS — Z853 Personal history of malignant neoplasm of breast: Secondary | ICD-10-CM

## 2019-10-05 DIAGNOSIS — Z1231 Encounter for screening mammogram for malignant neoplasm of breast: Secondary | ICD-10-CM

## 2019-10-05 MED ORDER — PANTOPRAZOLE SODIUM 40 MG PO TBEC: 40.0000 mg | DELAYED_RELEASE_TABLET | Freq: Every day | ORAL | 2 refills | Status: DC

## 2019-10-05 NOTE — Progress Notes (Signed)
3/23/20218:20 AM  Cheyenne Sanders 1950-12-24, 69 y.o., female [id number]  Chief Complaint  Patient presents with  . left side under breast discomfort    mainly at night. had CT done at [facility]    HPI:   Patient is a 69 y.o. female with past medical history significant for HTN, PVCs, HLP, kidney stones, breast cancer  who presents today for left side chest pain  Last OV a week ago for fu on LLL PNA, since then had CT calcium score of 0, normal lungs, no lymphadenopathy Last mammo feb 2019, s/p right mastectomy in 2014  Patient reports that her left chest pain is getting better Worse at night when she lies down  A/w SOB and reflux, occ nausea Denies any burning abd pain, vomiting, black tarry stools She stopped taking PPI which was started by cards as she was instructed to do so by ER provider Overall she is feeling better and recovering well from her PNA Denies fever or chills, denies cough   Depression screen [facility] 2/9 10/05/2019 09/28/2019 08/17/2019  Decreased Interest 0 0 0  Down, Depressed, Hopeless 0 0 0  PHQ - 2 Score 0 0 0    Fall Risk  10/05/2019 09/28/2019 08/17/2019 06/03/2019 02/10/2019  Falls in the past year? 0 0 0 0 0  Number falls in past yr: 0 - 0 0 0  Injury with Fall? 0 - 0 0 0  Follow up Falls evaluation completed Falls evaluation completed - - Falls evaluation completed     Allergies  Allergen Reactions  . Fentanyl Shortness Of Breath    Prior to Admission medications   Medication Sig Start Date End Date Taking? Authorizing Provider  aspirin 325 MG tablet Take 325 mg by mouth as needed.   Yes [provider]  Cholecalciferol (D3-1000) 25 MCG (1000 UT) capsule Take 1,000 Units by mouth daily.   Yes [provider]  metoprolol succinate (TOPROL-XL) 25 MG 24 hr tablet Take 1 tablet (25 mg total) by mouth daily. Take with or immediately following a meal. 09/22/19 12/21/19 Yes Kroeger, Lorelee Cover., PA-C  metoprolol tartrate (LOPRESSOR) 25 MG  tablet Take 1 tablet (25 mg total) by mouth as needed. 08/03/19 11/01/19 Yes Troy Sine, MD  Multiple Vitamins-Minerals (PRESERVISION AREDS 2 PO) Take by mouth.   Yes [provider]  pantoprazole (PROTONIX) 40 MG tablet Take 1 tablet (40 mg total) by mouth daily. Patient not taking: Reported on 10/05/2019 08/30/19   [name]., PA-C  rosuvastatin (CRESTOR) 20 MG tablet Take 1 tablet (20 mg total) by mouth daily. Patient not taking: Reported on 10/05/2019 08/03/19 11/01/19  Troy Sine, MD    Past Medical History:  Diagnosis Date  . Breast cancer ([place]) [id number]   R breast cancer x 2; lumpectomy with radiation, chemotherapy; mastectomy R.  . Nephrolithiasis   . Personal history of chemotherapy   . Personal history of radiation therapy     Past Surgical History:  Procedure Laterality Date  . ABDOMINAL HYSTERECTOMY  1986   DUB; ovaries intact.  [facility] Kitchen BREAST BIOPSY Left 2016  . BREAST SURGERY Right 2014  . cardiac stenting  07/15/1989  . MASTECTOMY Right    2014    Social History   Tobacco Use  . Smoking status: Never Smoker  . Smokeless tobacco: Never Used  Substance Use Topics  . Alcohol use: Yes    Alcohol/week: 1.0 standard drinks    Types: 1 Glasses of wine per week  Family History  Problem Relation Age of Onset  . Heart disease Mother   . Cancer Father   . Breast cancer Neg Hx     ROS Per hpi  OBJECTIVE:  Today's Vitals   10/05/19 0812  BP: 132/69  Pulse: 65  Resp: 18  Temp: 97.6 F (36.4 C)  TempSrc: Temporal  SpO2: 98%  Weight: 126 lb (57.2 kg)  Height: 5\' 5"  (1.651 m)   Body mass index is 20.97 kg/m.   Physical Exam Vitals and nursing note reviewed.  Constitutional:      Appearance: She is well-developed.  HENT:     Head: Normocephalic and atraumatic.  Eyes:     General: No scleral icterus.    Conjunctiva/sclera: Conjunctivae normal.     Pupils: Pupils are equal, round, and reactive to light.  Pulmonary:     Effort:  Pulmonary effort is normal.  Musculoskeletal:     Cervical back: Neck supple.  Skin:    General: Skin is warm and dry.  Neurological:     Mental Status: She is alert and oriented to person, place, and time.     No results found for this or any previous visit (from the past 24 hour(s)).  No results found.   ASSESSMENT and PLAN  1. Gastroesophageal reflux disease without esophagitis Discussed dietary and LFM, new meds r/se/b and RTC precautions. Patient educational handout given.  2. Visit for screening mammogram 3. Personal history of breast cancer - MM Digital Diagnostic Unilat L; Future  Other orders - pantoprazole (PROTONIX) 40 MG tablet; Take 1 tablet (40 mg total) by mouth at bedtime.  Return for as scheduled.    Rutherford Guys, MD [facility] at [facility] [place], [place] [address] Ph.  [phone] Fax [phone]

## 2019-10-05 NOTE — Patient Instructions (Addendum)
If you have lab work done today you will be contacted with your lab results within the next 2 weeks.  If you have not heard from [place] then please contact us. The fastest way to get your results is to register for My Chart.   IF you received an x-ray today, you will receive an invoice from [facility]. Please contact [facility] at [phone] with questions or concerns regarding your invoice.   IF you received labwork today, you will receive an invoice from [facility]. Please contact [facility] at [phone] with questions or concerns regarding your invoice.   Our billing staff will not be able to assist you with questions regarding bills from these companies.  You will be contacted with the lab results as soon as they are available. The fastest way to get your results is to activate your My Chart account. Instructions are located on the last page of this paperwork. If you have not heard from [place] regarding the results in 2 weeks, please contact this office.      Food Choices for Gastroesophageal Reflux Disease, Adult When you have gastroesophageal reflux disease (GERD), the foods you eat and your eating habits are very important. Choosing the right foods can help ease the discomfort of GERD. Consider working with a diet and nutrition specialist (dietitian) to help you make healthy food choices. What general guidelines should I follow?  Eating plan  Choose healthy foods low in fat, such as fruits, vegetables, whole grains, low-fat dairy products, and lean meat, fish, and poultry.  Eat frequent, small meals instead of three large meals each day. Eat your meals slowly, in a relaxed setting. Avoid bending over or lying down until 2-3 hours after eating.  Limit high-fat foods such as fatty meats or fried foods.  Limit your intake of oils, butter, and shortening to less than 8 teaspoons each day.  Avoid the following: ? Foods that cause symptoms. These may be different  for different people. Keep a food diary to keep track of foods that cause symptoms. ? Alcohol. ? Drinking large amounts of liquid with meals. ? Eating meals during the 2-3 hours before bed.  Cook foods using methods other than frying. This may include baking, grilling, or broiling. Lifestyle  Maintain a healthy weight. Ask your health care provider what weight is healthy for you. If you need to lose weight, work with your health care provider to do so safely.  Exercise for at least 30 minutes on 5 or more days each week, or as told by your health care provider.  Avoid wearing clothes that fit tightly around your waist and chest.  Do not use any products that contain nicotine or tobacco, such as cigarettes and e-cigarettes. If you need help quitting, ask your health care provider.  Sleep with the head of your bed raised. Use a wedge under the mattress or blocks under the bed frame to raise the head of the bed. What foods are not recommended? The items listed may not be a complete list. Talk with your dietitian about what dietary choices are best for you. Grains Pastries or quick breads with added fat. [place] toast. Vegetables Deep fried vegetables. [place] fries. Any vegetables prepared with added fat. Any vegetables that cause symptoms. For some people this may include tomatoes and tomato products, chili peppers, onions and garlic, and horseradish. Fruits Any fruits prepared with added fat. Any fruits that cause symptoms. For some people this may include citrus fruits, such as  oranges, grapefruit, pineapple, and lemons. Meats and other protein foods High-fat meats, such as fatty beef or pork, hot dogs, ribs, ham, sausage, salami and bacon. Fried meat or protein, including fried fish and fried chicken. Nuts and nut butters. Dairy Whole milk and chocolate milk. Sour cream. Cream. Ice cream. Cream cheese. Milk shakes. Beverages Coffee and tea, with or without caffeine. Carbonated beverages.  Sodas. Energy drinks. Fruit juice made with acidic fruits (such as orange or grapefruit). Tomato juice. Alcoholic drinks. Fats and oils Butter. Margarine. Shortening. Ghee. Sweets and desserts Chocolate and cocoa. Donuts. Seasoning and other foods Pepper. Peppermint and spearmint. Any condiments, herbs, or seasonings that cause symptoms. For some people, this may include curry, hot sauce, or vinegar-based salad dressings. Summary  When you have gastroesophageal reflux disease (GERD), food and lifestyle choices are very important to help ease the discomfort of GERD.  Eat frequent, small meals instead of three large meals each day. Eat your meals slowly, in a relaxed setting. Avoid bending over or lying down until 2-3 hours after eating.  Limit high-fat foods such as fatty meat or fried foods. This information is not intended to replace advice given to you by your health care provider. Make sure you discuss any questions you have with your health care provider. Document Revised: 10/22/2018 Document Reviewed: 07/02/2016 Elsevier Patient Education  [place].

## 2019-10-05 NOTE — Telephone Encounter (Signed)
Patient's concern/request has been addressed 

## 2019-10-07 NOTE — Telephone Encounter (Signed)
Follow up  Patient is calling in to follow up about the CT scan results. States that she wants to know if she needs to continue to take the blood pressure medication since her results were good. Please give patient a call back to advise.

## 2019-10-07 NOTE — Telephone Encounter (Signed)
Spoke to patient, advised to continue current medications and discussed importance of continued blood pressure control.   Patient verbalized understanding.   OV with Dr. Claiborne Billings 4/16

## 2019-10-14 ENCOUNTER — Ambulatory Visit (INDEPENDENT_AMBULATORY_CARE_PROVIDER_SITE_OTHER): Payer: Self-pay | Admitting: *Deleted

## 2019-10-14 ENCOUNTER — Other Ambulatory Visit: Payer: Self-pay

## 2019-10-14 DIAGNOSIS — L603 Nail dystrophy: Secondary | ICD-10-CM

## 2019-10-14 DIAGNOSIS — B351 Tinea unguium: Secondary | ICD-10-CM

## 2019-10-14 NOTE — Progress Notes (Signed)
Patient presents today for the 2nd laser treatment. Diagnosed with mycotic nail infection by Dr. Milinda Pointer. Toenail most affected hallux left.  All other systems are negative.  Nails were filed thin. Laser therapy was administered to 1st toenails left and patient tolerated the treatment well. All safety precautions were in place.    Follow up in 4 weeks for laser # 3.

## 2019-10-15 NOTE — Telephone Encounter (Signed)
ok 

## 2019-10-15 NOTE — Telephone Encounter (Signed)
Spoke with pt, states that she is still having some lingering SOB. Her Coronary CT was reviewed with a prior nurse. Notified that Dr.Kelly would discuss SOB and any other issue in her upcoming office visit on 10/29/19 at 8:20 am. Pt verbalized understanding with no other questions at that time. Will make MD aware.

## 2019-10-26 ENCOUNTER — Ambulatory Visit: Payer: Medicare Other

## 2019-10-28 ENCOUNTER — Other Ambulatory Visit: Payer: Self-pay | Admitting: Cardiovascular Disease

## 2019-10-29 ENCOUNTER — Ambulatory Visit: Payer: Medicare Other | Admitting: Cardiovascular Disease

## 2019-11-10 ENCOUNTER — Ambulatory Visit: Payer: Medicare Other | Admitting: Gastroenterology

## 2019-11-12 ENCOUNTER — Ambulatory Visit: Payer: Medicare Other | Admitting: Pulmonary Disease

## 2019-11-12 ENCOUNTER — Other Ambulatory Visit: Payer: Medicare Other

## 2019-11-12 ENCOUNTER — Other Ambulatory Visit: Payer: Self-pay

## 2019-11-12 ENCOUNTER — Encounter: Payer: Self-pay | Admitting: Pulmonary Disease

## 2019-11-12 VITALS — BP 124/56 | HR 47 | Temp 97.1°F | Ht 65.0 in | Wt 126.4 lb

## 2019-11-12 DIAGNOSIS — R0600 Dyspnea, unspecified: Secondary | ICD-10-CM

## 2019-11-12 NOTE — Patient Instructions (Signed)
We are going to schedule breathing tests.  I think that your shortness of breath is likely related to deconditioning.  I recommend that you take the Covid shot(s).  We will see you after the breathing tests are done.

## 2019-11-12 NOTE — Progress Notes (Signed)
    Assessment & Plan:  1. Dyspnea, unspecified type (Primary)   Patient Instructions  We are going to schedule breathing tests.  I think that your shortness of breath is likely related to deconditioning.  I recommend that you take the Covid shot(s).  We will see you after the breathing tests are done.  Please note: late entry documentation due to logistical difficulties during COVID-19 pandemic. This note is filed for information purposes only, and is not intended to be used for billing, nor does it represent the full scope/nature of the visit in question. Please see any associated scanned media linked to date of encounter for additional pertinent information.  Subjective:    HPI: Cheyenne Sanders is a 69 y.o. female presenting to the pulmonology clinic on 11/12/2019 with report of: Consult (Pain on left side when laying down, SOB with activity)   Patient is a 69 year old lifelong never smoker who presents for evaluation of episode of shortness of breath.  She is self-referred.  Her primary provider is Dr. Mikel Melonie Colonel.  The patient states that a month ago around 11 March, she developed issues with left-sided chest pain that was pleuritic in nature.  She was evaluated in the emergency room and diagnosed with pneumonia with a small pleural effusion.  She was given prednisone  and doxycycline .  The patient states that the symptoms started  Outpatient Encounter Medications as of 11/12/2019  Medication Sig Note   [DISCONTINUED] aspirin 325 MG tablet Take 325 mg by mouth as needed. 12/16/2019: On hand   [DISCONTINUED] metoprolol  succinate (TOPROL -XL) 25 MG 24 hr tablet Take 1 tablet (25 mg total) by mouth daily. Take with or immediately following a meal. 09/23/2019: Not taking yet   [DISCONTINUED] metoprolol  tartrate (LOPRESSOR ) 25 MG tablet Take 1 tablet (25 mg total) by mouth as needed.    [DISCONTINUED] Multiple Vitamins-Minerals (PRESERVISION AREDS 2 PO) Take by mouth.     [DISCONTINUED] Cholecalciferol (D3-1000) 25 MCG (1000 UT) capsule Take 1,000 Units by mouth daily.    [DISCONTINUED] pantoprazole  (PROTONIX ) 40 MG tablet Take 1 tablet (40 mg total) by mouth at bedtime. (Patient not taking: Reported on 11/12/2019)    [DISCONTINUED] rosuvastatin  (CRESTOR ) 20 MG tablet Take 1 tablet (20 mg total) by mouth daily. (Patient not taking: Reported on 10/05/2019) 09/23/2019: Not taking yet   No facility-administered encounter medications on file as of 11/12/2019.      Objective:   Vitals:   11/12/19 0840  BP: (!) 124/56  Pulse: (!) 47  Temp: (!) 97.1 F (36.2 C)  Height: 5' 5 (1.651 m)  Weight: 126 lb 6.4 oz (57.3 kg)  SpO2: 99%  TempSrc: Temporal  BMI (Calculated): 21.03     Physical exam documentation is limited by delayed entry of information.

## 2019-11-16 ENCOUNTER — Telehealth: Payer: Self-pay | Admitting: Family Medicine

## 2019-11-16 NOTE — Telephone Encounter (Signed)
Pt called and is wanting provider to give her a call on which Covid Vaccine she should get. [phone]  Please advise.

## 2019-11-18 DIAGNOSIS — Z23 Encounter for immunization: Secondary | ICD-10-CM | POA: Diagnosis not present

## 2019-11-22 ENCOUNTER — Other Ambulatory Visit: Payer: Medicare Other

## 2019-11-22 ENCOUNTER — Other Ambulatory Visit: Payer: Self-pay | Admitting: Medical

## 2019-11-23 NOTE — Telephone Encounter (Signed)
This is Dr. Kelly's pt. °

## 2019-11-29 ENCOUNTER — Other Ambulatory Visit: Payer: Self-pay

## 2019-11-29 ENCOUNTER — Ambulatory Visit (INDEPENDENT_AMBULATORY_CARE_PROVIDER_SITE_OTHER): Payer: Self-pay | Admitting: *Deleted

## 2019-11-29 DIAGNOSIS — L603 Nail dystrophy: Secondary | ICD-10-CM

## 2019-11-29 DIAGNOSIS — B351 Tinea unguium: Secondary | ICD-10-CM

## 2019-11-29 NOTE — Progress Notes (Signed)
Patient presents today for the 3rd laser treatment. Diagnosed with mycotic nail infection by Dr. Milinda Pointer.  Toenail most affected hallux left. There is a slight bit of regrowth showing and it is growing out clear.  All other systems are negative.  Nails were filed thin. Laser therapy was administered to 1st toenails left and patient tolerated the treatment well. All safety precautions were in place.    Follow up in 4 weeks for laser # 4.

## 2019-12-10 DIAGNOSIS — Z23 Encounter for immunization: Secondary | ICD-10-CM | POA: Diagnosis not present

## 2019-12-15 ENCOUNTER — Other Ambulatory Visit: Payer: Self-pay

## 2019-12-15 ENCOUNTER — Ambulatory Visit (INDEPENDENT_AMBULATORY_CARE_PROVIDER_SITE_OTHER): Payer: Medicare Other | Admitting: Dermatology

## 2019-12-15 DIAGNOSIS — D2372 Other benign neoplasm of skin of left lower limb, including hip: Secondary | ICD-10-CM | POA: Diagnosis not present

## 2019-12-15 DIAGNOSIS — L821 Other seborrheic keratosis: Secondary | ICD-10-CM

## 2019-12-15 DIAGNOSIS — L720 Epidermal cyst: Secondary | ICD-10-CM

## 2019-12-15 DIAGNOSIS — D239 Other benign neoplasm of skin, unspecified: Secondary | ICD-10-CM

## 2019-12-15 DIAGNOSIS — D2371 Other benign neoplasm of skin of right lower limb, including hip: Secondary | ICD-10-CM

## 2019-12-15 DIAGNOSIS — L820 Inflamed seborrheic keratosis: Secondary | ICD-10-CM

## 2019-12-15 DIAGNOSIS — S40862A Insect bite (nonvenomous) of left upper arm, initial encounter: Secondary | ICD-10-CM

## 2019-12-15 DIAGNOSIS — Z872 Personal history of diseases of the skin and subcutaneous tissue: Secondary | ICD-10-CM

## 2019-12-15 DIAGNOSIS — W57XXXA Bitten or stung by nonvenomous insect and other nonvenomous arthropods, initial encounter: Secondary | ICD-10-CM

## 2019-12-15 DIAGNOSIS — D1801 Hemangioma of skin and subcutaneous tissue: Secondary | ICD-10-CM

## 2019-12-15 NOTE — Progress Notes (Signed)
   Follow-Up Visit   Subjective  Cheyenne Sanders is a 69 y.o. female who presents for the following: lesion (R med canthus - has been there for about 3 weeks and the patient has been using warm compresses but lesion hasn't resolved). She has noticed other lesions that she would like checked on the L arm and L inguinal crease, which gets irritated. Patient also c/o a persistent rash on her R foot that was present last year and flares during the winter months.  She had a tick bite on her L arm month ago that hasn't healed.  The following portions of the chart were reviewed this encounter and updated as appropriate:     Review of Systems:  No other skin or systemic complaints except as noted in HPI or Assessment and Plan.  Objective  Well appearing patient in no apparent distress; mood and affect are within normal limits.  A focused examination was performed including the trunk, extremities, and face. Relevant physical exam findings are noted in the Assessment and Plan.  Objective  R foot dorsum 3rd web space: Indistinct light pink slightly firm papule  L lower leg: Firm pink/brown papulenodule with dimple sign.   R lower leg: Firm pink/brown papulenodule with dimple sign.   Objective  R med canthus: White papule   Objective  B/L arms: Clear- patient says she gets poison ivy every summer and wants an OTC recommendation for it.  Objective  L upper ant thigh: Erythematous keratotic or waxy stuck-on papule.  Objective  L upper inner arm: Pink firm papule   Assessment & Plan  Dermatofibroma (3) R foot dorsum 3rd web space; L lower leg; R lower leg  Benign, observe.  Early on R foot dorsum- recommend Vanicream with HC cream daily PRN itch  Epidermal inclusion cyst R med canthus  Benign. Discussed extraction if bothersome Cyst may resolve on its own without treatment. Continue warm compresses daily.  History of allergic contact dermatitis B/L arms  Recommend OTC HC  cream PRN rash and/or OTC [place]'s compresses.  If too irritating/itchy, patient to RTC for RX topical steroids.  Inflamed seborrheic keratosis L upper ant thigh  Destruction of lesion - L upper ant thigh  Destruction method: cryotherapy   Informed consent: discussed and consent obtained   Lesion destroyed using liquid nitrogen: Yes   Region frozen until ice ball extended beyond lesion: Yes   Outcome: patient tolerated procedure well with no complications   Post-procedure details: wound care instructions given    Tick bite, initial encounter L upper inner arm  Recommend OTC HC cream PRN itch  Discussed tick bites can take a long time to resolve   Seborrheic Keratoses - Stuck-on, waxy, tan-brown papules and plaques  - Discussed benign etiology and prognosis. - Observe - Call for any changes  Hemangiomas - Red papules - Discussed benign nature - Observe - Call for any changes  Return if symptoms worsen or fail to improve.  Luther Redo, CMA, am acting as scribe for Brendolyn Patty, MD .  Documentation: I have reviewed the above documentation for accuracy and completeness, and I agree with the above.  Brendolyn Patty MD

## 2019-12-15 NOTE — Patient Instructions (Signed)
Poison Ivy Dermatitis Poison ivy dermatitis is redness and soreness of the skin caused by chemicals in the leaves of the poison ivy plant. You may have very bad itching, swelling, a rash, and blisters. What are the causes?  Touching a poison ivy plant.  Touching something that has the chemical on it. This may include animals or objects that have come in contact with the plant. What increases the risk?  Going outdoors often in wooded or marshy areas.  Going outdoors without wearing protective clothing, such as closed shoes, long pants, and a long-sleeved shirt. What are the signs or symptoms?   Skin redness.  Very bad itching.  A rash that often includes bumps and blisters. ? The rash usually appears 48 hours after exposure, if you have been exposed before. ? If this is the first time you have been exposed, the rash may not appear until a week after exposure.  Swelling. This may occur if the reaction is very bad. Symptoms usually last for 1-2 weeks. The first time you develop this condition, symptoms may last 3-4 weeks. How is this treated? This condition may be treated with:  Hydrocortisone cream or calamine lotion to relieve itching.  Oatmeal baths to soothe the skin.  Medicines, such as over-the-counter antihistamine tablets.  Oral steroid medicine for more severe reactions. Follow these instructions at home: Medicines  Take or apply over-the-counter and prescription medicines only as told by your doctor.  Use hydrocortisone cream or calamine lotion as needed to help with itching. General instructions  Do not scratch or rub your skin.  Put a cold, wet cloth (cold compress) on the affected areas or take baths in cool water. This will help with itching.  Avoid hot baths and showers.  Take oatmeal baths as needed. Use colloidal oatmeal. You can get this at a pharmacy or grocery store. Follow the instructions on the package.  While you have the rash, wash your clothes  right after you wear them.  Keep all follow-up visits as told by your health care provider. This is important. How is this prevented?   Know what poison ivy looks like, so you can avoid it. ? This plant has three leaves with flowering branches on a single stem. ? The leaves are glossy. ? The leaves have uneven edges that come to a point at the front.  If you touch poison ivy, wash your skin with soap and water right away. Be sure to wash under your fingernails.  When hiking or camping, wear long pants, a long-sleeved shirt, tall socks, and hiking boots. You can also use a lotion on your skin that helps to prevent contact with poison ivy.  If you think that your clothes or outdoor gear came in contact with poison ivy, rinse them off with a garden hose before you bring them inside your house.  When doing yard work or gardening, wear gloves, long sleeves, long pants, and boots. Wash your garden tools and gloves if they come in contact with poison ivy.  If you think that your pet has come into contact with poison ivy, wash him or her with pet shampoo and water. Make sure to wear gloves while washing your pet. Contact a doctor if:  You have open sores in the rash area.  You have more redness, swelling, or pain in the rash area.  You have redness that spreads beyond the rash area.  You have fluid, blood, or pus coming from the rash area.  You have a   fever.  You have a rash over a large area of your body.  You have a rash on your eyes, mouth, or genitals.  Your rash does not get better after a few weeks. Get help right away if:  Your face swells or your eyes swell shut.  You have trouble breathing.  You have trouble swallowing. These symptoms may be an emergency. Do not wait to see if the symptoms will go away. Get medical help right away. Call your local emergency services (911 in the [place] Do not drive yourself to the hospital. Summary  Poison ivy dermatitis is redness and  soreness of the skin caused by chemicals in the leaves of the poison ivy plant.  You may have skin redness, very bad itching, swelling, and a rash.  Do not scratch or rub your skin.  Take or apply over-the-counter and prescription medicines only as told by your doctor. This information is not intended to replace advice given to you by your health care provider. Make sure you discuss any questions you have with your health care provider. Document Revised: 10/23/2018 Document Reviewed: 06/26/2018 Elsevier Patient Education  2020 [employer]  

## 2019-12-16 ENCOUNTER — Ambulatory Visit (INDEPENDENT_AMBULATORY_CARE_PROVIDER_SITE_OTHER): Payer: Medicare Other | Admitting: Gastroenterology

## 2019-12-16 ENCOUNTER — Encounter: Payer: Self-pay | Admitting: Gastroenterology

## 2019-12-16 VITALS — BP 130/80 | HR 64 | Ht 63.25 in | Wt 126.4 lb

## 2019-12-16 DIAGNOSIS — Z86010 Personal history of colonic polyps: Secondary | ICD-10-CM | POA: Diagnosis not present

## 2019-12-16 DIAGNOSIS — R0789 Other chest pain: Secondary | ICD-10-CM

## 2019-12-16 DIAGNOSIS — R091 Pleurisy: Secondary | ICD-10-CM

## 2019-12-16 DIAGNOSIS — R151 Fecal smearing: Secondary | ICD-10-CM

## 2019-12-16 MED ORDER — CITRUCEL PO POWD: ORAL | Status: DC

## 2019-12-16 NOTE — Patient Instructions (Addendum)
If you are age 67 or older, your body mass index should be between 23-30. Your Body mass index is 22.21 kg/m. If this is out of the aforementioned range listed, please consider follow up with your Primary Care Provider.  If you are age 61 or younger, your body mass index should be between 19-25. Your Body mass index is 22.21 kg/m. If this is out of the aformentioned range listed, please consider follow up with your Primary Care Provider.   You can take Gaviscon or Maalox over the counter as need for the chest pain.  We will refer you to Pelvic Floor Physical Therapy for fecal soiling.  They will contact you to schedule an appointment.  Please purchase the following medications over the counter and take as directed: Citrucel: Take as directed, Daily  You will be due for a recall colonoscopy in 06-2020. We will send you a reminder in the mail when it gets closer to that time.  Thank you for entrusting me with your care and for choosing [facility], Dr. [place] Cellar

## 2019-12-16 NOTE — Progress Notes (Signed)
HPI :  69 year old female with a history of remote breast cancer, history of colon polyps, history of pneumonia, referred by Shelva Majestic MD for chest pain / fecal soiling.  The patient states she has had intermittent chest discomfort for " a long time", comes and goes.  Symptoms got worse recently.  States she would feel discomfort in her lower mid chest but it could radiate to the sides at times.  She denies any clear triggers to her symptoms, not related to eating, no associated exertional symptoms although she has some baseline dyspnea with exertion which is thought to be due to deconditioning.  Overall these episodes would happen about once per week.  She was seen in the emergency room this past March diagnosed with pneumonia/pleurisy, treated with steroids and doxycycline and states at the time it made her symptoms significantly improved.  She been doing much better since that time and has not really had much chest discomfort.  She denies any reflux symptoms at baseline.  Very rare regurgitation but no heartburn.  No dysphagia.  No nausea or vomiting.  During the time of her symptoms a few months ago she had Protonix for roughly 2 weeks and states it did not really change anything.  She was seen by cardiology and had a CT angiogram in March, roughly 1 week after her initial chest x-ray.  The CTA showed no evidence of coronary artery disease and her lungs actually looked better.  In general she really has not had much discomfort in her chest for the past several weeks.  She has been doing pretty well in this regard.  She has never had a prior upper endoscopy.  Her last colonoscopy was performed in December 2016 in [place], at which time she had one 5 mm sessile serrated adenoma removed.  She states that she does not have any diarrhea or constipation but she routinely can have some leakage of stool from her rectum throughout the day.  She wears pads to protect herself and especially when she is out of  the house.  She has roughly 1 bowel movement a day.  No blood in her stools.  She denies any leakage of urine.  She denies any fiber use or supplements otherwise.   Echo 08/06/19 - normal LV function, mild MR and TR  CTA 09/29/19 - no evidence of CAD No chest pathology noted  Colonoscopy 06/20/15 - 74mm transverse polyp, grade I hemorrhoids - sessile serrated adenoma  Past Medical History:  Diagnosis Date   Breast cancer ([place]) [id number]   R breast cancer x 2; lumpectomy with radiation, chemotherapy; mastectomy R.   Colon polyps    Hyperlipidemia    Hypertension    Nephrolithiasis    Personal history of chemotherapy    Personal history of radiation therapy    Pneumonia      Past Surgical History:  Procedure Laterality Date   BREAST BIOPSY Left 2016   BREAST LUMPECTOMY Right 1999   MASTECTOMY Right    2014   PARTIAL HYSTERECTOMY  1986   DUB; ovaries intact.   Family History  Problem Relation Age of Onset   Heart disease Mother    Colon cancer Maternal Grandfather    Heart attack Maternal Grandfather        or ? stroke   Colon cancer Maternal Aunt    Thyroid disease Son    Breast cancer Neg Hx    Social History   Tobacco Use   Smoking status: Never Smoker  Smokeless tobacco: Never Used  Substance Use Topics   Alcohol use: Yes    Alcohol/week: 1.0 standard drinks    Types: 1 Glasses of wine per week    Comment: 2-3 times a year   Drug use: No   Current Outpatient Medications  Medication Sig Dispense Refill   Cholecalciferol (D3-1000) 25 MCG (1000 UT) capsule Take 1,000 Units by mouth daily.     metoprolol succinate (TOPROL-XL) 25 MG 24 hr tablet Take 1 tablet (25 mg total) by mouth daily. Take with or immediately following a meal. 90 tablet 0   Multiple Vitamins-Minerals (PRESERVISION AREDS 2 PO) Take by mouth.     aspirin 325 MG tablet Take 325 mg by mouth as needed.     metoprolol tartrate (LOPRESSOR) 25 MG tablet Take 1 tablet (25 mg  total) by mouth as needed. (Patient not taking: Reported on 12/15/2019) 90 tablet 2   No current facility-administered medications for this visit.   Allergies  Allergen Reactions   Sublimaze [Fentanyl] Shortness Of Breath     Review of Systems: All systems reviewed and negative except where noted in HPI.    Lab Results  Component Value Date   WBC 9.9 09/23/2019   HGB 14.9 09/23/2019   HCT 45.9 09/23/2019   MCV 95.2 09/23/2019   PLT 255 09/23/2019    Lab Results  Component Value Date   CREATININE 0.67 09/23/2019   BUN 17 09/23/2019   NA 141 09/23/2019   K 4.7 09/23/2019   CL 101 09/23/2019   CO2 27 09/23/2019    Lab Results  Component Value Date   ALT 30 02/25/2019   AST 32 02/25/2019   ALKPHOS 63 02/25/2019   BILITOT 0.5 02/25/2019    Physical Exam: BP 130/80 (BP Location: Left Arm, Patient Position: Sitting, Cuff Size: Normal)    Pulse 64    Ht 5' 3.25" (1.607 m) Comment: height measured without shoes   Wt 126 lb 6 oz (57.3 kg)    BMI 22.21 kg/m  Constitutional: Pleasant,well-developed, female in no acute distress. HEENT: Normocephalic and atraumatic. Conjunctivae are normal. No scleral icterus. Neck supple.  Cardiovascular: Normal rate, regular rhythm.  Pulmonary/chest: Effort normal and breath sounds normal. No wheezing, rales or rhonchi. Abdominal: Soft, nondistended, nontender. . There are no masses palpable.  DRE - [place] standby - normal resting tone, slightly low squeeze pressure, no mass lesions Extremities: no edema Lymphadenopathy: No cervical adenopathy noted. Neurological: Alert and oriented to person place and time. Skin: Skin is warm and dry. No rashes noted. Psychiatric: Normal mood and affect. Behavior is normal.   ASSESSMENT AND PLAN: 69 year old female here for reassessment of the following issues:  Atypical chest pain - longstanding intermittent episodes of chest discomfort.  She had pneumonia and was treated for that and  pleurisy back in March and she states that has significantly improved her symptoms and she is not having much of any discomfort anymore.  She had a follow-up CTA which has shown no evidence of coronary artery disease and her lungs actually look clear on that exam.  During this process at some point she had a trial of Protonix for a few weeks which did not change her symptoms at all.  She denies any baseline reflux symptoms or upper tract symptoms in general.  While its possible reflux could be a component of her symptoms it seems a bit less likely/atypical at this time.  She has been doing much better over the past few  weeks and will monitor for now.  If she has recurrent episodes recommend over-the-counter Gaviscon or Maalox to see if that helps at all initially to abort an episode.  If symptoms persist or recur without clear cause then would consider upper endoscopy but I do not feel that we need to pursue that right now given she has been doing better lately.  She agreed  Fecal soiling - ongoing chronic issue as described. Colonoscopy up to date. She has some slightly low squeeze pressure on DRE, possible this could be contributing.  Recommend trial of pelvic floor PT to see if this will help, also recommend a trial of Citrucel once daily to try to bulk stools and help retain as much as possible.  We will see how she does on this regimen, if it does not help and symptoms persist she should call us for reassessment.  History of colon polyps - due for surveillance colonoscopy December 2021  [place] Cellar, MD [facility]  CC: Troy Sine, MD

## 2019-12-27 ENCOUNTER — Other Ambulatory Visit: Payer: Medicare Other

## 2019-12-31 ENCOUNTER — Ambulatory Visit (INDEPENDENT_AMBULATORY_CARE_PROVIDER_SITE_OTHER): Payer: Medicare Other | Admitting: *Deleted

## 2019-12-31 ENCOUNTER — Other Ambulatory Visit: Payer: Self-pay

## 2019-12-31 DIAGNOSIS — L603 Nail dystrophy: Secondary | ICD-10-CM

## 2019-12-31 DIAGNOSIS — B351 Tinea unguium: Secondary | ICD-10-CM

## 2019-12-31 NOTE — Progress Notes (Signed)
Patient presents today for the 4th laser treatment. Diagnosed with mycotic nail infection by Dr. Milinda Pointer.  Toenail most affected hallux left. The toenail is starting to clear up. The borders of the nail were very short and have taken a little longer to grow out.  All other systems are negative.  Nails were filed thin. Laser therapy was administered to 1st toenails left and patient tolerated the treatment well. All safety precautions were in place.    Follow up in 6 weeks for laser # 5 to allow for a little extra nail growth.

## 2020-01-11 ENCOUNTER — Ambulatory Visit: Payer: Medicare Other | Admitting: Physical Therapy

## 2020-01-21 DIAGNOSIS — R102 Pelvic and perineal pain: Secondary | ICD-10-CM | POA: Diagnosis not present

## 2020-02-08 DIAGNOSIS — R140 Abdominal distension (gaseous): Secondary | ICD-10-CM | POA: Diagnosis not present

## 2020-02-08 DIAGNOSIS — R159 Full incontinence of feces: Secondary | ICD-10-CM | POA: Diagnosis not present

## 2020-02-08 DIAGNOSIS — Z124 Encounter for screening for malignant neoplasm of cervix: Secondary | ICD-10-CM | POA: Diagnosis not present

## 2020-02-08 DIAGNOSIS — Z853 Personal history of malignant neoplasm of breast: Secondary | ICD-10-CM | POA: Diagnosis not present

## 2020-02-11 ENCOUNTER — Other Ambulatory Visit: Payer: Self-pay

## 2020-02-11 ENCOUNTER — Ambulatory Visit (INDEPENDENT_AMBULATORY_CARE_PROVIDER_SITE_OTHER): Payer: Medicare Other | Admitting: *Deleted

## 2020-02-11 DIAGNOSIS — B351 Tinea unguium: Secondary | ICD-10-CM

## 2020-02-11 DIAGNOSIS — L603 Nail dystrophy: Secondary | ICD-10-CM

## 2020-02-11 NOTE — Progress Notes (Signed)
Patient presents today for the 5th laser treatment. Diagnosed with mycotic nail infection by Dr. Milinda Pointer.  Toenail most affected hallux left. The nail is looking much clearer, the borders still just slow to grow.  All other systems are negative.  Nails were filed thin. Laser therapy was administered to 1st toenails left and patient tolerated the treatment well. All safety precautions were in place.    Follow up in 8 weeks for laser # 6 to allow for a little extra nail growth.  ~Take final pic next visit~

## 2020-02-15 DIAGNOSIS — H43811 Vitreous degeneration, right eye: Secondary | ICD-10-CM | POA: Diagnosis not present

## 2020-02-15 DIAGNOSIS — H02829 Cysts of unspecified eye, unspecified eyelid: Secondary | ICD-10-CM | POA: Diagnosis not present

## 2020-02-15 DIAGNOSIS — H2513 Age-related nuclear cataract, bilateral: Secondary | ICD-10-CM | POA: Diagnosis not present

## 2020-02-15 DIAGNOSIS — H35372 Puckering of macula, left eye: Secondary | ICD-10-CM | POA: Diagnosis not present

## 2020-02-23 ENCOUNTER — Telehealth: Payer: Self-pay | Admitting: Pulmonary Disease

## 2020-02-23 NOTE — Telephone Encounter (Signed)
Recommend Urgent Care eval or primary care

## 2020-02-23 NOTE — Telephone Encounter (Signed)
Called and spoke to patient.  patient stated that she developed some upper left side back pain yesterday. She is concerned about pleural effusion.   Sob is baseline.  Denied additional symptoms.    Dr. Patsey Berthold, please advise. Thanks

## 2020-02-23 NOTE — Telephone Encounter (Signed)
Patient is aware of below recommendations and voiced her understanding.  Nothing further is needed at this time.

## 2020-02-24 DIAGNOSIS — Z853 Personal history of malignant neoplasm of breast: Secondary | ICD-10-CM | POA: Diagnosis not present

## 2020-02-24 DIAGNOSIS — R140 Abdominal distension (gaseous): Secondary | ICD-10-CM | POA: Diagnosis not present

## 2020-02-29 ENCOUNTER — Ambulatory Visit: Payer: Medicare Other | Admitting: Physical Therapy

## 2020-03-03 ENCOUNTER — Encounter: Payer: Self-pay | Admitting: Family Medicine

## 2020-03-03 ENCOUNTER — Other Ambulatory Visit: Payer: Self-pay

## 2020-03-03 ENCOUNTER — Ambulatory Visit (INDEPENDENT_AMBULATORY_CARE_PROVIDER_SITE_OTHER): Payer: Medicare Other | Admitting: Family Medicine

## 2020-03-03 VITALS — BP 136/80 | HR 82 | Temp 97.6°F | Resp 15 | Ht 63.25 in | Wt 127.2 lb

## 2020-03-03 DIAGNOSIS — B029 Zoster without complications: Secondary | ICD-10-CM

## 2020-03-03 MED ORDER — VALACYCLOVIR HCL 1 G PO TABS: 1000.0000 mg | ORAL_TABLET | Freq: Three times a day (TID) | ORAL | 0 refills | Status: DC

## 2020-03-03 NOTE — Progress Notes (Signed)
8/20/20212:37 PM  Cheyenne Sanders Dec 29, 1950, 69 y.o., female [id number]  Chief Complaint  Patient presents with  . Hair/Scalp Problem    pt has a spot on the Lt side of her head that has been painful and stinging for the last 2 weeks, pt reports touching her hair even hurts, pt has concerns for shingles, pt is unsure if it is raised.     HPI:   Patient is a 69 y.o. female with past medical history significant for HTN, PVCs, HLP, kidney stones, breast cancer who presents today for tender spot on left forehead x 1 week  Patient has noticed sore, burning sensation left side of head, just above hairline that started a week ago She has noticed newer lesions further down Has had chickenpox but has never had shingles No vision changes, red or painful eyes, no photophobia No fever or chills  Depression screen [facility] 2/9 10/05/2019 09/28/2019 08/17/2019  Decreased Interest 0 0 0  Down, Depressed, Hopeless 0 0 0  PHQ - 2 Score 0 0 0    Fall Risk  03/03/2020 10/05/2019 09/28/2019 08/17/2019 06/03/2019  Falls in the past year? 0 0 0 0 0  Number falls in past yr: 0 0 - 0 0  Injury with Fall? 0 0 - 0 0  Risk for fall due to : No Fall Risks - - - -  Follow up Falls evaluation completed Falls evaluation completed Falls evaluation completed - -     Allergies  Allergen Reactions  . Sublimaze [Fentanyl] Shortness Of Breath    Prior to Admission medications   Medication Sig Start Date End Date Taking? Authorizing Provider  aspirin 325 MG tablet Take 325 mg by mouth as needed.   Yes [provider]  Cholecalciferol (D3-1000) 25 MCG (1000 UT) capsule Take 1,000 Units by mouth daily.   Yes [provider]  methylcellulose (CITRUCEL) oral powder Take as directed daily 12/16/19  Yes Armbruster, Carlota Raspberry, MD  metoprolol tartrate (LOPRESSOR) 25 MG tablet Take 1 tablet (25 mg total) by mouth as needed. 10/28/19  Yes Troy Sine, MD  Multiple Vitamins-Minerals (PRESERVISION AREDS 2  PO) Take by mouth.   Yes [provider]    Past Medical History:  Diagnosis Date  . Breast cancer ([facility]) 3149,[id number]   R breast cancer x 2; lumpectomy with radiation, chemotherapy; mastectomy R.  . Colon polyps   . Hyperlipidemia   . Hypertension   . Nephrolithiasis   . Personal history of chemotherapy   . Personal history of radiation therapy   . Pneumonia     Past Surgical History:  Procedure Laterality Date  . BREAST BIOPSY Left 2016  . BREAST LUMPECTOMY Right 1999  . MASTECTOMY Right    2014  . PARTIAL HYSTERECTOMY  1986   DUB; ovaries intact.    Social History   Tobacco Use  . Smoking status: Never Smoker  . Smokeless tobacco: Never Used  Substance Use Topics  . Alcohol use: Yes    Alcohol/week: 1.0 standard drink    Types: 1 Glasses of wine per week    Comment: 2-3 times a year    Family History  Problem Relation Age of Onset  . Heart disease Mother   . Colon cancer Maternal Grandfather   . Heart attack Maternal Grandfather        or ? stroke  . Colon cancer Maternal Aunt   . Thyroid disease Son   . Breast cancer Neg Hx  ROS Per hpi  OBJECTIVE:  Today's Vitals   03/03/20 1404 03/03/20 1413  BP: (!) 144/85 136/80  Pulse: 82   Resp: 15   Temp: 97.6 F (36.4 C)   TempSrc: Temporal   SpO2: 96%   Weight: 127 lb 3.2 oz (57.7 kg)   Height: 5' 3.25" (1.607 m)    Body mass index is 22.35 kg/m.   Physical Exam Vitals and nursing note reviewed.  Constitutional:      Appearance: She is well-developed.  HENT:     Head: Normocephalic and atraumatic.  Eyes:     General: Lids are normal. No scleral icterus.    Extraocular Movements: Extraocular movements intact.     Conjunctiva/sclera: Conjunctivae normal.     Pupils: Pupils are equal, round, and reactive to light.     Left eye: No fluorescein uptake.  Pulmonary:     Effort: Pulmonary effort is normal.  Musculoskeletal:     Cervical back: Neck supple.  Skin:    General: Skin is  warm and dry.       Neurological:     Mental Status: She is alert and oriented to person, place, and time.       No results found for this or any previous visit (from the past 24 hour(s)).  No results found.   ASSESSMENT and PLAN  1. Herpes zoster without complication Per exam today, no ocular involvement. Discussed supportive measures, new meds r/se/b and RTC/optho precautions. Patient educational handout given.  Other orders - valACYclovir (VALTREX) 1000 MG tablet; Take 1 tablet (1,000 mg total) by mouth 3 (three) times daily.  Return if symptoms worsen or fail to improve.    Rutherford Guys, MD [facility] [place], [place] [address] Ph.  [phone] Fax [phone]

## 2020-03-03 NOTE — Patient Instructions (Addendum)
   If you have lab work done today you will be contacted with your lab results within the next 2 weeks.  If you have not heard from us then please contact us. The fastest way to get your results is to register for My Chart.   IF you received an x-ray today, you will receive an invoice from [facility]. Please contact [facility] at [phone] with questions or concerns regarding your invoice.   IF you received labwork today, you will receive an invoice from [facility]. Please contact [facility] at [phone] with questions or concerns regarding your invoice.   Our billing staff will not be able to assist you with questions regarding bills from these companies.  You will be contacted with the lab results as soon as they are available. The fastest way to get your results is to activate your My Chart account. Instructions are located on the last page of this paperwork. If you have not heard from us regarding the results in 2 weeks, please contact this office.     Shingles  Shingles is an infection. It gives you a painful skin rash and blisters that have fluid in them. Shingles is caused by the same germ (virus) that causes chickenpox. Shingles only happens in people who:  Have had chickenpox.  Have been given a shot of medicine (vaccine) to protect against chickenpox. Shingles is rare in this group. The first symptoms of shingles may be itching, tingling, or pain in an area on your skin. A rash will show on your skin a few days or weeks later. The rash is likely to be on one side of your body. The rash usually has a shape like a belt or a band. Over time, the rash turns into fluid-filled blisters. The blisters will break open, change into scabs, and dry up. Medicines may:  Help with pain and itching.  Help you get better sooner.  Help to prevent long-term problems. Follow these instructions at home: Medicines  Take over-the-counter and prescription medicines only as  told by your doctor.  Put on an anti-itch cream or numbing cream where you have a rash, blisters, or scabs. Do this as told by your doctor. Helping with itching and discomfort   Put cold, wet cloths (cold compresses) on the area of the rash or blisters as told by your doctor.  Cool baths can help you feel better. Try adding baking soda or dry oatmeal to the water to lessen itching. Do not bathe in hot water. Blister and rash care  Keep your rash covered with a loose bandage (dressing).  Wear loose clothing that does not rub on your rash.  Keep your rash and blisters clean. To do this, wash the area with mild soap and cool water as told by your doctor.  Check your rash every day for signs of infection. Check for: ? More redness, swelling, or pain. ? Fluid or blood. ? Warmth. ? Pus or a bad smell.  Do not scratch your rash. Do not pick at your blisters. To help you to not scratch: ? Keep your fingernails clean and cut short. ? Wear gloves or mittens when you sleep, if scratching is a problem. General instructions  Rest as told by your doctor.  Keep all follow-up visits as told by your doctor. This is important.  Wash your hands often with soap and water. If soap and water are not available, use hand sanitizer. Doing this lowers your chance of getting a skin infection   caused by germs (bacteria).  Your infection can cause chickenpox in people who have never had chickenpox or never got a shot of chickenpox vaccine. If you have blisters that did not change into scabs yet, try not to touch other people or be around other people, especially: ? Babies. ? Pregnant women. ? Children who have areas of red, itchy, or rough skin (eczema). ? Very old people who have transplants. ? People who have a long-term (chronic) sickness, like cancer or AIDS. Contact a doctor if:  Your pain does not get better with medicine.  Your pain does not get better after the rash heals.  You have any signs  of infection in the rash area. These signs include: ? More redness, swelling, or pain around the rash. ? Fluid or blood coming from the rash. ? The rash area feeling warm to the touch. ? Pus or a bad smell coming from the rash. Get help right away if:  The rash is on your face or nose.  You have pain in your face or pain by your eye.  You lose feeling on one side of your face.  You have trouble seeing.  You have ear pain, or you have ringing in your ear.  You have a loss of taste.  Your condition gets worse. Summary  Shingles gives you a painful skin rash and blisters that have fluid in them.  Shingles is an infection. It is caused by the same germ (virus) that causes chickenpox.  Keep your rash covered with a loose bandage (dressing). Wear loose clothing that does not rub on your rash.  If you have blisters that did not change into scabs yet, try not to touch other people or be around people. This information is not intended to replace advice given to you by your health care provider. Make sure you discuss any questions you have with your health care provider. Document Revised: 10/23/2018 Document Reviewed: 03/05/2017 Elsevier Patient Education  2020 Elsevier Inc.  

## 2020-03-07 ENCOUNTER — Encounter: Payer: Medicare Other | Admitting: Physical Therapy

## 2020-03-09 ENCOUNTER — Ambulatory Visit: Payer: Medicare Other | Admitting: Family Medicine

## 2020-03-09 ENCOUNTER — Other Ambulatory Visit: Payer: Self-pay

## 2020-03-09 ENCOUNTER — Ambulatory Visit (INDEPENDENT_AMBULATORY_CARE_PROVIDER_SITE_OTHER): Payer: Medicare Other | Admitting: Dermatology

## 2020-03-09 DIAGNOSIS — L739 Follicular disorder, unspecified: Secondary | ICD-10-CM

## 2020-03-09 DIAGNOSIS — L309 Dermatitis, unspecified: Secondary | ICD-10-CM

## 2020-03-09 MED ORDER — HYDROCORTISONE 2.5 % EX OINT: TOPICAL_OINTMENT | CUTANEOUS | 1 refills | Status: DC

## 2020-03-09 MED ORDER — CLINDAMYCIN PHOSPHATE 1 % EX SOLN: CUTANEOUS | 1 refills | Status: DC

## 2020-03-09 MED ORDER — CLOBETASOL PROPIONATE 0.05 % EX SOLN: CUTANEOUS | 0 refills | Status: DC

## 2020-03-09 MED ORDER — MOMETASONE FUROATE 0.1 % EX SOLN: CUTANEOUS | 2 refills | Status: DC

## 2020-03-09 NOTE — Progress Notes (Signed)
   Follow-Up Visit   Subjective  Cheyenne Sanders is a 69 y.o. female who presents for the following: Rash.  Patient present today for a rash that started about 3 weeks ago, seen PCP on 03/03/20, dx Shingles, given Valacyclovir 1g tid. Patient states swelling seems to have gone down, still burns, and itches and would like to be evaluated today  The following portions of the chart were reviewed this encounter and updated as appropriate:  Tobacco  Allergies  Meds  Problems  Med Hx  Surg Hx  Fam Hx      Review of Systems:  No other skin or systemic complaints except as noted in HPI or Assessment and Plan.  Objective  Well appearing patient in no apparent distress; mood and affect are within normal limits.  A focused examination was performed including head, including the scalp, face, neck, nose, ears, eyelids, and lips. Relevant physical exam findings are noted in the Assessment and Plan.  Objective  Right Temple: Excoriated follicular pink papules at left forehead and left frontal scalp   Assessment & Plan  Folliculitis Right Temple  Exam not suggestive of shingles today but of course I am not seeing it at the beginning.  Recommend completing course of valacyclovir as directed to be cautious.  Reassured patient that I am not concerned for zoster progressing to involve the eye at this time.  Exam today most suggestive of folliculitis.  Start Clindamycin 1% solution twice daily to affected areas on scalp up to two weeks. Start Clobetasol solution twice daily to affected areas on scalp up to two weeks. Avoid face, groin and underarms Start HC 2.5% cream  twice daily to affected areas on face and eye brown for up to 2 weeks  Topical steroids (such as triamcinolone, fluocinolone, fluocinonide, mometasone, clobetasol, halobetasol, betamethasone, hydrocortisone) can cause thinning and lightening of the skin if they are used for too long in the same area. Your physician has selected  the right strength medicine for your problem and area affected on the body. Please use your medication only as directed by your physician to prevent side effects.     Ordered Medications: clindamycin (CLEOCIN T) 1 % external solution clobetasol (TEMOVATE) 0.05 % external solution hydrocortisone 2.5 % ointment  Other Related Medications mometasone (ELOCON) 0.1 % lotion  Return in about 3 weeks (around 12/19/3014) for Folliculitis.  IDonzetta Kohut, CMA, am acting as scribe for Forest Gleason, MD .  Documentation: I have reviewed the above documentation for accuracy and completeness, and I agree with the above.  Forest Gleason, MD

## 2020-03-09 NOTE — Patient Instructions (Addendum)
Recommend daily broad spectrum sunscreen SPF 30+ to sun-exposed areas, reapply every 2 hours as needed. Call for new or changing lesions.   Start Clindamycin 1% solution twice daily to affected areas on scalp up to two weeks. Start Clobetasol solution twice daily to affected areas on scalp up to two weeks. Avoid face, groin and underarms Start HC 2.5% cream  twice daily to affected areas on face and eye brown for up to 2 weeks  Topical steroids (such as triamcinolone, fluocinolone, fluocinonide, mometasone, clobetasol, halobetasol, betamethasone, hydrocortisone) can cause thinning and lightening of the skin if they are used for too long in the same area. Your physician has selected the right strength medicine for your problem and area affected on the body. Please use your medication only as directed by your physician to prevent side effects.

## 2020-03-10 ENCOUNTER — Other Ambulatory Visit: Payer: Self-pay

## 2020-03-10 ENCOUNTER — Encounter: Payer: Self-pay | Admitting: Family Medicine

## 2020-03-10 ENCOUNTER — Ambulatory Visit (INDEPENDENT_AMBULATORY_CARE_PROVIDER_SITE_OTHER): Payer: Medicare Other | Admitting: Family Medicine

## 2020-03-10 VITALS — BP 140/84 | HR 70 | Temp 98.7°F | Ht 63.25 in | Wt 125.8 lb

## 2020-03-10 DIAGNOSIS — B029 Zoster without complications: Secondary | ICD-10-CM | POA: Diagnosis not present

## 2020-03-10 NOTE — Progress Notes (Signed)
8/27/202110:53 AM  Cheyenne Sanders 1950/08/23, 69 y.o., female [id number]  Chief Complaint  Patient presents with  . Advice Only    son suggested that she go she derm for 2nd opinion on waht was dx as shingles. Told it was folliculitis, given meds but has not picked them up.Marland Kitchen also wants to talk about vaccine for pneumonia , shingles and flu    HPI:   Patient is a 69 y.o. female who presents today for rash on forehead  Seen by me on Mar 03 2020 - tx for shingles, has one tablet left Saw derm yesterday per son's recommendation- dx as folliculitus, rx topical clinda and mometasone, fu 3 weeks She is scheduled to see eye doctor  She reports rash is sign improved, almost gone Still has a bit of skin sensitivity She denies any eye or vision changes She is planning on getting shingrix vaccine at her pharmacy in a month or 2 She has no other concerns today  Depression screen [facility] 2/9 10/05/2019 09/28/2019 08/17/2019  Decreased Interest 0 0 0  Down, Depressed, Hopeless 0 0 0  PHQ - 2 Score 0 0 0    Fall Risk  03/03/2020 10/05/2019 09/28/2019 08/17/2019 06/03/2019  Falls in the past year? 0 0 0 0 0  Number falls in past yr: 0 0 - 0 0  Injury with Fall? 0 0 - 0 0  Risk for fall due to : No Fall Risks - - - -  Follow up Falls evaluation completed Falls evaluation completed Falls evaluation completed - -     Allergies  Allergen Reactions  . Sublimaze [Fentanyl] Shortness Of Breath    Prior to Admission medications   Medication Sig Start Date End Date Taking? Authorizing Provider  aspirin 325 MG tablet Take 325 mg by mouth as needed.    [provider]  Cholecalciferol (D3-1000) 25 MCG (1000 UT) capsule Take 1,000 Units by mouth daily.    [provider]  clindamycin (CLEOCIN T) 1 % external solution twice daily to affected areas on scalp up to two weeks. 03/09/20   Moye, [place], MD  clobetasol (TEMOVATE) 0.05 % external solution twice daily to affected areas on  scalp up to two weeks. 03/09/20   Moye, [place], MD  hydrocortisone 2.5 % ointment twice daily to affected areas on scalp up to two weeks. 03/09/20   Moye, [place], MD  methylcellulose (CITRUCEL) oral powder Take as directed daily 12/16/19   Armbruster, Carlota Raspberry, MD  metoprolol tartrate (LOPRESSOR) 25 MG tablet Take 1 tablet (25 mg total) by mouth as needed. 10/28/19   Troy Sine, MD  mometasone (ELOCON) 0.1 % lotion Apply to affected areas scalp twice daily. 03/09/20   Moye, [place], MD  Multiple Vitamins-Minerals (PRESERVISION AREDS 2 PO) Take by mouth.    [provider]  neomycin-polymyxin b-dexamethasone (MAXITROL) 3.5-10000-0.1 OINT SMARTSIG:1 In Eye(s) Twice Daily 02/15/20   [provider]  valACYclovir (VALTREX) 1000 MG tablet Take 1 tablet (1,000 mg total) by mouth 3 (three) times daily. 03/03/20   Rutherford Guys, MD    Past Medical History:  Diagnosis Date  . Breast cancer ([place]) 1941,7408   R breast cancer x 2; lumpectomy with radiation, chemotherapy; mastectomy R.  . Colon polyps   . Hyperlipidemia   . Hypertension   . Nephrolithiasis   . Personal history of chemotherapy   . Personal history of radiation therapy   . Pneumonia     Past Surgical History:  Procedure  Laterality Date  . BREAST BIOPSY Left 2016  . BREAST LUMPECTOMY Right 1999  . MASTECTOMY Right    2014  . PARTIAL HYSTERECTOMY  1986   DUB; ovaries intact.    Social History   Tobacco Use  . Smoking status: Never Smoker  . Smokeless tobacco: Never Used  Substance Use Topics  . Alcohol use: Yes    Alcohol/week: 1.0 standard drink    Types: 1 Glasses of wine per week    Comment: 2-3 times a year    Family History  Problem Relation Age of Onset  . Heart disease Mother   . Colon cancer Maternal Grandfather   . Heart attack Maternal Grandfather        or ? stroke  . Colon cancer Maternal Aunt   . Thyroid disease Son   . Breast cancer Neg Hx     ROS Per  hpi  OBJECTIVE:  Today's Vitals   03/10/20 1053  BP: 140/84  Pulse: 70  Temp: 98.7 F (37.1 C)  SpO2: 100%  Weight: 125 lb 12.8 oz (57.1 kg)  Height: 5' 3.25" (1.607 m)   Body mass index is 22.11 kg/m.   Physical Exam   Gen: AAOx3, NAD Skin: area of involvement sign improved. Scant crusted lesions along original clusters.   No results found for this or any previous visit (from the past 24 hour(s)).  No results found.   ASSESSMENT and PLAN  1. Herpes zoster without complication Resolved.   No follow-ups on file.    Rutherford Guys, MD [facility] [place], [place] [address] Ph.  [phone] Fax [phone]

## 2020-03-10 NOTE — Patient Instructions (Signed)
° ° ° °  If you have lab work done today you will be contacted with your lab results within the next 2 weeks.  If you have not heard from us then please contact us. The fastest way to get your results is to register for My Chart. ° ° °IF you received an x-ray today, you will receive an invoice from [facility]. Please contact [facility] at [phone] with questions or concerns regarding your invoice.  ° °IF you received labwork today, you will receive an invoice from [facility]. Please contact [facility] at [phone] with questions or concerns regarding your invoice.  ° °Our billing staff will not be able to assist you with questions regarding bills from these companies. ° °You will be contacted with the lab results as soon as they are available. The fastest way to get your results is to activate your My Chart account. Instructions are located on the last page of this paperwork. If you have not heard from us regarding the results in 2 weeks, please contact this office. °  ° ° ° °

## 2020-03-14 ENCOUNTER — Encounter: Payer: Medicare Other | Admitting: Physical Therapy

## 2020-03-14 ENCOUNTER — Encounter: Payer: Self-pay | Admitting: Dermatology

## 2020-03-14 DIAGNOSIS — H43812 Vitreous degeneration, left eye: Secondary | ICD-10-CM | POA: Diagnosis not present

## 2020-03-14 DIAGNOSIS — H2513 Age-related nuclear cataract, bilateral: Secondary | ICD-10-CM | POA: Diagnosis not present

## 2020-03-14 DIAGNOSIS — B029 Zoster without complications: Secondary | ICD-10-CM | POA: Diagnosis not present

## 2020-03-17 ENCOUNTER — Ambulatory Visit: Payer: Medicare Other | Admitting: Family Medicine

## 2020-03-21 ENCOUNTER — Encounter: Payer: Medicare Other | Admitting: Physical Therapy

## 2020-03-23 ENCOUNTER — Ambulatory Visit (INDEPENDENT_AMBULATORY_CARE_PROVIDER_SITE_OTHER): Payer: Medicare Other | Admitting: Dermatology

## 2020-03-23 ENCOUNTER — Other Ambulatory Visit: Payer: Self-pay

## 2020-03-23 DIAGNOSIS — T148XXA Other injury of unspecified body region, initial encounter: Secondary | ICD-10-CM | POA: Diagnosis not present

## 2020-03-23 DIAGNOSIS — D1800 Hemangioma unspecified site: Secondary | ICD-10-CM

## 2020-03-23 DIAGNOSIS — Z8619 Personal history of other infectious and parasitic diseases: Secondary | ICD-10-CM

## 2020-03-23 DIAGNOSIS — L503 Dermatographic urticaria: Secondary | ICD-10-CM | POA: Diagnosis not present

## 2020-03-23 NOTE — Patient Instructions (Addendum)
  Gentle Skin Care Guide  1. Bathe no more than once a day.  2. Avoid bathing in hot water  3. Use a mild soap like Dove, Vanicream, Cetaphil, CeraVe. Can use Lever 2000 or Cetaphil antibacterial soap  4. Use soap only where you need it. On most days, use it under your arms, between your legs, and on your feet. Let the water rinse other areas unless visibly dirty.  5. When you get out of the bath/shower, use a towel to gently blot your skin dry, don't rub it.  6. While your skin is still a little damp, apply a moisturizing cream such as Vanicream, CeraVe, Cetaphil, Eucerin, Sarna lotion or plain Vaseline Jelly. For hands apply Neutrogena [place] Hand Cream or Excipial Hand Cream.  7. Reapply moisturizer any time you start to itch or feel dry.  8. Sometimes using free and clear laundry detergents can be helpful. Fabric softener sheets should be avoided. Downy Free & Gentle liquid, or any liquid fabric softener that is free of dyes and perfumes, it acceptable to use  9. If your doctor has given you prescription creams you may apply moisturizers over them   Over the counter Antihistamines Allegra, Claritin, Zyrtec   Sample of shampoo [place] clear,

## 2020-03-23 NOTE — Progress Notes (Signed)
   Follow-Up Visit   Subjective  Cheyenne Sanders is a 69 y.o. female who presents for the following: Rash (pt was diagnosed with Shingles on the L side area Aug 20, she went to her PCP and was given Valtrex tablet ). Pt concerned about at rash on the R side today   The following portions of the chart were reviewed this encounter and updated as appropriate:  Tobacco  Allergies  Meds  Problems  Med Hx  Surg Hx  Fam Hx     Review of Systems:  No other skin or systemic complaints except as noted in HPI or Assessment and Plan.  Objective  Well appearing patient in no apparent distress; mood and affect are within normal limits.  A focused examination was performed including trunk,exts . Relevant physical exam findings are noted in the Assessment and Plan.  Objective  Head - Anterior (Face): Hx of shingles clear today  Objective  Nose: Liner excoriations   Objective  trunk: Dermatographism    Assessment & Plan  History of shingles Head - Anterior (Face) Clear. Observe for recurrence. Call clinic for new or changing lesions.  Recommend regular skin exams, daily broad-spectrum spf 30+ sunscreen use, and photoprotection.     Excoriation Nose Benign-appearing.  Observation.  Call clinic for new or changing moles.  Recommend daily use of broad spectrum spf 30+ sunscreen to sun-exposed areas.    Dermatographism and Urticaria with itch Advised patient this is not consistent with current shingles. trunk Start otc Antihistamine Allegra or Claritin or Zyrtec daily if needed  Start otc Dove sensitive skin cleansers  Start otc Cerave cream daily    Hemangiomas Back  - Red papules - Discussed benign nature - Observe - Call for any changes  Return if symptoms worsen or fail to improve.  IMarye Round, CMA, am acting as scribe for Sarina Ser, MD .  Documentation: I have reviewed the above documentation for accuracy and completeness, and I agree with the  above.  Sarina Ser, MD

## 2020-03-26 ENCOUNTER — Encounter: Payer: Self-pay | Admitting: Dermatology

## 2020-03-29 ENCOUNTER — Ambulatory Visit: Payer: Medicare Other | Admitting: Dermatology

## 2020-04-05 ENCOUNTER — Ambulatory Visit: Payer: Medicare Other | Admitting: Podiatry

## 2020-04-06 ENCOUNTER — Other Ambulatory Visit: Payer: Self-pay

## 2020-04-06 ENCOUNTER — Ambulatory Visit (INDEPENDENT_AMBULATORY_CARE_PROVIDER_SITE_OTHER): Payer: Medicare Other | Admitting: Podiatry

## 2020-04-06 ENCOUNTER — Ambulatory Visit
Admission: RE | Admit: 2020-04-06 | Discharge: 2020-04-06 | Disposition: A | Discharge status: Home / Self Care | Payer: Medicare Other | Source: Ambulatory Visit | Attending: Family Medicine | Admitting: Family Medicine

## 2020-04-06 ENCOUNTER — Encounter: Payer: Self-pay | Admitting: Podiatry

## 2020-04-06 ENCOUNTER — Ambulatory Visit (INDEPENDENT_AMBULATORY_CARE_PROVIDER_SITE_OTHER): Payer: Medicare Other | Admitting: *Deleted

## 2020-04-06 ENCOUNTER — Ambulatory Visit (INDEPENDENT_AMBULATORY_CARE_PROVIDER_SITE_OTHER): Payer: Medicare Other

## 2020-04-06 DIAGNOSIS — Z1231 Encounter for screening mammogram for malignant neoplasm of breast: Secondary | ICD-10-CM | POA: Diagnosis not present

## 2020-04-06 DIAGNOSIS — M722 Plantar fascial fibromatosis: Secondary | ICD-10-CM | POA: Diagnosis not present

## 2020-04-06 DIAGNOSIS — Z853 Personal history of malignant neoplasm of breast: Secondary | ICD-10-CM

## 2020-04-06 DIAGNOSIS — L603 Nail dystrophy: Secondary | ICD-10-CM

## 2020-04-06 IMAGING — MG DIGITAL SCREENING UNILAT LEFT W/ TOMO W/ CAD
4 series · 4 of 12 positions shown · non-contrast
Comparison: Previous exam(s).

CLINICAL DATA: Screening.

EXAM:
DIGITAL SCREENING UNILATERAL LEFT MAMMOGRAM WITH CAD AND TOMO

[L MLO synth-2D]
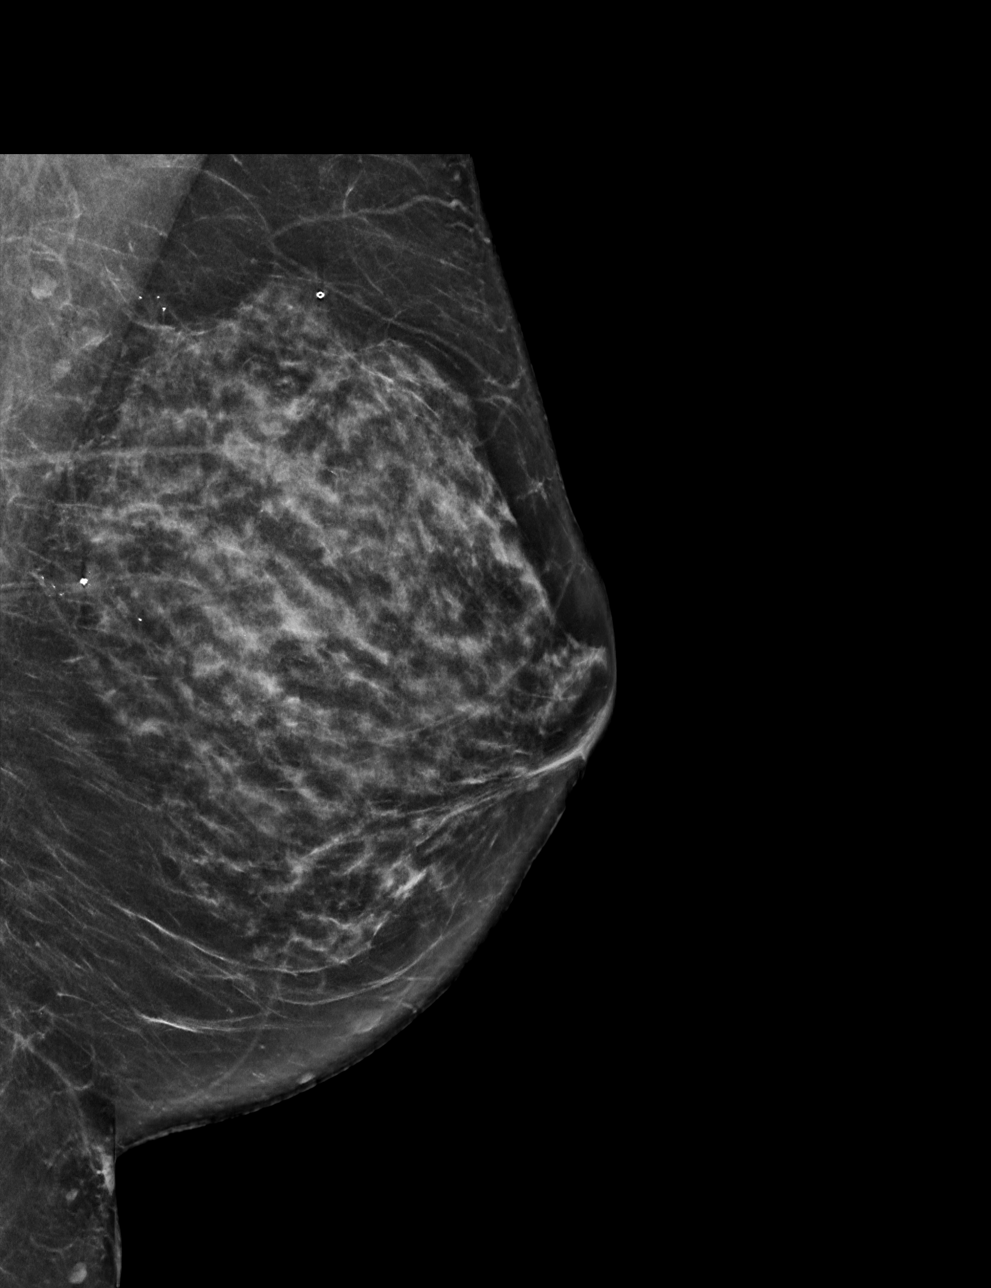

[L CC synth-2D]
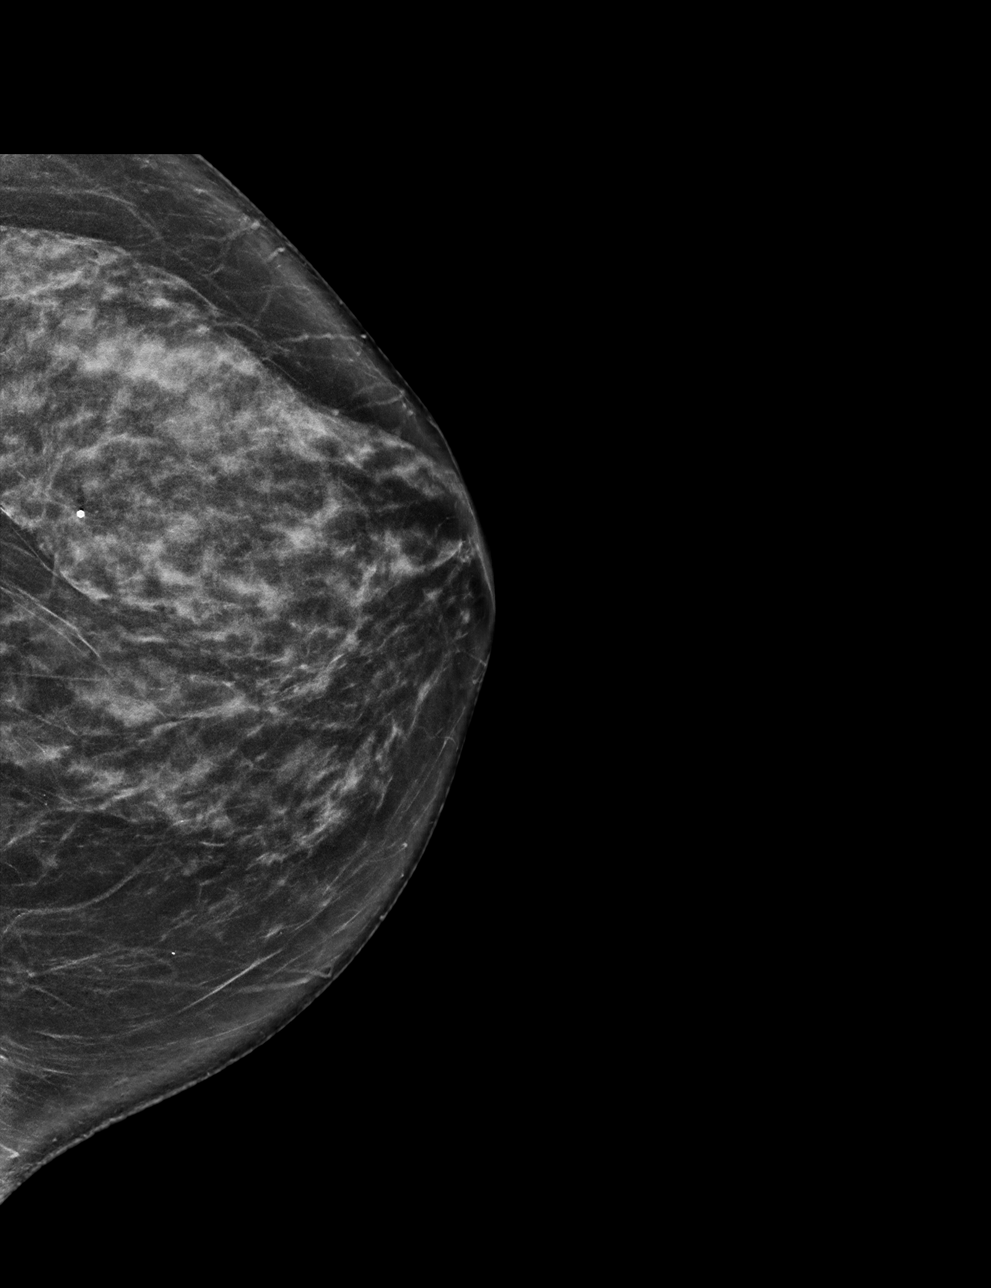

[L CC tomo · tomo slice 35/69.0]
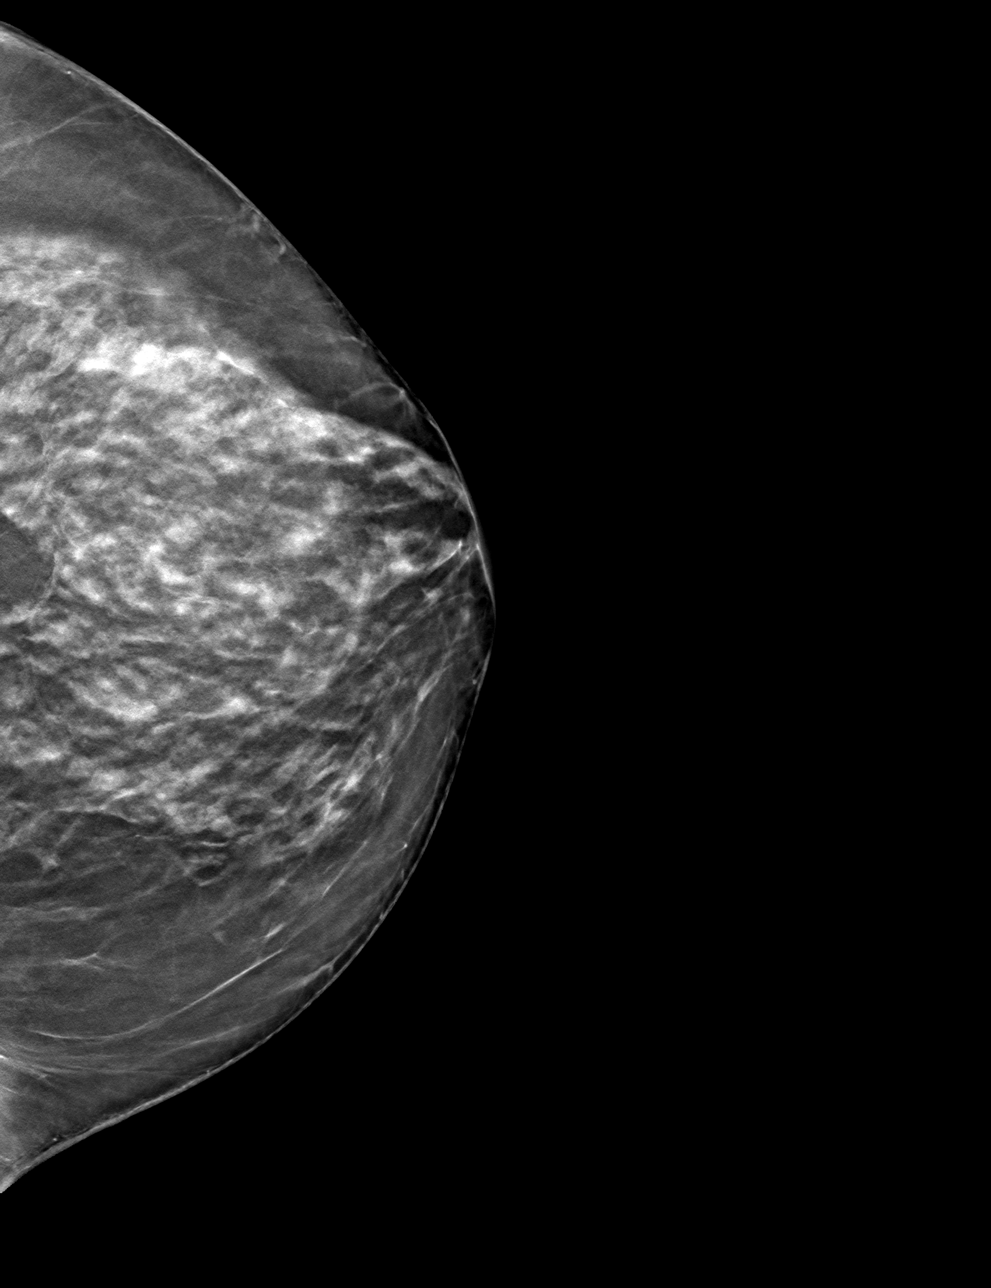

[L MLO tomo · tomo slice 34/67.0]
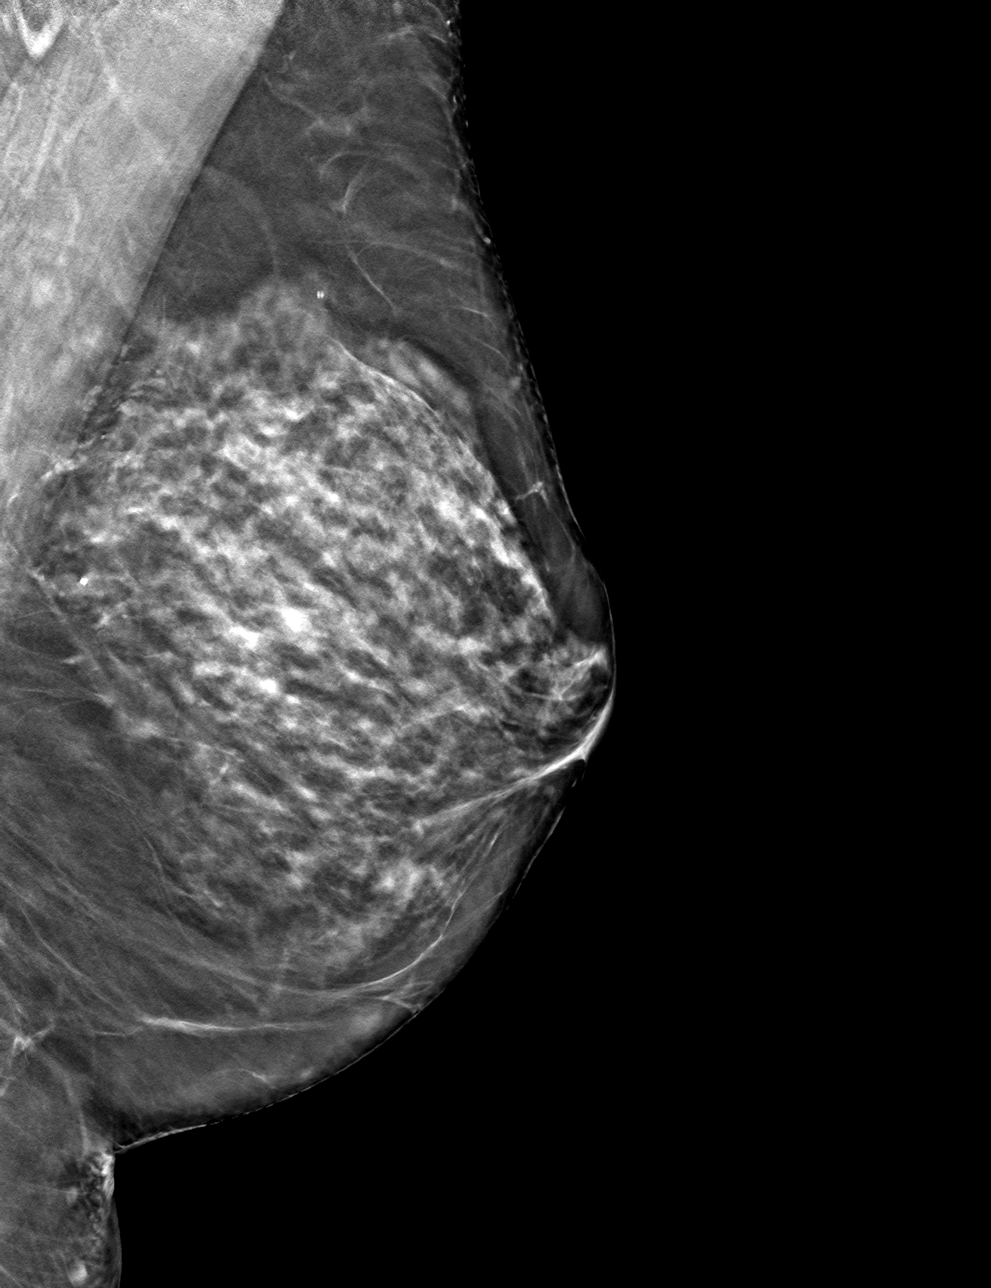

[4 of 12 positions shown; findings below may reference images not displayed]

ACR Breast Density Category c: The breast tissue is heterogeneously
dense, which may obscure small masses.
FINDINGS: The patient has had a right mastectomy. There are no findings
suspicious for malignancy.

Images were processed with CAD.
IMPRESSION: No mammographic evidence of malignancy. A result letter of this
screening mammogram will be mailed directly to the patient.

RECOMMENDATION:
Screening mammogram in one year.  (Code:[H0])

BI-RADS CATEGORY  1: Negative.

## 2020-04-06 NOTE — Progress Notes (Signed)
She presents today chief complaint of heel pain left.  She has been seeing Caryl Pina for several months now for laser therapy on the hallux left as well.  States that it is improving but the margins still remain discolored.  States that her heel pain is been going on now for the past week or 2 she soaked in Epson salts and she is tried ibuprofen.  Objective: Vital signs are stable alert oriented x3.  Pulses are palpable.  Nail appears to be healing very nicely.  The margins do demonstrate some discoloration still.  He still has pain on palpation medial calcaneal tubercle of her left heel.  Radiographs taken today demonstrate a soft tissue increase in density at the plantar fascial calcaneal insertion site and just distal to that  Assessment: Follow-up laser therapy.  Appears to be improving.  Also plantar fasciitis.  Plan: Injected her left heel placed in a plantar fascial brace and she was lasered again today.  Follow-up with her as needed.

## 2020-04-06 NOTE — Patient Instructions (Signed)

## 2020-04-07 ENCOUNTER — Other Ambulatory Visit: Payer: Medicare Other

## 2020-04-07 DIAGNOSIS — B351 Tinea unguium: Secondary | ICD-10-CM

## 2020-04-07 NOTE — Progress Notes (Signed)
Patient presents today for the 6th laser treatment. Diagnosed with mycotic nail infection by Dr. Milinda Pointer.  She was in today for an appointment to see Dr. Milinda Pointer for heel pain. She was scheduled for her laser tomorrow, but told her since she was here we could go ahead and do it today.  Toenail most affected hallux left. The nail is looking much clearer, the borders still just slow to grow and still some discoloration. Dr. Milinda Pointer said it may stay this way along the borders.  All other systems are negative.  Nail was filed thin. Laser therapy was administered to 1st toenails left and patient tolerated the treatment well. All safety precautions were in place.    Follow up in 3 months for possible laser #7 since nail is growing so slowly we may need to do one more.   ~Take final pic next visit~

## 2020-04-10 ENCOUNTER — Telehealth: Payer: Self-pay

## 2020-04-10 NOTE — Telephone Encounter (Signed)
Pt called today stating that she saw Dr. Milinda Pointer on 04/06/20 for plantar fasciitis. She was given a list of exercises and is concerned about if she needs to complete the all. Please advise patient.

## 2020-04-12 NOTE — Telephone Encounter (Signed)
L/M she could try them all or pick the one she feels works the best

## 2020-04-21 ENCOUNTER — Other Ambulatory Visit: Payer: Self-pay

## 2020-04-21 ENCOUNTER — Encounter: Payer: Self-pay | Admitting: Family Medicine

## 2020-04-21 ENCOUNTER — Ambulatory Visit (INDEPENDENT_AMBULATORY_CARE_PROVIDER_SITE_OTHER): Payer: Medicare Other | Admitting: Family Medicine

## 2020-04-21 VITALS — BP 132/80 | HR 67 | Temp 98.6°F | Ht 64.0 in | Wt 126.0 lb

## 2020-04-21 DIAGNOSIS — I10 Essential (primary) hypertension: Secondary | ICD-10-CM

## 2020-04-21 DIAGNOSIS — Z23 Encounter for immunization: Secondary | ICD-10-CM

## 2020-04-21 DIAGNOSIS — R1084 Generalized abdominal pain: Secondary | ICD-10-CM

## 2020-04-21 MED ORDER — METOPROLOL SUCCINATE 25 MG PO CS24
25.0000 mg | EXTENDED_RELEASE_CAPSULE | Freq: Every day | ORAL | 1 refills | Status: DC
Start: 2020-04-21 — End: 2020-10-09

## 2020-04-21 NOTE — Progress Notes (Signed)
10/8/20213:24 PM  Cheyenne Sanders 1950/09/12, 69 y.o., female [id number]  Chief Complaint  Patient presents with  . Abdominal Pain    6 days     HPI:   Patient is a 69 y.o. female who presents today for abdominal pain  Having intermittent abd pain for last 6 days Started LUQ, then lower abd pain, then RUQ Today has had 2 BM after prunes, previously was minimally constipated Currently no pain Has been feeling bloated, some gassiness No nausea or vomiting or reflux appettite normal but being mindful because of pain Her colonoscpy will be due in dec 2021 - given h/o polyps Denies any melena or hematochezia No GU sx  She is requesting pneumovax 23 She gets flu vaccine late oct/early nov - pharmacy  Requesting refill of metoprolol which she takes daily  Wt Readings from Last 3 Encounters:  04/21/20 126 lb (57.2 kg)  03/10/20 125 lb 12.8 oz (57.1 kg)  03/03/20 127 lb 3.2 oz (57.7 kg)    Depression screen [facility] 2/9 04/21/2020 10/05/2019 09/28/2019  Decreased Interest 0 0 0  Down, Depressed, Hopeless 0 0 0  PHQ - 2 Score 0 0 0    Fall Risk  04/21/2020 03/03/2020 10/05/2019 09/28/2019 08/17/2019  Falls in the past year? 0 0 0 0 0  Number falls in past yr: 0 0 0 - 0  Injury with Fall? 0 0 0 - 0  Risk for fall due to : - No Fall Risks - - -  Follow up Falls evaluation completed Falls evaluation completed Falls evaluation completed Falls evaluation completed -     Allergies  Allergen Reactions  . Sublimaze [Fentanyl] Shortness Of Breath    Prior to Admission medications   Medication Sig Start Date End Date Taking? Authorizing Provider  aspirin 325 MG tablet Take 325 mg by mouth as needed.   Yes [provider]  Cholecalciferol (D3-1000) 25 MCG (1000 UT) capsule Take 1,000 Units by mouth daily.   Yes [provider]  Metoprolol Succinate 25 MG CS24 Take 25 mg by mouth daily.   Yes [provider]  Multiple Vitamins-Minerals (PRESERVISION AREDS 2  PO) Take by mouth.   Yes [provider]    Past Medical History:  Diagnosis Date  . Breast cancer ([place]) 9983,[id number]   R breast cancer x 2; lumpectomy with radiation, chemotherapy; mastectomy R.  . Colon polyps   . Hyperlipidemia   . Hypertension   . Nephrolithiasis   . Personal history of chemotherapy   . Personal history of radiation therapy   . Pneumonia     Past Surgical History:  Procedure Laterality Date  . BREAST BIOPSY Left 2016  . BREAST LUMPECTOMY Right 1999  . MASTECTOMY Right    2014  . PARTIAL HYSTERECTOMY  1986   DUB; ovaries intact.    Social History   Tobacco Use  . Smoking status: Never Smoker  . Smokeless tobacco: Never Used  Substance Use Topics  . Alcohol use: Yes    Alcohol/week: 1.0 standard drink    Types: 1 Glasses of wine per week    Comment: 2-3 times a year    Family History  Problem Relation Age of Onset  . Heart disease Mother   . Colon cancer Maternal Grandfather   . Heart attack Maternal Grandfather        or ? stroke  . Colon cancer Maternal Aunt   . Thyroid disease Son   . Breast cancer Neg Hx  Review of Systems  Constitutional: Negative for chills and fever.  Respiratory: Negative for cough and shortness of breath.   Cardiovascular: Negative for chest pain, palpitations and leg swelling.  per hpi  OBJECTIVE:  Today's Vitals   04/21/20 1457 04/21/20 1550  BP: (!) 147/80 132/80  Pulse: 67   Temp: 98.6 F (37 C)   TempSrc: Temporal   SpO2: 94%   Weight: 126 lb (57.2 kg)   Height: 5\' 4"  (1.626 m)    Body mass index is 21.63 kg/m.   Physical Exam Vitals and nursing note reviewed.  Constitutional:      Appearance: She is well-developed.  HENT:     Head: Normocephalic and atraumatic.     Mouth/Throat:     Pharynx: No oropharyngeal exudate.  Eyes:     General: No scleral icterus.    Extraocular Movements: Extraocular movements intact.     Conjunctiva/sclera: Conjunctivae normal.     Pupils: Pupils  are equal, round, and reactive to light.  Cardiovascular:     Rate and Rhythm: Normal rate and regular rhythm.     Heart sounds: Normal heart sounds. No murmur heard.  No friction rub. No gallop.   Pulmonary:     Effort: Pulmonary effort is normal.     Breath sounds: Normal breath sounds. No wheezing, rhonchi or rales.  Abdominal:     General: Bowel sounds are normal. There is no distension.     Palpations: Abdomen is soft. There is no hepatomegaly or mass.     Tenderness: There is no abdominal tenderness.  Musculoskeletal:     Cervical back: Neck supple.  Skin:    General: Skin is warm and dry.  Neurological:     Mental Status: She is alert and oriented to person, place, and time.     No results found for this or any previous visit (from the past 24 hour(s)).  No results found.   ASSESSMENT and PLAN  1. Diffuse abdominal pain Resolved today, unclear etiology, labs pending. RTC precautions given - CBC - Comprehensive metabolic panel - Lipase - Urinalysis, Routine w reflex microscopic  2. Essential hypertension Controlled. Continue current regime.   3. Need for vaccination  Other orders - Pneumococcal polysaccharide vaccine 23-valent greater than or equal to 2yo subcutaneous/IM - Metoprolol Succinate 25 MG CS24; Take 25 mg by mouth daily.  No follow-ups on file. prn    Rutherford Guys, MD [facility] [place], [place] [address] Ph.  [phone] Fax [phone]

## 2020-04-21 NOTE — Patient Instructions (Signed)
° ° ° °  If you have lab work done today you will be contacted with your lab results within the next 2 weeks.  If you have not heard from us then please contact us. The fastest way to get your results is to register for My Chart. ° ° °IF you received an x-ray today, you will receive an invoice from [facility]. Please contact [facility] at [phone] with questions or concerns regarding your invoice.  ° °IF you received labwork today, you will receive an invoice from [facility]. Please contact [facility] at [phone] with questions or concerns regarding your invoice.  ° °Our billing staff will not be able to assist you with questions regarding bills from these companies. ° °You will be contacted with the lab results as soon as they are available. The fastest way to get your results is to activate your My Chart account. Instructions are located on the last page of this paperwork. If you have not heard from us regarding the results in 2 weeks, please contact this office. °  ° ° ° °

## 2020-04-22 LAB — CBC
Hematocrit: 41.4 % (ref 34.0–46.6)
Hemoglobin: 14.1 g/dL (ref 11.1–15.9)
MCH: 31.6 pg (ref 26.6–33.0)
MCHC: 34.1 g/dL (ref 31.5–35.7)
MCV: 93 fL (ref 79–97)
Platelets: 261 10*3/uL (ref 150–450)
RBC: 4.46 x10E6/uL (ref 3.77–5.28)
RDW: 13.4 % (ref 11.7–15.4)
WBC: 8.6 10*3/uL (ref 3.4–10.8)

## 2020-04-22 LAB — COMPREHENSIVE METABOLIC PANEL
ALT: 53 IU/L — ABNORMAL HIGH (ref 0–32)
AST: 30 IU/L (ref 0–40)
Albumin/Globulin Ratio: 1.8 (ref 1.2–2.2)
Albumin: 4.4 g/dL (ref 3.8–4.8)
Alkaline Phosphatase: 109 IU/L (ref 44–121)
BUN/Creatinine Ratio: 21 (ref 12–28)
BUN: 14 mg/dL (ref 8–27)
Bilirubin Total: 0.4 mg/dL (ref 0.0–1.2)
CO2: 25 mmol/L (ref 20–29)
Calcium: 9.7 mg/dL (ref 8.7–10.3)
Chloride: 104 mmol/L (ref 96–106)
Creatinine, Ser: 0.66 mg/dL (ref 0.57–1.00)
GFR calc Af Amer: 106 mL/min/{1.73_m2} (ref >59)
GFR calc non Af Amer: 92 mL/min/{1.73_m2} (ref >59)
Globulin, Total: 2.5 g/dL (ref 1.5–4.5)
Glucose: 86 mg/dL (ref 65–99)
Potassium: 5.1 mmol/L (ref 3.5–5.2)
Sodium: 142 mmol/L (ref 134–144)
Total Protein: 6.9 g/dL (ref 6.0–8.5)

## 2020-04-22 LAB — URINALYSIS, ROUTINE W REFLEX MICROSCOPIC
Bilirubin, UA: NEGATIVE
Glucose, UA: NEGATIVE
Ketones, UA: NEGATIVE
Leukocytes,UA: NEGATIVE
Nitrite, UA: NEGATIVE
Protein,UA: NEGATIVE
RBC, UA: NEGATIVE
Specific Gravity, UA: 1.012 (ref 1.005–1.030)
Urobilinogen, Ur: 0.2 mg/dL (ref 0.2–1.0)
pH, UA: 6 (ref 5.0–7.5)

## 2020-04-22 LAB — LIPASE: Lipase: 23 U/L (ref 14–72)

## 2020-04-23 ENCOUNTER — Encounter: Payer: Self-pay | Admitting: Family Medicine

## 2020-04-24 ENCOUNTER — Telehealth: Payer: Self-pay | Admitting: Podiatry

## 2020-04-24 NOTE — Telephone Encounter (Signed)
Patient called asking to speak with Cheyenne Sanders. She had a steroid injection and is not getting better and feels that the pain may be getting worse. She does not have her exercise sheet and was requesting a copy or to see if she needs to come back in to see Dr. Milinda Pointer. Cheyenne Sanders, can you  Please call patient.

## 2020-04-25 NOTE — Telephone Encounter (Signed)
Pt called for her Lab Result Please Advice

## 2020-04-25 NOTE — Telephone Encounter (Signed)
Pt discussed with Aretha yesterday

## 2020-04-25 NOTE — Telephone Encounter (Signed)

## 2020-04-25 NOTE — Telephone Encounter (Signed)
Patient having a lot of burning-advised ice and would like an NSAID. Please advise what to send in?

## 2020-04-26 MED ORDER — MELOXICAM 15 MG PO TABS: 15.0000 mg | ORAL_TABLET | Freq: Every day | ORAL | 3 refills | Status: DC

## 2020-04-26 NOTE — Addendum Note (Signed)
Addended by: Clovis Riley E on: 04/26/2020 03:13 PM   Modules accepted: Orders

## 2020-04-26 NOTE — Telephone Encounter (Signed)
Rx sent to pharmacy-patient informed

## 2020-04-26 NOTE — Telephone Encounter (Signed)
Meloxicam.  Thanks

## 2020-05-01 ENCOUNTER — Ambulatory Visit (INDEPENDENT_AMBULATORY_CARE_PROVIDER_SITE_OTHER): Payer: Medicare Other | Admitting: Podiatry

## 2020-05-01 ENCOUNTER — Other Ambulatory Visit: Payer: Self-pay

## 2020-05-01 DIAGNOSIS — M722 Plantar fascial fibromatosis: Secondary | ICD-10-CM

## 2020-05-01 NOTE — Progress Notes (Signed)
   Subjective: 69 y.o. female presenting for follow-up evaluation of left plantar fasciitis.  Patient states that she received an injection on 04/06/2020 and she states that it did not help.  She has not been taking the meloxicam except for the last 2 days.  She is unsure and uncertain about the stretching exercises she was provided as well as the plantar fascial brace that was dispensed last visit.  She presents for further treatment evaluation  Past Medical History:  Diagnosis Date  . Breast cancer ([place]) [id number]   R breast cancer x 2; lumpectomy with radiation, chemotherapy; mastectomy R.  . Colon polyps   . Hyperlipidemia   . Hypertension   . Nephrolithiasis   . Personal history of chemotherapy   . Personal history of radiation therapy   . Pneumonia      Objective: Physical Exam General: The patient is alert and oriented x3 in no acute distress.  Dermatology: Skin is warm, dry and supple bilateral lower extremities. Negative for open lesions or macerations bilateral.   Vascular: Dorsalis Pedis and Posterior Tibial pulses palpable bilateral.  Capillary fill time is immediate to all digits.  Neurological: Epicritic and protective threshold intact bilateral.   Musculoskeletal: Tenderness to palpation to the plantar aspect of the left heel along the plantar fascia. All other joints range of motion within normal limits bilateral. Strength 5/5 in all groups bilateral.   Radiographic exam: Normal osseous mineralization. Joint spaces preserved. No fracture/dislocation/boney destruction. No other soft tissue abnormalities or radiopaque foreign bodies.   Assessment: 1. Plantar fasciitis left foot  Plan of Care:  1. Patient evaluated.  2.  Patient has only been taking the meloxicam for the last 2 days.  Continue meloxicam 15 mg daily as prescribed 3.  Plantar fascial brace was demonstrated today.  Continue wearing plantar fascial brace to support the arch 4.  Continue Brooks running  shoes and OTC arch supports 5.  Demonstrated stretching exercises today.  Continue daily stretching exercises 6.  Return to clinic in 4 weeks for follow-up   Edrick Kins, DPM [facility]  Dr. Edrick Kins, DPM    [address], [place] [address]                Office [phone]  Fax [phone]

## 2020-05-07 ENCOUNTER — Encounter (HOSPITAL_COMMUNITY): Payer: Self-pay | Admitting: Emergency Medicine

## 2020-05-07 ENCOUNTER — Other Ambulatory Visit: Payer: Self-pay

## 2020-05-07 ENCOUNTER — Inpatient Hospital Stay (HOSPITAL_COMMUNITY)
Admission: EM | Admit: 2020-05-07 | Discharge: 2020-05-10 | DRG: 690 | Disposition: A | Discharge status: Home / Self Care | Payer: Medicare Other | Attending: Student | Admitting: Student

## 2020-05-07 DIAGNOSIS — N10 Acute tubulo-interstitial nephritis: Principal | ICD-10-CM

## 2020-05-07 DIAGNOSIS — K862 Cyst of pancreas: Secondary | ICD-10-CM | POA: Diagnosis present

## 2020-05-07 DIAGNOSIS — Z9221 Personal history of antineoplastic chemotherapy: Secondary | ICD-10-CM

## 2020-05-07 DIAGNOSIS — N134 Hydroureter: Secondary | ICD-10-CM | POA: Diagnosis not present

## 2020-05-07 DIAGNOSIS — Z8701 Personal history of pneumonia (recurrent): Secondary | ICD-10-CM | POA: Diagnosis not present

## 2020-05-07 DIAGNOSIS — Z853 Personal history of malignant neoplasm of breast: Secondary | ICD-10-CM

## 2020-05-07 DIAGNOSIS — Z79899 Other long term (current) drug therapy: Secondary | ICD-10-CM

## 2020-05-07 DIAGNOSIS — E785 Hyperlipidemia, unspecified: Secondary | ICD-10-CM | POA: Diagnosis present

## 2020-05-07 DIAGNOSIS — R112 Nausea with vomiting, unspecified: Secondary | ICD-10-CM

## 2020-05-07 DIAGNOSIS — Z20822 Contact with and (suspected) exposure to covid-19: Secondary | ICD-10-CM | POA: Diagnosis present

## 2020-05-07 DIAGNOSIS — Z885 Allergy status to narcotic agent status: Secondary | ICD-10-CM | POA: Diagnosis not present

## 2020-05-07 DIAGNOSIS — I10 Essential (primary) hypertension: Secondary | ICD-10-CM | POA: Diagnosis present

## 2020-05-07 DIAGNOSIS — Z923 Personal history of irradiation: Secondary | ICD-10-CM

## 2020-05-07 DIAGNOSIS — Z8249 Family history of ischemic heart disease and other diseases of the circulatory system: Secondary | ICD-10-CM | POA: Diagnosis not present

## 2020-05-07 DIAGNOSIS — N136 Pyonephrosis: Secondary | ICD-10-CM | POA: Diagnosis present

## 2020-05-07 DIAGNOSIS — R52 Pain, unspecified: Secondary | ICD-10-CM

## 2020-05-07 DIAGNOSIS — B9620 Unspecified Escherichia coli [E. coli] as the cause of diseases classified elsewhere: Secondary | ICD-10-CM | POA: Diagnosis present

## 2020-05-07 DIAGNOSIS — Z791 Long term (current) use of non-steroidal anti-inflammatories (NSAID): Secondary | ICD-10-CM

## 2020-05-07 DIAGNOSIS — Z9011 Acquired absence of right breast and nipple: Secondary | ICD-10-CM

## 2020-05-07 DIAGNOSIS — N390 Urinary tract infection, site not specified: Secondary | ICD-10-CM | POA: Diagnosis not present

## 2020-05-07 DIAGNOSIS — K5732 Diverticulitis of large intestine without perforation or abscess without bleeding: Secondary | ICD-10-CM | POA: Diagnosis not present

## 2020-05-07 DIAGNOSIS — K5792 Diverticulitis of intestine, part unspecified, without perforation or abscess without bleeding: Secondary | ICD-10-CM

## 2020-05-07 DIAGNOSIS — Z86010 Personal history of colonic polyps: Secondary | ICD-10-CM

## 2020-05-07 DIAGNOSIS — D72825 Bandemia: Secondary | ICD-10-CM | POA: Diagnosis present

## 2020-05-07 DIAGNOSIS — E872 Acidosis: Secondary | ICD-10-CM | POA: Diagnosis present

## 2020-05-07 DIAGNOSIS — D72829 Elevated white blood cell count, unspecified: Secondary | ICD-10-CM | POA: Diagnosis not present

## 2020-05-07 DIAGNOSIS — Z90711 Acquired absence of uterus with remaining cervical stump: Secondary | ICD-10-CM

## 2020-05-07 DIAGNOSIS — K529 Noninfective gastroenteritis and colitis, unspecified: Secondary | ICD-10-CM | POA: Diagnosis present

## 2020-05-07 DIAGNOSIS — K869 Disease of pancreas, unspecified: Secondary | ICD-10-CM | POA: Diagnosis not present

## 2020-05-07 DIAGNOSIS — R1031 Right lower quadrant pain: Secondary | ICD-10-CM

## 2020-05-07 DIAGNOSIS — K760 Fatty (change of) liver, not elsewhere classified: Secondary | ICD-10-CM | POA: Diagnosis not present

## 2020-05-07 DIAGNOSIS — N200 Calculus of kidney: Secondary | ICD-10-CM | POA: Diagnosis present

## 2020-05-07 DIAGNOSIS — Z87442 Personal history of urinary calculi: Secondary | ICD-10-CM | POA: Diagnosis not present

## 2020-05-07 DIAGNOSIS — M25552 Pain in left hip: Secondary | ICD-10-CM | POA: Diagnosis not present

## 2020-05-07 DIAGNOSIS — N132 Hydronephrosis with renal and ureteral calculous obstruction: Secondary | ICD-10-CM | POA: Diagnosis not present

## 2020-05-07 DIAGNOSIS — N12 Tubulo-interstitial nephritis, not specified as acute or chronic: Secondary | ICD-10-CM | POA: Diagnosis not present

## 2020-05-07 DIAGNOSIS — M25551 Pain in right hip: Secondary | ICD-10-CM | POA: Diagnosis not present

## 2020-05-07 LAB — CBC
HCT: 40.8 % (ref 36.0–46.0)
Hemoglobin: 13.1 g/dL (ref 12.0–15.0)
MCH: 30.8 pg (ref 26.0–34.0)
MCHC: 32.1 g/dL (ref 30.0–36.0)
MCV: 96 fL (ref 80.0–100.0)
Platelets: 219 10*3/uL (ref 150–400)
RBC: 4.25 MIL/uL (ref 3.87–5.11)
RDW: 13.3 % (ref 11.5–15.5)
WBC: 6.6 10*3/uL (ref 4.0–10.5)
nRBC: 0 % (ref 0.0–0.2)

## 2020-05-07 LAB — COMPREHENSIVE METABOLIC PANEL
ALT: 41 U/L (ref 0–44)
AST: 38 U/L (ref 15–41)
Albumin: 3.9 g/dL (ref 3.5–5.0)
Alkaline Phosphatase: 67 U/L (ref 38–126)
Anion gap: 12 (ref 5–15)
BUN: 24 mg/dL — ABNORMAL HIGH (ref 8–23)
CO2: 20 mmol/L — ABNORMAL LOW (ref 22–32)
Calcium: 9.4 mg/dL (ref 8.9–10.3)
Chloride: 100 mmol/L (ref 98–111)
Creatinine, Ser: 0.83 mg/dL (ref 0.44–1.00)
GFR, Estimated: >60 mL/min (ref >60)
Glucose, Bld: 129 mg/dL — ABNORMAL HIGH (ref 70–99)
Potassium: 3.7 mmol/L (ref 3.5–5.1)
Sodium: 132 mmol/L — ABNORMAL LOW (ref 135–145)
Total Bilirubin: 1 mg/dL (ref 0.3–1.2)
Total Protein: 6.6 g/dL (ref 6.5–8.1)

## 2020-05-07 LAB — LIPASE, BLOOD: Lipase: 26 U/L (ref 11–51)

## 2020-05-07 MED ORDER — SODIUM CHLORIDE 0.9 % IV BOLUS
500.0000 mL | Freq: Once | INTRAVENOUS | Status: AC
  Administered 2020-05-07: 500 mL via INTRAVENOUS

## 2020-05-07 MED ORDER — OXYCODONE-ACETAMINOPHEN 5-325 MG PO TABS
1.0000 | ORAL_TABLET | Freq: Once | ORAL | Status: AC
  Administered 2020-05-07: 1 via ORAL
  Filled 2020-05-07: qty 1

## 2020-05-07 MED ORDER — ONDANSETRON HCL 4 MG/2ML IJ SOLN
4.0000 mg | Freq: Once | INTRAMUSCULAR | Status: AC
  Administered 2020-05-07: 4 mg via INTRAVENOUS
  Filled 2020-05-07: qty 2

## 2020-05-07 NOTE — ED Provider Notes (Signed)
[facility] EMERGENCY DEPARTMENT Provider Note   CSN: [id number] Arrival date & time: 05/07/20  2050     History Chief Complaint  Patient presents with  . Abdominal Pain  . Back Pain    Cheyenne Sanders is a 69 y.o. female with a hx aslisted below presents to the Emergency Department complaining of gradual, persistent, progressively worsening RLQ abd pain onset earlier today. Associated symptoms include vomiting.  Emesis is bloody and nonbilious.  Patient reports 2 bowel movements today the second 1 being somewhat loose but not specifically diarrhea.  No melena or hematochezia.  Patient does report a history of nephrolithiasis however it is not clear that today's symptoms are similar to previous.  Records reviewed.  She was seen by her primary care on 04/21/2020 with lower abdominal pain.  Labs were reassuring at that time and there was no evidence of urinary tract infection.  Patient reports pain today is significantly worse and more localized than it was then.  Patient reports pain began around 2 PM, was severe in nature and was associated with vomiting beginning around 6 PM.  Patient reports numerous episodes of vomiting.  She was given Percocet upon arrival which has improved her pain significantly.  She reports she is pain-free at this time.  Walking and moving made her pain worse earlier today.  She currently has no aggravating or alleviating factors.  The history is provided by the patient and medical records. No language interpreter was used.       Past Medical History:  Diagnosis Date  . Breast cancer ([place]) [id number]   R breast cancer x 2; lumpectomy with radiation, chemotherapy; mastectomy R.  . Colon polyps   . Hyperlipidemia   . Hypertension   . Nephrolithiasis   . Personal history of chemotherapy   . Personal history of radiation therapy   . Pneumonia     Patient Active Problem List   Diagnosis Date Noted  . Pain in joint of right hip 03/02/2019    . Skin infection 11/25/2017  . Tick bite 11/25/2017  . Flank pain 09/06/2016  . Recurrent nephrolithiasis 08/27/2016  . Personal history of breast cancer 08/27/2016  . Coronary artery disease involving native coronary artery of native heart without angina pectoris 06/28/2015  . Essential hypertension 06/28/2015  . Mixed hyperlipidemia 06/28/2015  . Precordial chest pain 06/28/2015    Past Surgical History:  Procedure Laterality Date  . BREAST BIOPSY Left 2016  . BREAST LUMPECTOMY Right 1999  . MASTECTOMY Right    2014  . PARTIAL HYSTERECTOMY  1986   DUB; ovaries intact.     OB History   No obstetric history on file.     Family History  Problem Relation Age of Onset  . Heart disease Mother   . Colon cancer Maternal Grandfather   . Heart attack Maternal Grandfather        or ? stroke  . Colon cancer Maternal Aunt   . Thyroid disease Son   . Breast cancer Neg Hx     Social History   Tobacco Use  . Smoking status: Never Smoker  . Smokeless tobacco: Never Used  Vaping Use  . Vaping Use: Never used  Substance Use Topics  . Alcohol use: Yes    Alcohol/week: 1.0 standard drink    Types: 1 Glasses of wine per week    Comment: 2-3 times a year  . Drug use: No    Home Medications Prior to Admission medications  Medication Sig Start Date End Date Taking? Authorizing Provider  aspirin 325 MG tablet Take 325 mg by mouth as needed.    [provider]  Cholecalciferol (D3-1000) 25 MCG (1000 UT) capsule Take 1,000 Units by mouth daily.    [provider]  meloxicam (MOBIC) 15 MG tablet Take 1 tablet (15 mg total) by mouth daily. 04/26/20   Hyatt, Max T, DPM  Metoprolol Succinate 25 MG CS24 Take 25 mg by mouth daily. 04/21/20   Rutherford Guys, MD  Multiple Vitamins-Minerals (PRESERVISION AREDS 2 PO) Take by mouth.    [provider]    Allergies    Sublimaze [fentanyl]  Review of Systems   Review of Systems  Constitutional: Negative for  appetite change, diaphoresis, fatigue, fever and unexpected weight change.  HENT: Negative for mouth sores.   Eyes: Negative for visual disturbance.  Respiratory: Negative for cough, chest tightness, shortness of breath and wheezing.   Cardiovascular: Negative for chest pain.  Gastrointestinal: Positive for abdominal pain, nausea and vomiting. Negative for constipation and diarrhea.  Endocrine: Negative for polydipsia, polyphagia and polyuria.  Genitourinary: Positive for flank pain. Negative for dysuria, frequency, hematuria and urgency.  Musculoskeletal: Negative for back pain and neck stiffness.  Skin: Negative for rash.  Allergic/Immunologic: Negative for immunocompromised state.  Neurological: Negative for syncope, light-headedness and headaches.  Hematological: Does not bruise/bleed easily.  Psychiatric/Behavioral: Negative for sleep disturbance. The patient is not nervous/anxious.     Physical Exam Updated Vital Signs BP (!) 154/91   Pulse 72   Temp 97.6 F (36.4 C) (Oral)   Resp 16   Ht 5\' 4"  (1.626 m)   Wt 60 kg   BMI 22.71 kg/m   Physical Exam Vitals and nursing note reviewed.  Constitutional:      General: She is not in acute distress.    Appearance: She is not diaphoretic.  HENT:     Head: Normocephalic.  Eyes:     General: No scleral icterus.    Conjunctiva/sclera: Conjunctivae normal.  Cardiovascular:     Rate and Rhythm: Normal rate and regular rhythm.     Pulses: Normal pulses.          Radial pulses are 2+ on the right side and 2+ on the left side.  Pulmonary:     Effort: No tachypnea, accessory muscle usage, prolonged expiration, respiratory distress or retractions.     Breath sounds: Normal breath sounds. No stridor.     Comments: Equal chest rise. No increased work of breathing. Abdominal:     General: There is no distension.     Palpations: Abdomen is soft.     Tenderness: There is no abdominal tenderness. There is no right CVA tenderness, left  CVA tenderness, guarding or rebound.  Musculoskeletal:     Cervical back: Normal range of motion.     Comments: Moves all extremities equally and without difficulty.  Skin:    General: Skin is warm and dry.     Capillary Refill: Capillary refill takes less than 2 seconds.  Neurological:     Mental Status: She is alert.     GCS: GCS eye subscore is 4. GCS verbal subscore is 5. GCS motor subscore is 6.     Comments: Speech is clear and goal oriented.  Psychiatric:        Mood and Affect: Mood normal.     ED Results / Procedures / Treatments   Labs (all labs ordered are listed, but only abnormal  results are displayed) Labs Reviewed  COMPREHENSIVE METABOLIC PANEL - Abnormal; Notable for the following components:      Result Value   Sodium 132 (*)    CO2 20 (*)    Glucose, Bld 129 (*)    BUN 24 (*)    All other components within normal limits  URINALYSIS, ROUTINE W REFLEX MICROSCOPIC - Abnormal; Notable for the following components:   Hgb urine dipstick LARGE (*)    Ketones, ur 5 (*)    Nitrite POSITIVE (*)    Leukocytes,Ua TRACE (*)    Bacteria, UA FEW (*)    All other components within normal limits  URINE CULTURE  RESPIRATORY PANEL BY RT PCR (FLU A&B, COVID)  LIPASE, BLOOD  CBC    Radiology CT ABDOMEN PELVIS W CONTRAST  Result Date: 05/08/2020 CLINICAL DATA:  Right lower quadrant abdominal pain. Low back pain. Vomiting. History of kidney stones and breast cancer. EXAM: CT ABDOMEN AND PELVIS WITH CONTRAST TECHNIQUE: Multidetector CT imaging of the abdomen and pelvis was performed using the standard protocol following bolus administration of intravenous contrast. CONTRAST:  126mL OMNIPAQUE IOHEXOL 300 MG/ML  SOLN COMPARISON:  None. FINDINGS: Lower chest: Minor linear atelectasis in both lower lobes. No pleural fluid. Hepatobiliary: There is an 11 x 11 mm low-density involving the far left lobe of the liver, series 3, image 15. Question of additional vague hypodensity in the  inferior right lobe, series 3, image 25. Subcapsular lesion in the hepatic dome series 3, image 6, subcentimeter. Mild gallbladder distension but no evidence of calcified gallstone or pericholecystic inflammation. No significant biliary dilatation. Pancreas: 12 mm low-density lesion in the pancreatic tail, series 3, image 17. No ductal dilatation or inflammation. Spleen: Normal in size without focal abnormality. Adrenals/Urinary Tract: Normal adrenal glands. 6 mm calcification in the right pelvis, difficult to differentiate within or adjacent to the right ureter. There is dilatation of both renal pelvises, more prominent on the right as well as mild right ureteral dilatation. Punctate nonobstructing stone in the upper right kidney. There is no significant perinephric edema. Slightly diminished excretion from the right renal collecting system on delayed phase imaging. Tiny cortical hypodensities in the left kidney are too small to characterize but likely small cysts. Urinary bladder is unremarkable. Stomach/Bowel: Small hiatal hernia. The stomach is decompressed. No small bowel dilatation, obstruction or inflammation. The appendix is tentatively but not definitively identified, there is no pericecal or right lower quadrant inflammation. Colonic diverticulosis involving the descending and sigmoid colon. The sigmoid colon is redundant. Questionable area of colonic wall thickening and pericolonic edema involving the redundant sigmoid colon, series 3, image 55. Vascular/Lymphatic: Aortic atherosclerosis. No aortic aneurysm. Patent portal vein. No enlarged lymph nodes in the abdomen or pelvis. Reproductive: Status post hysterectomy. No adnexal masses. Other: No free air, free fluid, or intra-abdominal fluid collection. 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 There are no acute or suspicious osseous abnormalities. IMPRESSION: 1. Questionable area of colonic wall thickening and pericolonic edema  involving the redundant sigmoid colon. This may represent short segment colitis or diverticulitis. No perforation. 2. Right pelvic 6 mm calcification, difficult to differentiate if this is within or adjacent to the right ureter or an adjacent phlebolith. There is mild right hydroureter and dilatation of the right renal collecting system without significant perinephric edema. There is also dilatation of the left renal pelvis suggesting underlying extrarenal pelvis configuration. 3. Additional nonobstructing right renal stone. 4. Low-density lesions in the liver, largest measuring 11 x  11 mm in the far left lobe of the liver, nonspecific. Given history of breast cancer, recommend further evaluation with hepatic protocol MRI. There is also a 12 mm low-density lesion in the pancreatic tail. Recommend further characterization with pancreatic protocol MRI on an elective basis. 5. Small hiatal hernia. Aortic Atherosclerosis (ICD10-I70.0). Electronically Signed   By: Keith Rake M.D.   On: 05/08/2020 00:45    Procedures Procedures (including critical care time)  Medications Ordered in ED Medications  ciprofloxacin (CIPRO) IVPB 400 mg (400 mg Intravenous New Bag/Given 05/08/20 0547)  metroNIDAZOLE (FLAGYL) IVPB 500 mg (has no administration in time range)  oxyCODONE-acetaminophen (PERCOCET/ROXICET) 5-325 MG per tablet 1 tablet (1 tablet Oral Given 05/07/20 2110)  sodium chloride 0.9 % bolus 500 mL (0 mLs Intravenous Stopped 05/08/20 0105)  ondansetron (ZOFRAN) injection 4 mg (4 mg Intravenous Given 05/07/20 2344)  iohexol (OMNIPAQUE) 300 MG/ML solution 100 mL (100 mLs Intravenous Contrast Given 05/08/20 0009)  morphine 4 MG/ML injection 4 mg (4 mg Intravenous Given 05/08/20 0034)  morphine 4 MG/ML injection 2 mg (2 mg Intravenous Given 05/08/20 0123)  LORazepam (ATIVAN) injection 0.5 mg (0.5 mg Intravenous Given 05/08/20 0120)  cefTRIAXone (ROCEPHIN) 1 g in sodium chloride 0.9 % 100 mL IVPB (0 g  Intravenous Stopped 05/08/20 0252)    ED Course  I have reviewed the triage vital signs and the nursing notes.  Pertinent labs & imaging results that were available during my care of the patient were reviewed by me and considered in my medical decision making (see chart for details).    MDM Rules/Calculators/A&P                           Patient presents with several hours of right lower quadrant abdominal pain with associated nonbloody nonbilious emesis.  Previous surgical history of partial hysterectomy.  Concern for possible diverticulitis, small bowel obstruction, appendicitis, colitis, metastatic disease.  Labs reassuring.  No hematuria.  Most consistent with nephrolithiasis or renal colic.  CT scan pending.  Patient resting comfortably at this time.  00:50 AM CT scan shows some colonic wall thickening and pericolonic edema of the sigmoid colon. Given patient's right lower quadrant abdominal pain this most likely represents diverticulitis. Additionally there is a 6 mm calcification in the right pelvis, unclear if this is in the right ureter. Mild hydronephrosis of both kidneys is noted however right slightly greater than left. Incidental findings of liver and pancreatic lesions. Given history of breast cancer she will need abdominal MRI for further evaluation for possible metastasis.  1:10 AM Patient with severe abdominal pain, writhing in bed and screaming. Ativan and morphine given.  1:30 AM Urinalysis shows large hemoglobin, positive nitrites and leukocytes with white blood cells and a few bacteria. Rocephin given. Suspect pyelonephritis given symptoms.  2:29 AM Pt sleeping; pain controlled.   5:30 AM Flagyl and Cipro ordered for treatment of diverticulitis. Will reassess and p.o. trial.  5:56 AM Patient with return of significant pain. She has persistent nausea and intermittent dry heaves. At this time she will need admission for IV antibiotics, pain control and slowly  advancing diet. Patient is in agreement with this.  The patient was discussed with and seen by Dr. Roxanne Mins who agrees with the treatment plan.  6:13 AM Discussed patient's case with hospitalist, Dr. Cyd Silence.  I have recommended admission and patient (and family if present) agree with this plan. Admitting physician will place admission orders.  Final Clinical Impression(s) / ED Diagnoses Final diagnoses:  Right lower quadrant abdominal pain  Acute pyelonephritis  Diverticulitis  Intractable vomiting with nausea, unspecified vomiting type    Rx / DC Orders ED Discharge Orders    None       Loni Muse Gwenlyn Perking 25/91/02 [id number]    Delora Fuel, MD 28/40/69 [phone]

## 2020-05-07 NOTE — ED Triage Notes (Signed)
Patient reports RLQ pain and low back pain onset this afternoon with emesis , denies diarrhea /no fever .

## 2020-05-07 NOTE — ED Provider Notes (Incomplete)
69 year old female with history of hypertension, hyperlipidemia, nephrolithiasis comes in with onset this afternoon of right-sided abdominal pain radiating to the back with associated nausea and vomiting.  She has also had some liquid stool without any evident diarrhea.  She does have history of prior hysterectomy.  Abdominal exam is benign but there is mild right CVA tenderness.  She is being sent for CT abdomen and pelvis.

## 2020-05-08 ENCOUNTER — Encounter (HOSPITAL_COMMUNITY)
Admission: EM | Disposition: A | Discharge status: Home / Self Care | Payer: Self-pay | Source: Home / Self Care | Attending: Internal Medicine

## 2020-05-08 ENCOUNTER — Observation Stay (HOSPITAL_COMMUNITY): Payer: Medicare Other | Admitting: Anesthesiology

## 2020-05-08 ENCOUNTER — Emergency Department (HOSPITAL_COMMUNITY): Payer: Medicare Other

## 2020-05-08 ENCOUNTER — Observation Stay (HOSPITAL_COMMUNITY): Payer: Medicare Other

## 2020-05-08 ENCOUNTER — Encounter (HOSPITAL_COMMUNITY): Payer: Self-pay | Admitting: *Deleted

## 2020-05-08 DIAGNOSIS — N10 Acute tubulo-interstitial nephritis: Principal | ICD-10-CM

## 2020-05-08 DIAGNOSIS — K862 Cyst of pancreas: Secondary | ICD-10-CM | POA: Diagnosis not present

## 2020-05-08 DIAGNOSIS — R1031 Right lower quadrant pain: Secondary | ICD-10-CM | POA: Diagnosis not present

## 2020-05-08 DIAGNOSIS — N200 Calculus of kidney: Secondary | ICD-10-CM | POA: Diagnosis not present

## 2020-05-08 DIAGNOSIS — M25551 Pain in right hip: Secondary | ICD-10-CM | POA: Diagnosis not present

## 2020-05-08 DIAGNOSIS — R112 Nausea with vomiting, unspecified: Secondary | ICD-10-CM | POA: Diagnosis not present

## 2020-05-08 DIAGNOSIS — K760 Fatty (change of) liver, not elsewhere classified: Secondary | ICD-10-CM | POA: Diagnosis not present

## 2020-05-08 DIAGNOSIS — M25552 Pain in left hip: Secondary | ICD-10-CM | POA: Diagnosis not present

## 2020-05-08 DIAGNOSIS — N132 Hydronephrosis with renal and ureteral calculous obstruction: Secondary | ICD-10-CM | POA: Diagnosis not present

## 2020-05-08 DIAGNOSIS — K5792 Diverticulitis of intestine, part unspecified, without perforation or abscess without bleeding: Secondary | ICD-10-CM | POA: Diagnosis present

## 2020-05-08 LAB — HIV ANTIBODY (ROUTINE TESTING W REFLEX): HIV Screen 4th Generation wRfx: NONREACTIVE

## 2020-05-08 LAB — URINALYSIS, ROUTINE W REFLEX MICROSCOPIC
Bilirubin Urine: NEGATIVE
Glucose, UA: NEGATIVE mg/dL
Ketones, ur: 5 mg/dL — AB
Nitrite: POSITIVE — AB
Protein, ur: NEGATIVE mg/dL
Specific Gravity, Urine: 1.028 (ref 1.005–1.030)
pH: 5 (ref 5.0–8.0)

## 2020-05-08 LAB — RESPIRATORY PANEL BY RT PCR (FLU A&B, COVID)
Influenza A by PCR: NEGATIVE
Influenza B by PCR: NEGATIVE
SARS Coronavirus 2 by RT PCR: NEGATIVE

## 2020-05-08 IMAGING — DX DG HIP (WITH OR WITHOUT PELVIS) 1V*L*
2 series · 2 of 2 positions shown · non-contrast
Comparison: Abdomen and pelvis CT from earlier the same day

CLINICAL DATA: Bilateral hip pain with no known injury

EXAM:
DG HIP (WITH OR WITHOUT PELVIS) 1V*L*; DG HIP (WITH OR WITHOUT
PELVIS) 1V RIGHT

[pelvis ap]
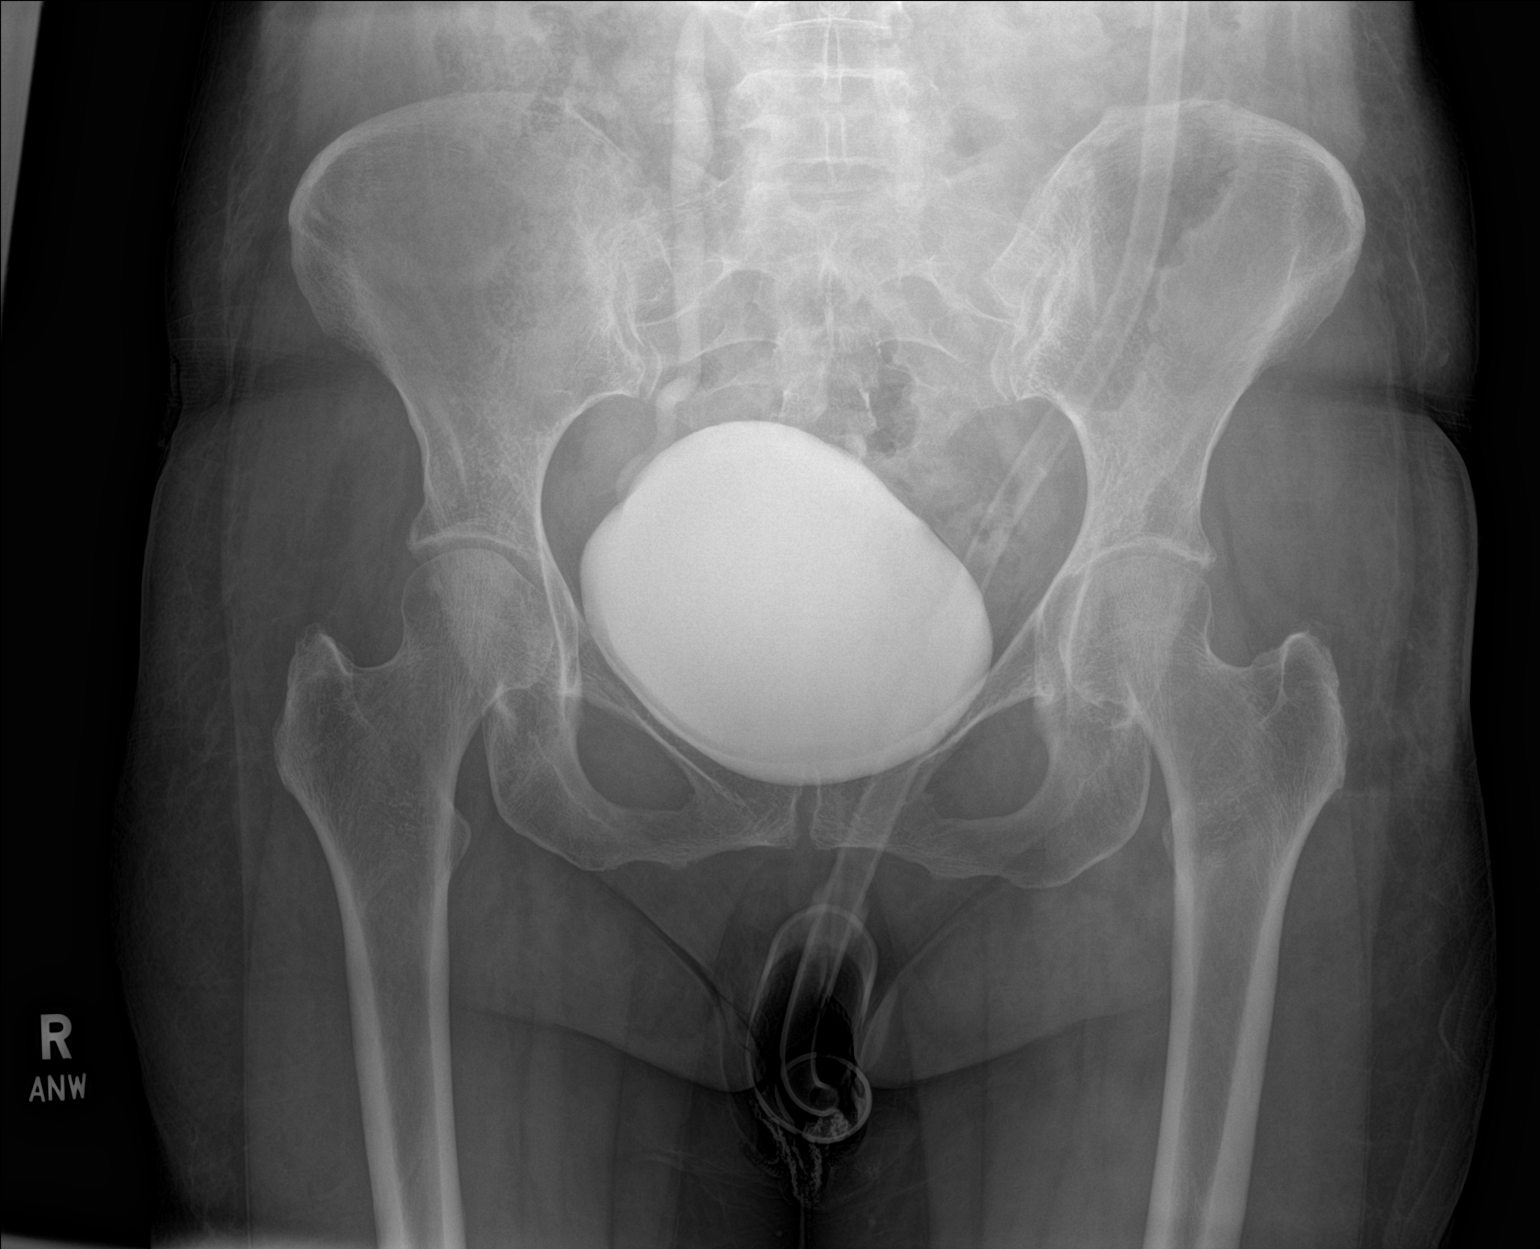

[hip lat]
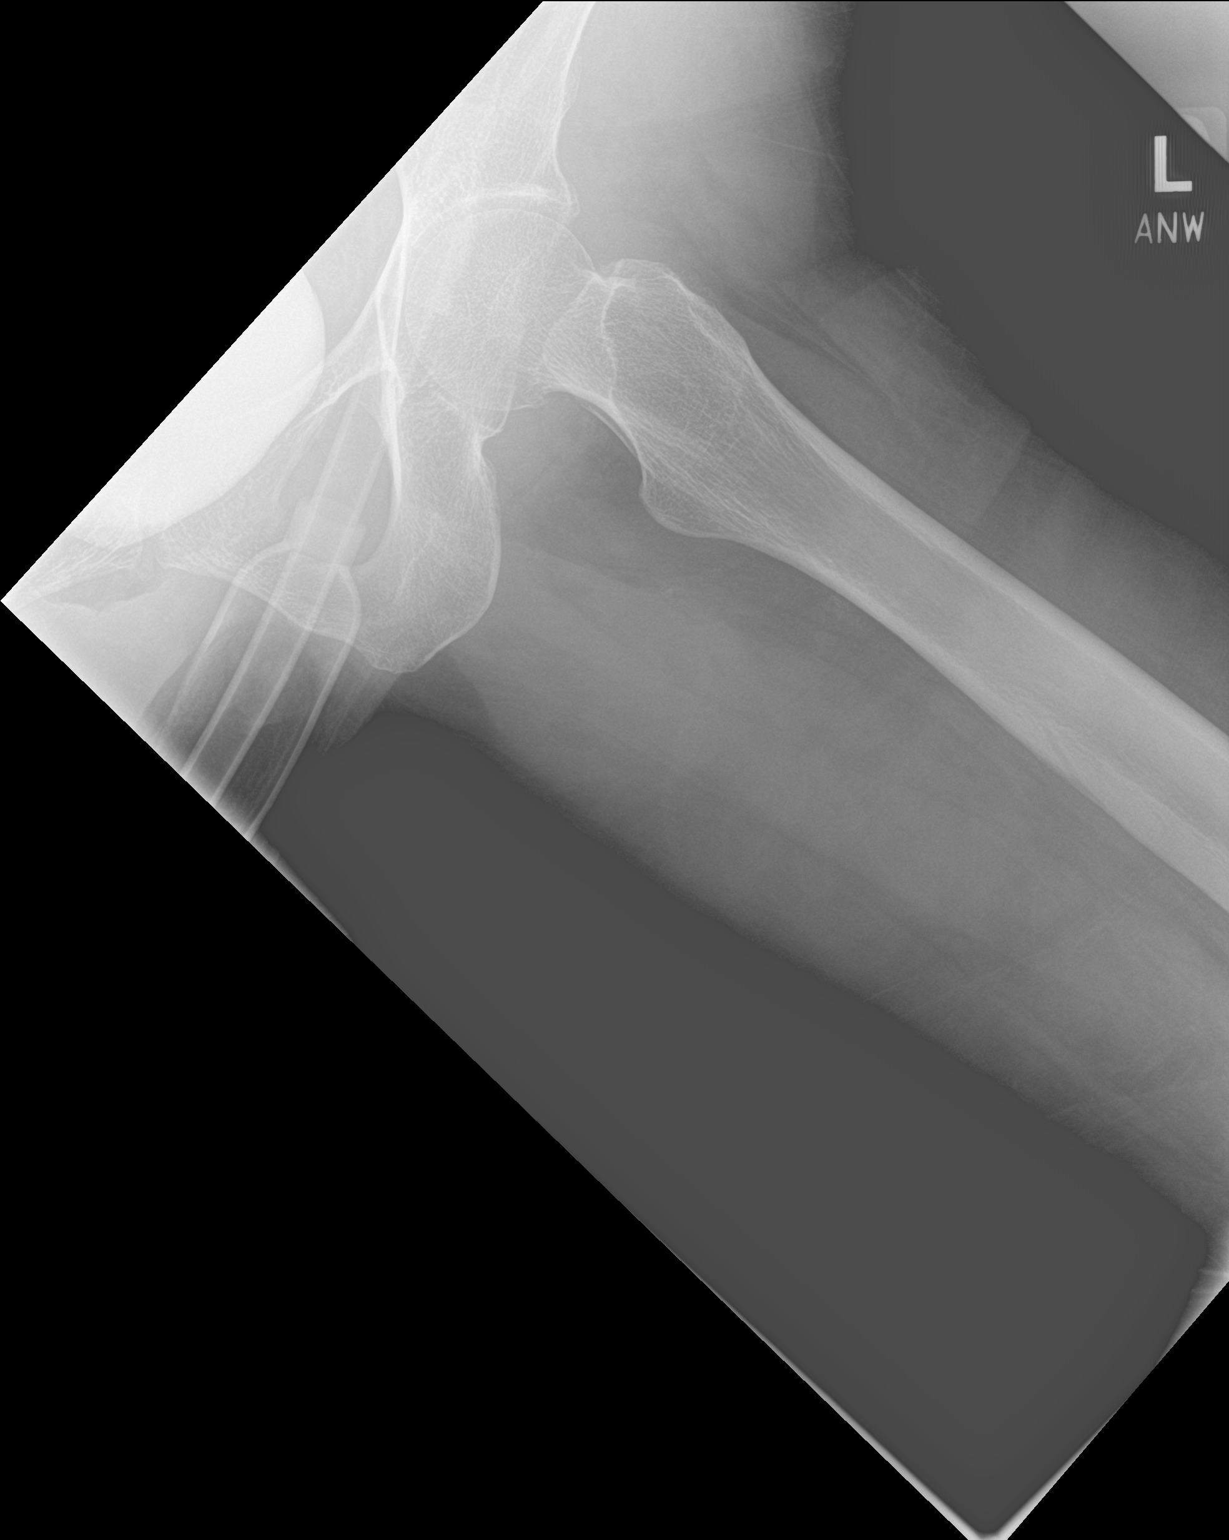

[2 of 2 positions shown; findings below may reference images not displayed]

FINDINGS: There is no evidence of hip fracture or dislocation. There is no
evidence of arthropathy or other focal bone abnormality. Enteric
contrast in the bladder and dilated right ureter, see preceding CT.
IMPRESSION: 1. Negative pelvis and hips.
2. Right hydroureter correlating with right ureteral stone on
preceding CT.

## 2020-05-08 IMAGING — MR MR ABDOMEN WO/W CM
11 of 19 series · 19 of 48 positions shown · IV contrast (6 GAD)
Comparison: CT AP [DATE].

CLINICAL DATA: Evaluate liver lesions.  History of breast cancer.

EXAM:
MRI ABDOMEN WITHOUT AND WITH CONTRAST
TECHNIQUE: Multiplanar multisequence MR imaging of the abdomen was performed
both before and after the administration of intravenous contrast.
CONTRAST:  6mL GADAVIST GADOBUTROL 1 MMOL/ML IV SOLN

[Series 3: cor ssfse nav · coronal · 6.0mm · 0.78mm/px · 2 of 30 slices shown]
[im 1/30]
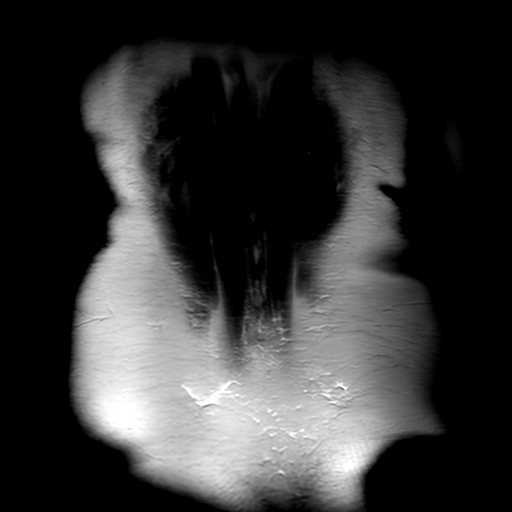
[im 30/30]
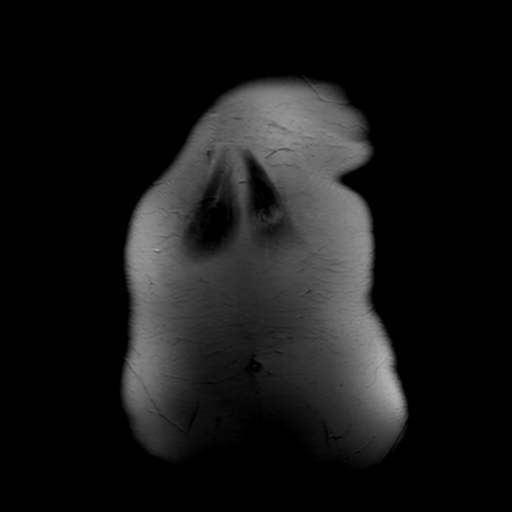

[Series 4: ax ssfse nav · axial · 6.0mm · 0.74mm/px · 1 of 39 slices shown]
[im 1/39]
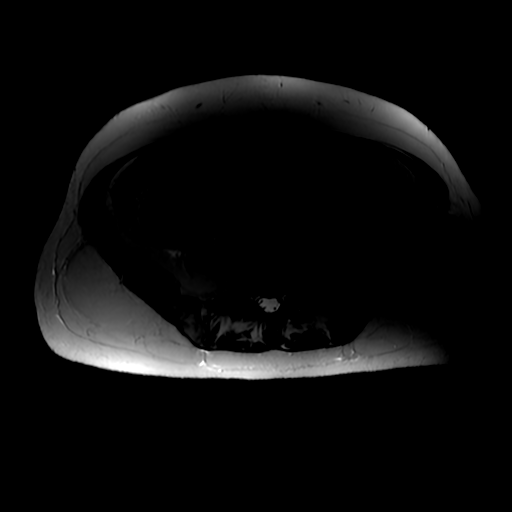

[Series 5: T2 fat-sat · axial · 6.0mm · 0.74mm/px · 1 of 35 slices shown]
[im 1/35]
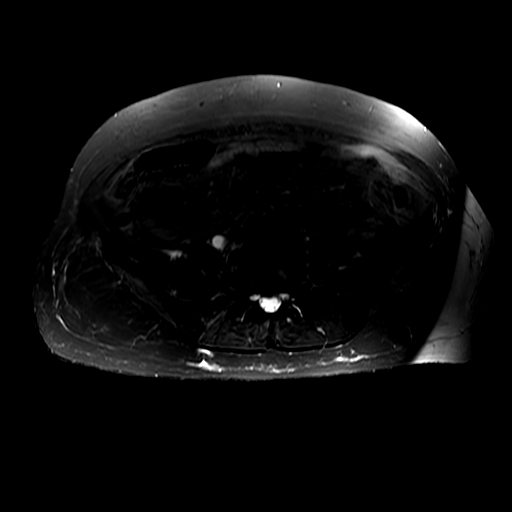

[Series 6: DWI b500 · axial · 8.0mm · 1.76mm/px · 1 of 46 slices shown]
[im 1/46]
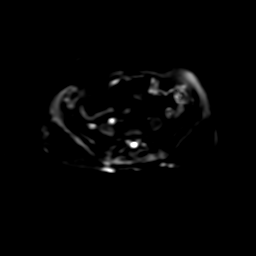

[Series 8: T1 dynamic · axial · 5.0mm · 0.82mm/px · z∈[-102,+136]mm · 2 of 96 slices shown (1 of 3)]
[im 1/96]
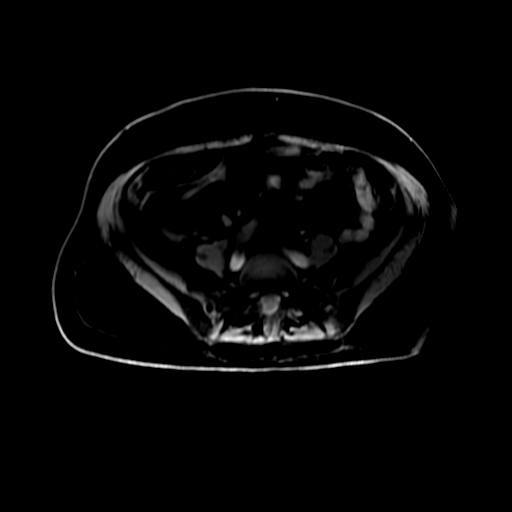
[im 96/96]
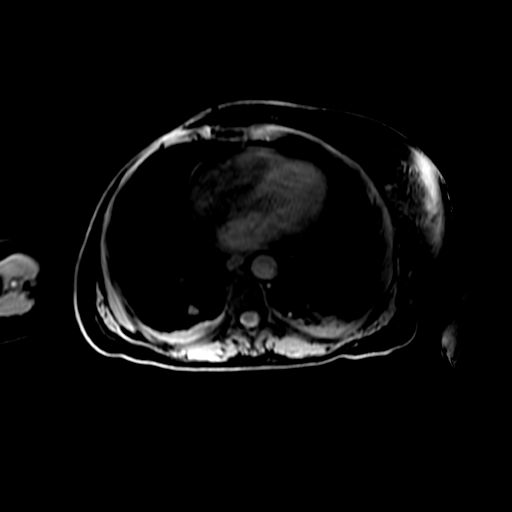

[Series 650: ADC · axial · 8.0mm · 1.76mm/px · 1 of 23 slices shown]
[im 1/23]
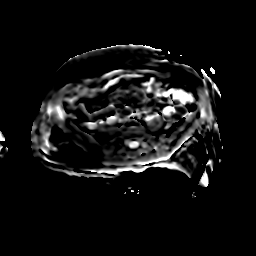

[Series 801: T1 dynamic · axial · 5.0mm · 0.82mm/px · z∈[-102,+136]mm · 2 of 96 slices shown (2 of 3)]
[im 1/96]
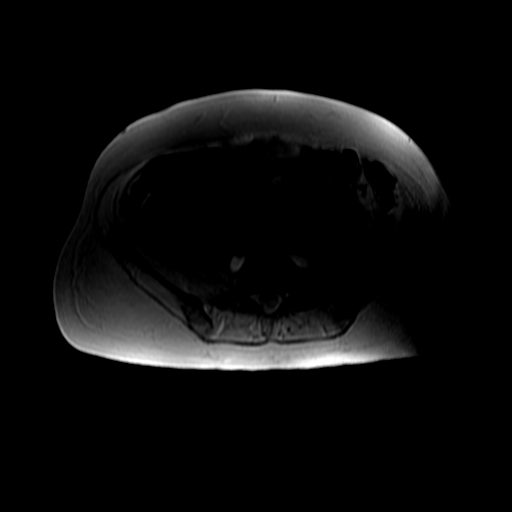
[im 96/96]
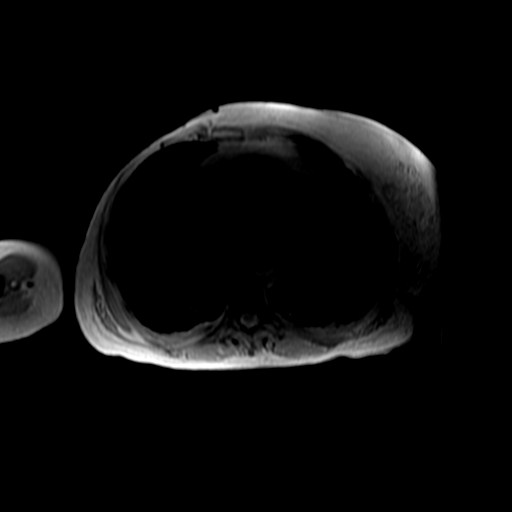

[Series 802: T1 dynamic · axial · 5.0mm · 0.82mm/px · z∈[-102,+136]mm · 2 of 96 slices shown (3 of 3)]
[im 1/96]
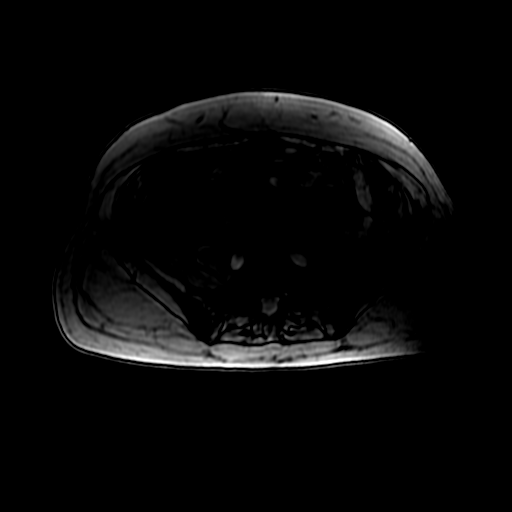
[im 96/96]
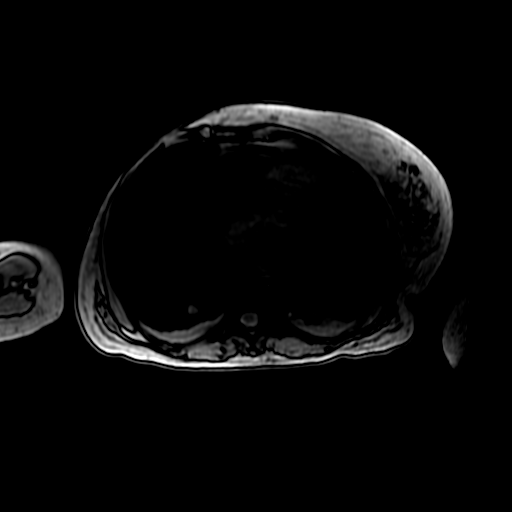

[Series 901: T1 dynamic post-contrast · axial · non-contrast · 4.0mm · 0.86mm/px · z∈[-102,+136]mm · 3 of 120 slices shown (1 of 3)]
[im 1/120]
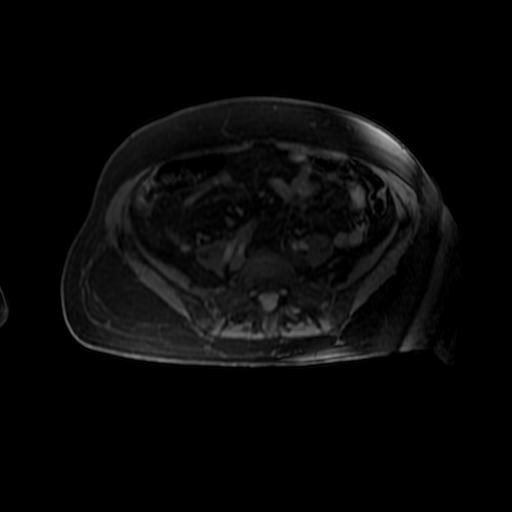
[im 60/120]
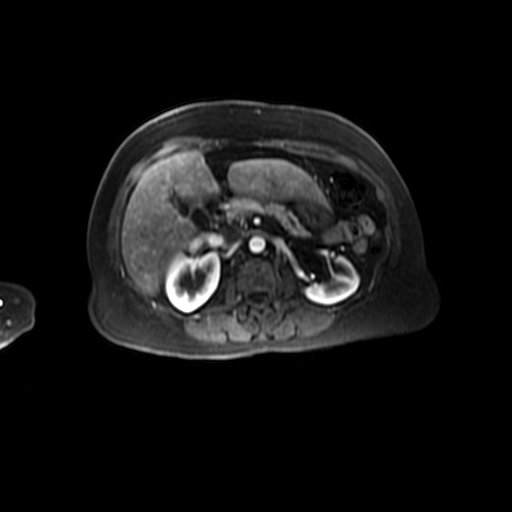
[im 120/120]
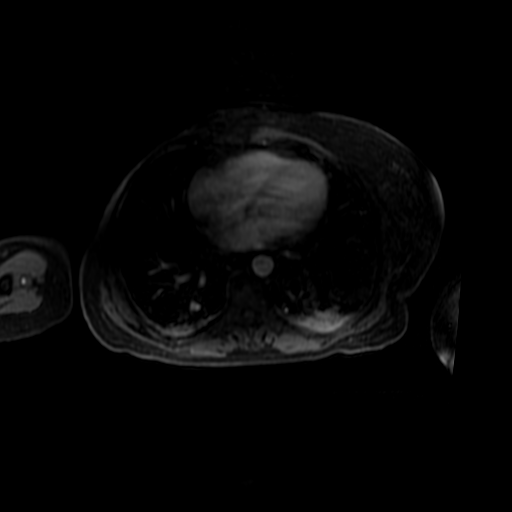

[Series 902: T1 dynamic post-contrast · axial · non-contrast · 4.0mm · 0.86mm/px · z∈[-102,+136]mm · 3 of 120 slices shown (2 of 3)]
[im 1/120]
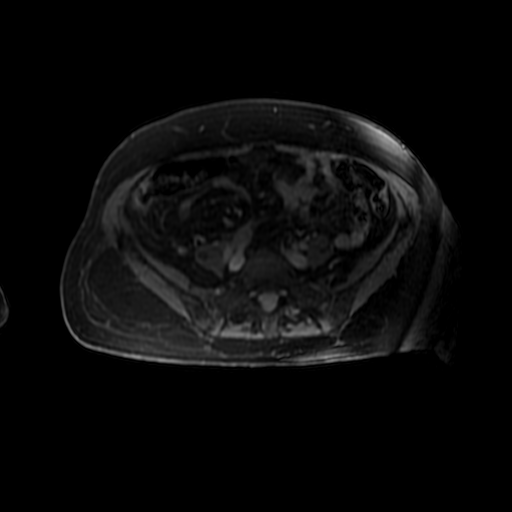
[im 60/120]
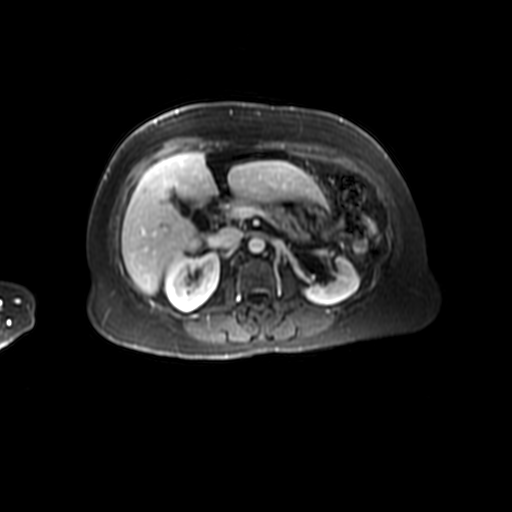
[im 120/120]
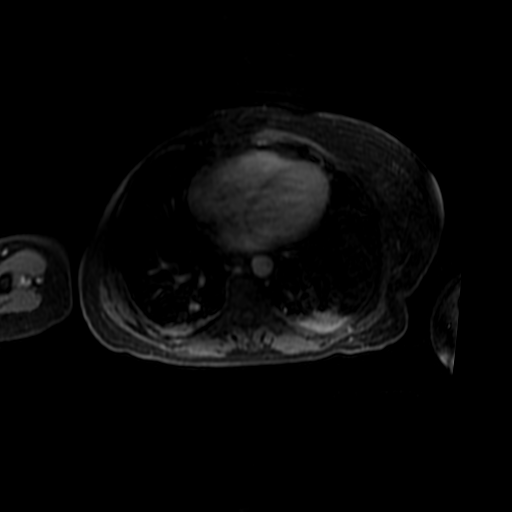

[Series 903: T1 dynamic post-contrast · axial · non-contrast · 4.0mm · 0.86mm/px · 1 of 120 slices shown (3 of 3)]
[im 1/120]
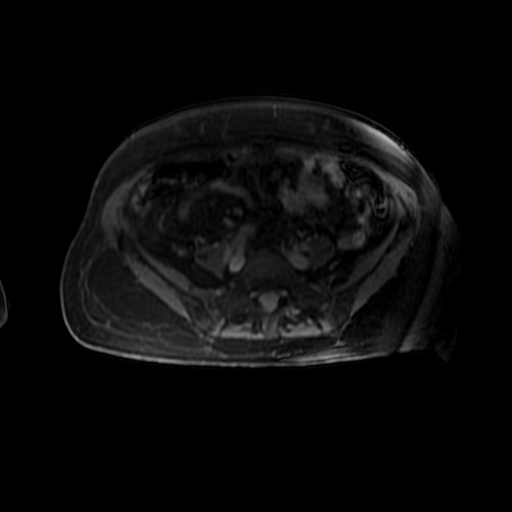

[19 of 48 positions shown; findings below may reference images not displayed]

FINDINGS: Lower chest: New small right pleural effusion. Bilateral lower lobe
atelectasis or airspace disease is new from earlier today.

Hepatobiliary: Respiratory motion artifact diminishes exam detail on
multiple sequences.

Hepatic steatosis noted. Mild diffuse increased periportal T2 signal
suggestive of edema.

Within the lateral segment of left hepatic lobe there is a 1.1 cm
lesion which is mildly T2 hyperintense and T1 hypointense measuring
1.1 cm, image 41/902. On the postcontrast images there is no
internal enhancement associated with this lesion.

Inferior right hepatic lobe lesion measures 5 mm, image 68/904.
There is no enhancement associated with this lesion. This is not
confidently identified on the precontrast T1 weighted images or the
precontrast T2 weighted sequences.

Within the anterior aspect of the left hepatic lobe there is a 4 mm
T1 hypointense and T2 hyperintense structure, image 43/902. No focal
abnormal enhancement is associated with this lesion.

Small stones noted within the dependent portion of the gallbladder.
No significant bile duct dilatation.

Pancreas: No mass, inflammatory changes, or other parenchymal
abnormality identified.

Multiple small scattered cysts are identified within the pancreas.
The largest is in the distal tail of pancreas with a maximum
diameter 1.7 cm, image 80/7. No ductal dilatation associated with
these lesions.

Spleen:  Within normal limits in size and appearance.

Adrenals/Urinary Tract: Normal appearance of the adrenal glands.
Right-sided hydronephrosis is again identified secondary to
previously noted distal right ureteral calculus. Small cyst is noted
within the upper pole of right kidney measuring 6 mm, image [DATE].
There is diffuse perinephric edema and fluid. Left kidney inferior
pole parapelvic cysts noted.

Stomach/Bowel: Stomach is normal. There is no dilated loops of small
or large bowel.

Vascular/Lymphatic: No pathologically enlarged lymph nodes
identified. No abdominal aortic aneurysm demonstrated.

Other:  None.

Musculoskeletal: No suspicious bone lesions identified.
IMPRESSION: 1. There are at least 3 nonenhancing liver lesions corresponding to
the CT abnormality. No abnormal enhancement associated with these
lesions which are favored to represent benign cysts.
2. Hepatic steatosis.
3. Multiple small scattered cysts within the pancreas. The largest
is in the distal tail of pancreas measuring 1.7 cm. Interval
surveillance imaging is recommended. Initial follow-up examination
should be obtained in 6 months with repeat MRI without and with
contrast material. This recommendation follows ACR consensus
guidelines: Management of Incidental Pancreatic Cysts: A White Paper
of the ACR Incidental Findings Committee. [HOSPITAL]
[HJ];[DATE].
4. Persistent right-sided hydronephrosis, hydroureter and
perinephric fat stranding secondary to distal right ureteral
calculus.

## 2020-05-08 IMAGING — CT CT ABD-PELV W/ CM
2 of 5 series · 15 of 46 positions shown, 17 images · IV contrast (omnipaque)
Comparison: None.

CLINICAL DATA: Right lower quadrant abdominal pain. Low back pain.
Vomiting. History of kidney stones and breast cancer.

EXAM:
CT ABDOMEN AND PELVIS WITH CONTRAST
TECHNIQUE: Multidetector CT imaging of the abdomen and pelvis was performed
using the standard protocol following bolus administration of
intravenous contrast.
CONTRAST:  100mL OMNIPAQUE IOHEXOL 300 MG/ML  SOLN

[Series 3: a/p w/ 5mm · axial · 0.73mm/px · z∈[+845,+1210]mm · 12 of 83 slices shown, 14 images]
[im 5/83  soft-tissue]
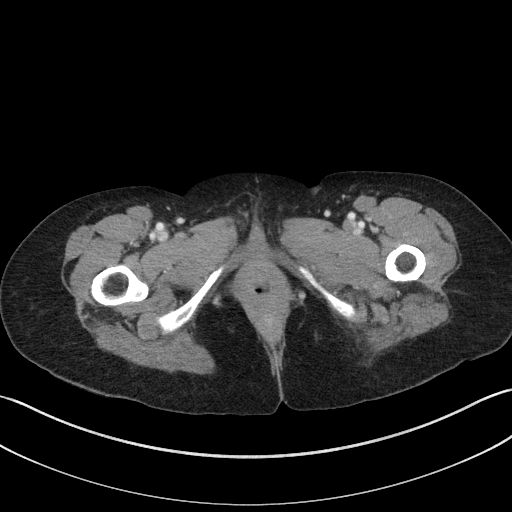
[im 5/83  bone]
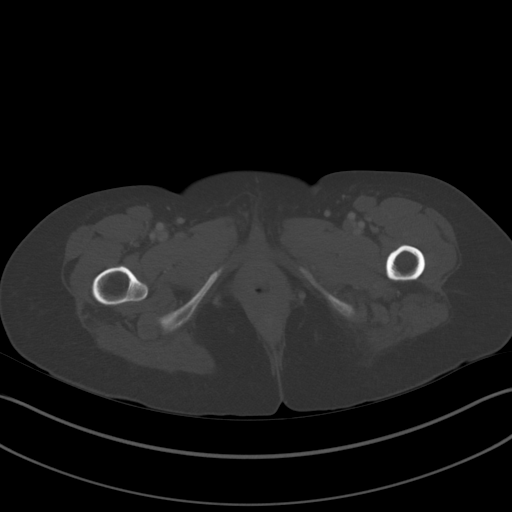
[im 13/83  soft-tissue]
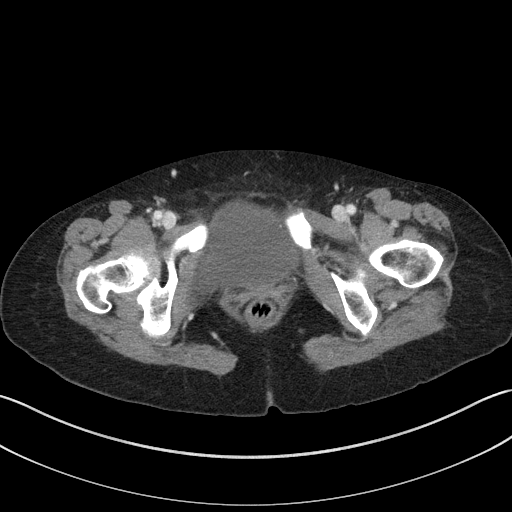
[im 18/83  soft-tissue]
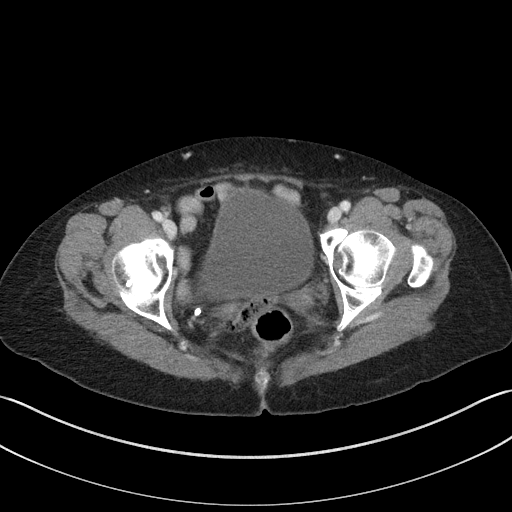
[im 26/83  soft-tissue]
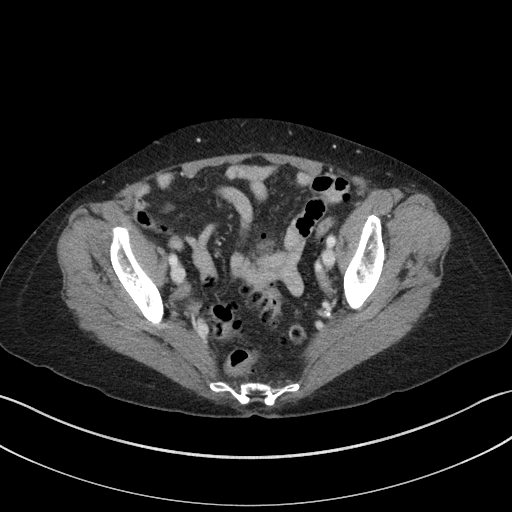
[im 31/83  soft-tissue]
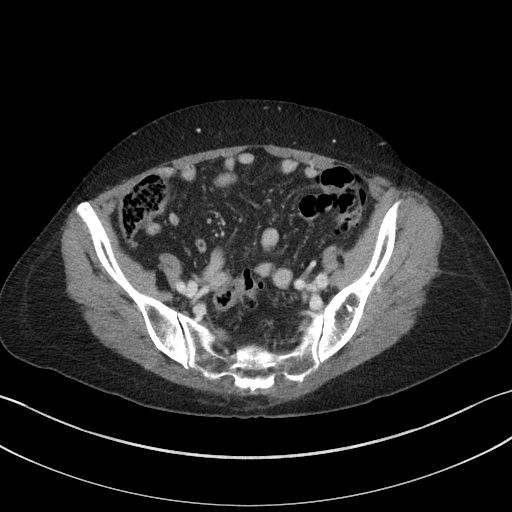
[im 39/83  soft-tissue]
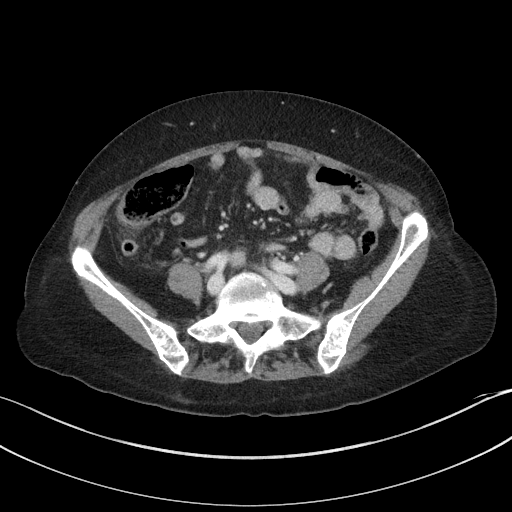
[im 44/83  soft-tissue]
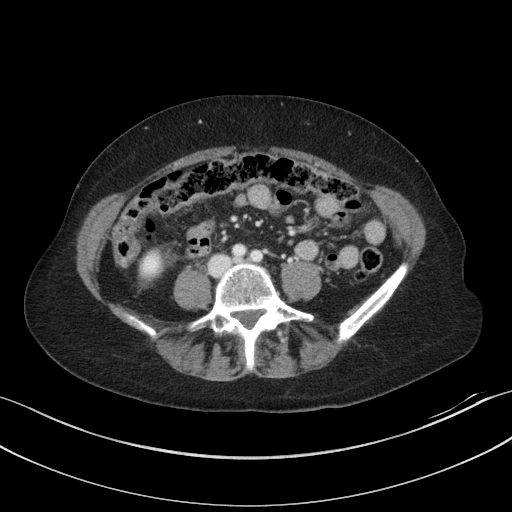
[im 52/83  soft-tissue]
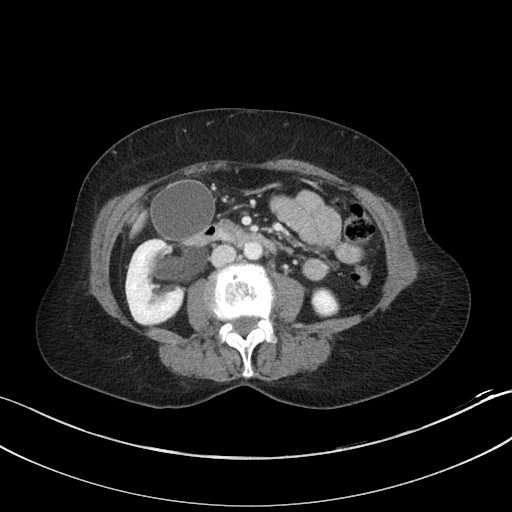
[im 57/83  soft-tissue]
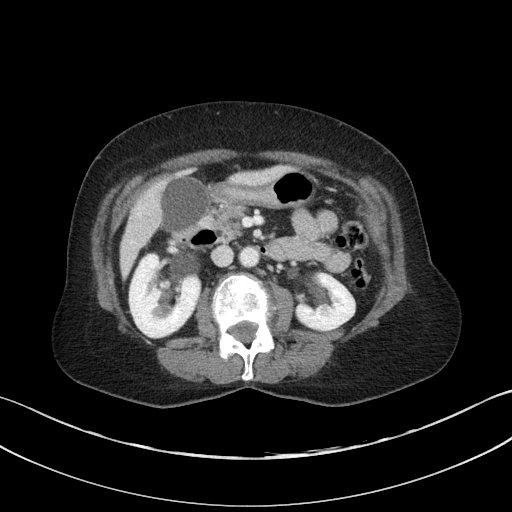
[im 57/83  bone]
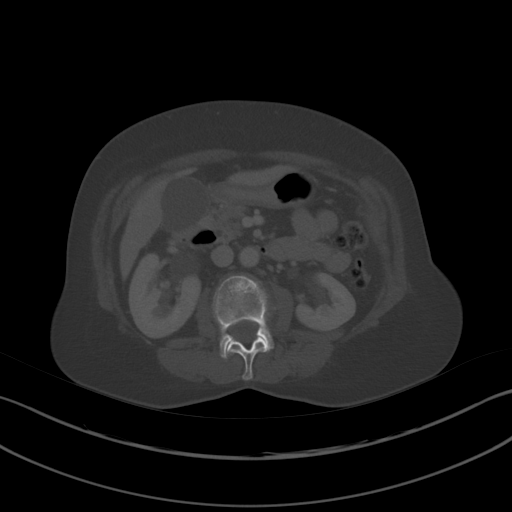
[im 65/83  soft-tissue]
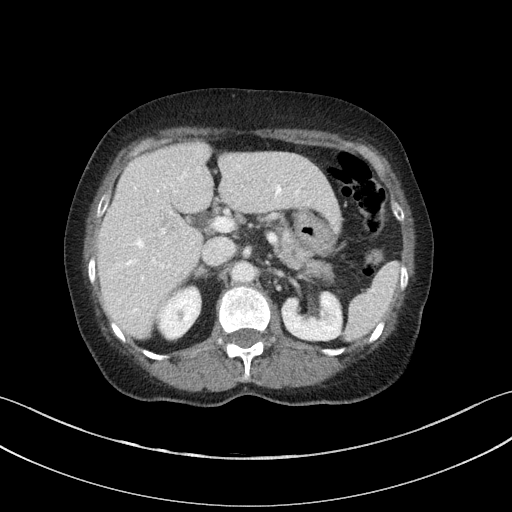
[im 70/83  soft-tissue]
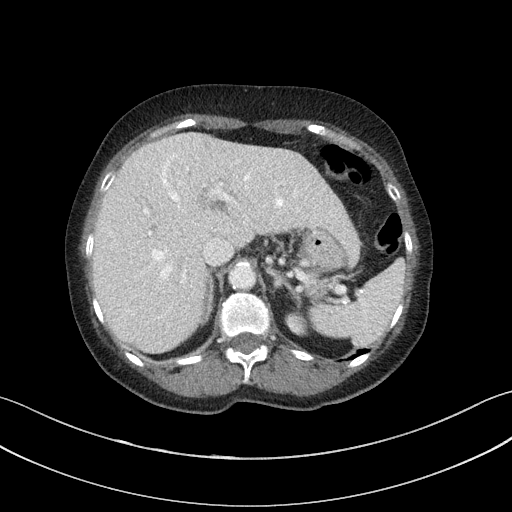
[im 78/83  soft-tissue]
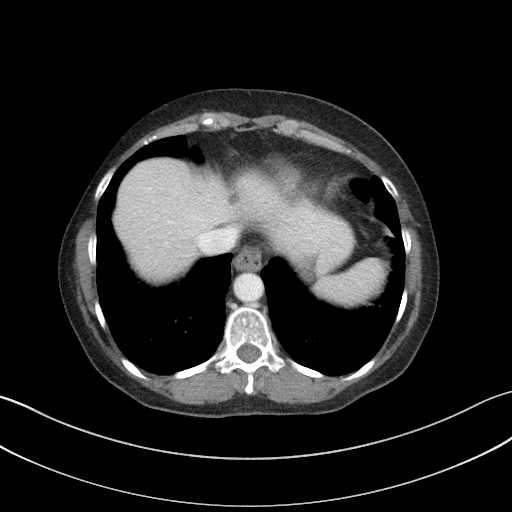

[Series 6: a/p w/ cor · coronal · 0.72mm/px · 3 of 117 slices shown]
[im 39/117  soft-tissue]
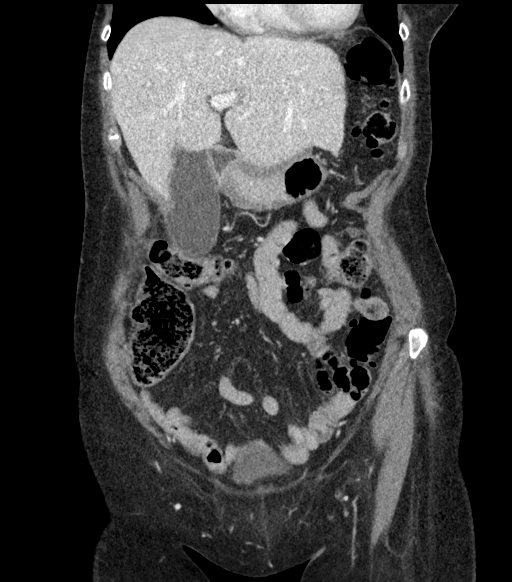
[im 52/117  soft-tissue]
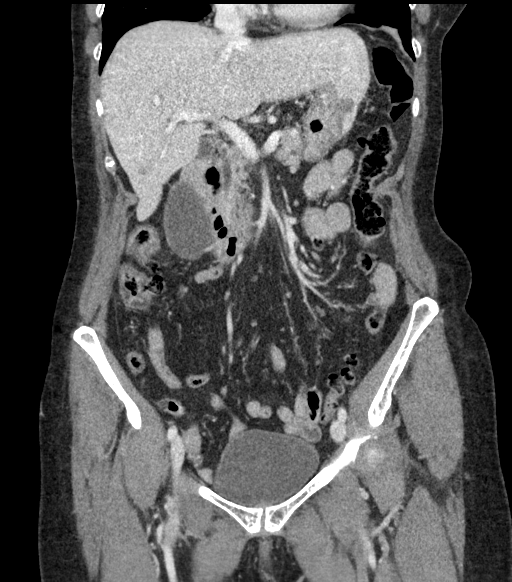
[im 65/117  soft-tissue]
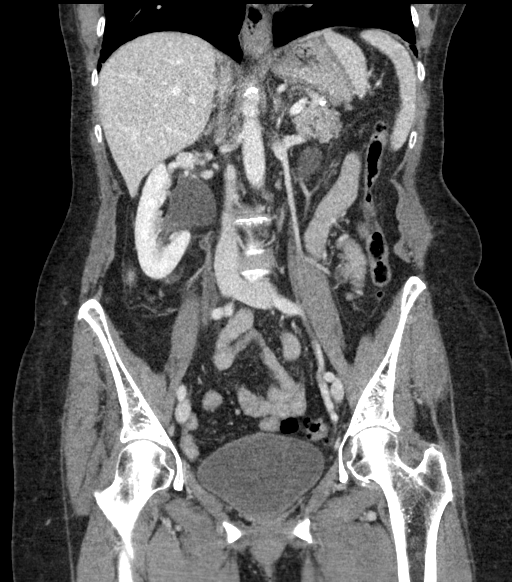

[15 of 46 positions shown; findings below may reference images not displayed]

FINDINGS: Lower chest: Minor linear atelectasis in both lower lobes. No
pleural fluid.

Hepatobiliary: There is an 11 x 11 mm low-density involving the far
left lobe of the liver, series 3, image 15. Question of additional
vague hypodensity in the inferior right lobe, series 3, image 25.
Subcapsular lesion in the hepatic dome series 3, image 6,
subcentimeter. Mild gallbladder distension but no evidence of
calcified gallstone or pericholecystic inflammation. No significant
biliary dilatation.

Pancreas: 12 mm low-density lesion in the pancreatic tail, series 3,
image 17. No ductal dilatation or inflammation.

Spleen: Normal in size without focal abnormality.

Adrenals/Urinary Tract: Normal adrenal glands. 6 mm calcification in
the right pelvis, difficult to differentiate within or adjacent to
the right ureter. There is dilatation of both renal pelvises, more
prominent on the right as well as mild right ureteral dilatation.
Punctate nonobstructing stone in the upper right kidney. There is no
significant perinephric edema. Slightly diminished excretion from
the right renal collecting system on delayed phase imaging. Tiny
cortical hypodensities in the left kidney are too small to
characterize but likely small cysts. Urinary bladder is
unremarkable.

Stomach/Bowel: Small hiatal hernia. The stomach is decompressed. No
small bowel dilatation, obstruction or inflammation. The appendix is
tentatively but not definitively identified, there is no pericecal
or right lower quadrant inflammation. Colonic diverticulosis
involving the descending and sigmoid colon. The sigmoid colon is
redundant. Questionable area of colonic wall thickening and
pericolonic edema involving the redundant sigmoid colon, series 3,
image 55.

Vascular/Lymphatic: Aortic atherosclerosis. No aortic aneurysm.
Patent portal vein. No enlarged lymph nodes in the abdomen or
pelvis.

Reproductive: Status post hysterectomy. No adnexal masses.

Other: No free air, free fluid, or intra-abdominal fluid collection.

Musculoskeletal: Degenerative disc disease at L2-L3 and L3-L4. There
are no acute or suspicious osseous abnormalities.
IMPRESSION: 1. Questionable area of colonic wall thickening and pericolonic
edema involving the redundant sigmoid colon. This may represent
short segment colitis or diverticulitis. No perforation.
2. Right pelvic 6 mm calcification, difficult to differentiate if
this is within or adjacent to the right ureter or an adjacent
phlebolith. There is mild right hydroureter and dilatation of the
right renal collecting system without significant perinephric edema.
There is also dilatation of the left renal pelvis suggesting
underlying extrarenal pelvis configuration.
3. Additional nonobstructing right renal stone.
4. Low-density lesions in the liver, largest measuring 11 x 11 mm in
the far left lobe of the liver, nonspecific. Given history of breast
cancer, recommend further evaluation with hepatic protocol MRI.
There is also a 12 mm low-density lesion in the pancreatic tail.
Recommend further characterization with pancreatic protocol MRI on
an elective basis.
5. Small hiatal hernia.

Aortic Atherosclerosis ([GY]-[GY]).

## 2020-05-08 IMAGING — DX DG HIP (WITH OR WITHOUT PELVIS) 1V*R*
2 series · 2 of 2 positions shown · non-contrast
Comparison: Abdomen and pelvis CT from earlier the same day

CLINICAL DATA: Bilateral hip pain with no known injury

EXAM:
DG HIP (WITH OR WITHOUT PELVIS) 1V*L*; DG HIP (WITH OR WITHOUT
PELVIS) 1V RIGHT

[pelvis ap]
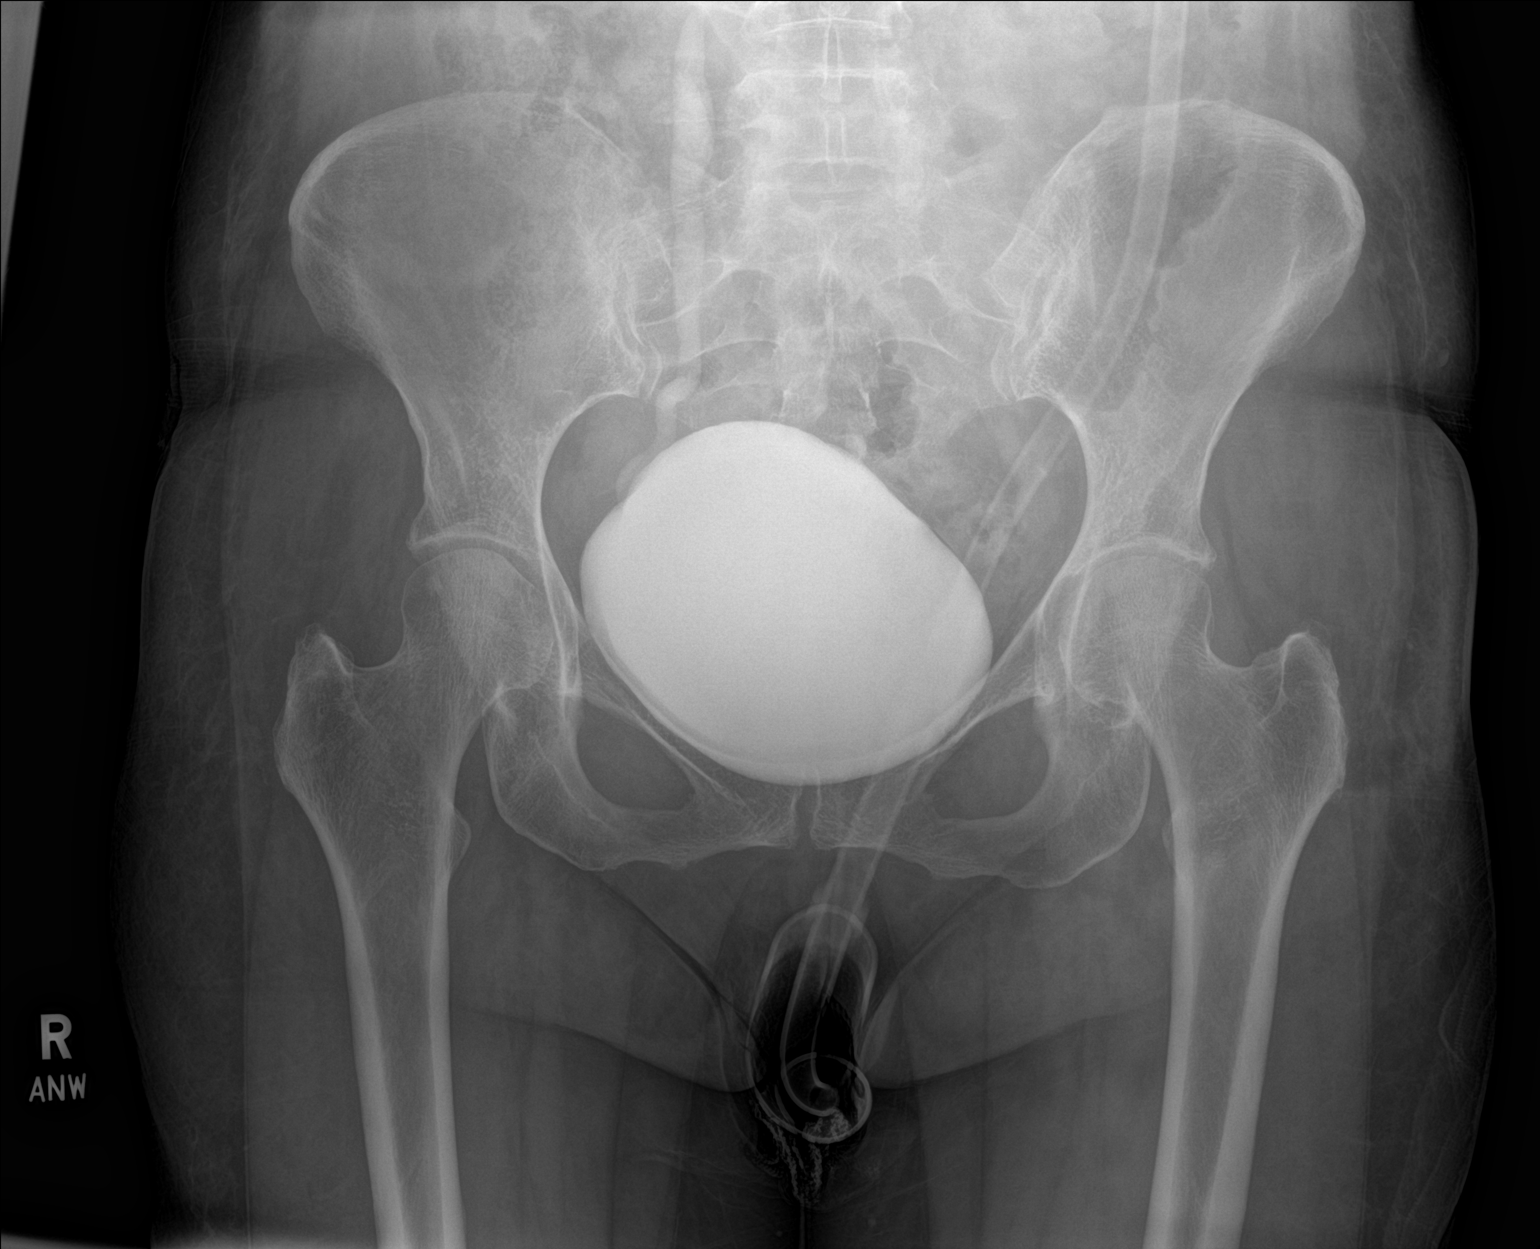

[hip lat]
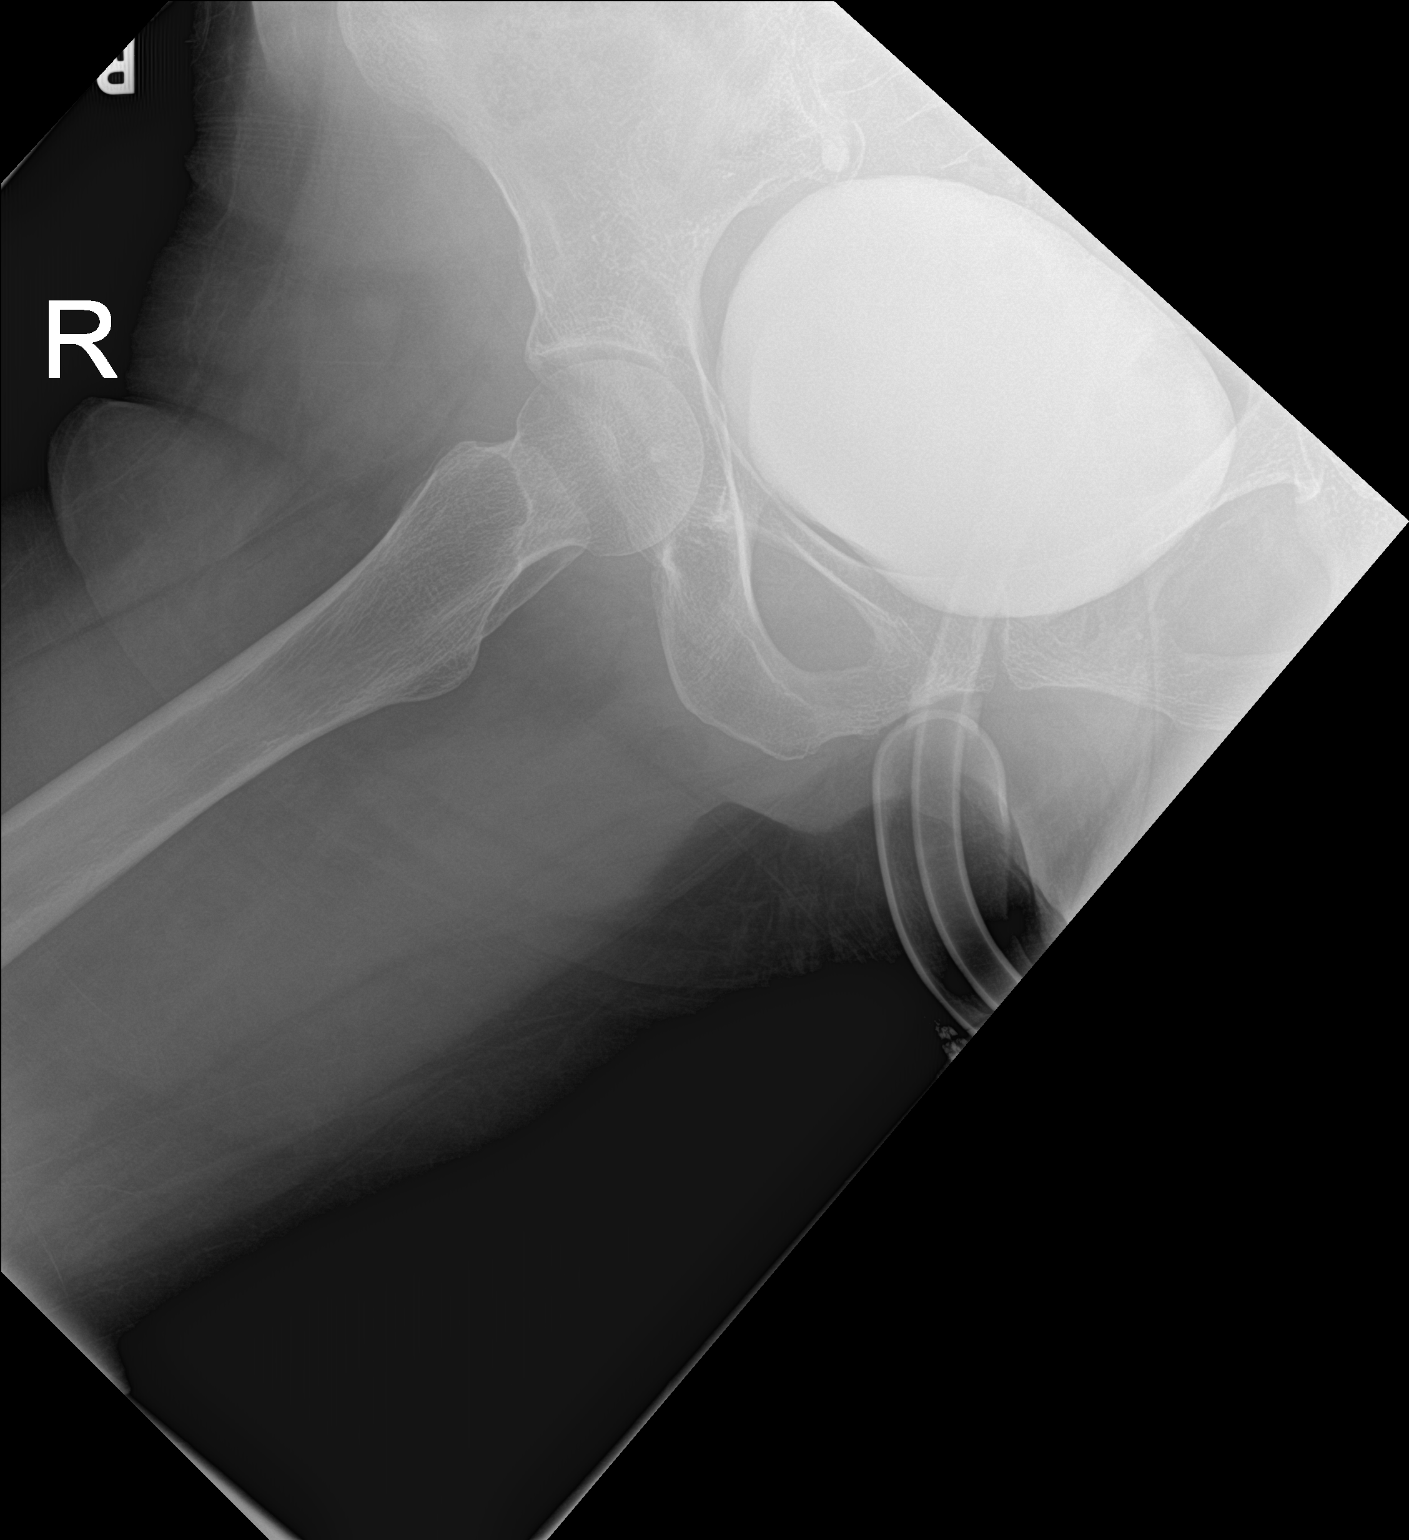

[2 of 2 positions shown; findings below may reference images not displayed]

FINDINGS: There is no evidence of hip fracture or dislocation. There is no
evidence of arthropathy or other focal bone abnormality. Enteric
contrast in the bladder and dilated right ureter, see preceding CT.
IMPRESSION: 1. Negative pelvis and hips.
2. Right hydroureter correlating with right ureteral stone on
preceding CT.

## 2020-05-08 SURGERY — CYSTOSCOPY, WITH STENT INSERTION
Anesthesia: General | Laterality: Right

## 2020-05-08 MED ORDER — IOHEXOL 300 MG/ML  SOLN
100.0000 mL | Freq: Once | INTRAMUSCULAR | Status: AC | PRN
  Administered 2020-05-08: 100 mL via INTRAVENOUS

## 2020-05-08 MED ORDER — METRONIDAZOLE IN NACL 5-0.79 MG/ML-% IV SOLN
500.0000 mg | Freq: Three times a day (TID) | INTRAVENOUS | Status: DC
  Administered 2020-05-08 – 2020-05-09 (×4): 500 mg via INTRAVENOUS
  Filled 2020-05-08 (×4): qty 100

## 2020-05-08 MED ORDER — METOPROLOL SUCCINATE ER 25 MG PO TB24
25.0000 mg | ORAL_TABLET | Freq: Every day | ORAL | Status: DC
  Administered 2020-05-09 – 2020-05-10 (×2): 25 mg via ORAL
  Filled 2020-05-08 (×2): qty 1

## 2020-05-08 MED ORDER — ACETAMINOPHEN 650 MG RE SUPP: 650.0000 mg | Freq: Four times a day (QID) | RECTAL | Status: DC | PRN

## 2020-05-08 MED ORDER — METRONIDAZOLE IN NACL 5-0.79 MG/ML-% IV SOLN
500.0000 mg | Freq: Once | INTRAVENOUS | Status: AC
  Administered 2020-05-08: 500 mg via INTRAVENOUS
  Filled 2020-05-08: qty 100

## 2020-05-08 MED ORDER — MORPHINE SULFATE (PF) 4 MG/ML IV SOLN
4.0000 mg | Freq: Once | INTRAVENOUS | Status: AC
  Administered 2020-05-08: 4 mg via INTRAVENOUS
  Filled 2020-05-08: qty 1

## 2020-05-08 MED ORDER — OXYCODONE HCL 5 MG PO TABS: 5.0000 mg | ORAL_TABLET | ORAL | Status: DC | PRN

## 2020-05-08 MED ORDER — SODIUM CHLORIDE 0.9 % IV SOLN
1.0000 g | INTRAVENOUS | Status: DC
  Administered 2020-05-09 – 2020-05-10 (×2): 1 g via INTRAVENOUS
  Filled 2020-05-08 (×2): qty 1

## 2020-05-08 MED ORDER — ONDANSETRON HCL 4 MG PO TABS: 4.0000 mg | ORAL_TABLET | Freq: Four times a day (QID) | ORAL | Status: DC | PRN

## 2020-05-08 MED ORDER — GADOBUTROL 1 MMOL/ML IV SOLN
6.0000 mL | Freq: Once | INTRAVENOUS | Status: AC | PRN
  Administered 2020-05-08: 6 mL via INTRAVENOUS

## 2020-05-08 MED ORDER — LORAZEPAM 2 MG/ML IJ SOLN
0.5000 mg | Freq: Once | INTRAMUSCULAR | Status: AC
  Administered 2020-05-08: 0.5 mg via INTRAVENOUS
  Filled 2020-05-08: qty 1

## 2020-05-08 MED ORDER — ACETAMINOPHEN 500 MG PO TABS: 1000.0000 mg | ORAL_TABLET | Freq: Four times a day (QID) | ORAL | Status: DC | PRN

## 2020-05-08 MED ORDER — CIPROFLOXACIN IN D5W 400 MG/200ML IV SOLN
400.0000 mg | Freq: Once | INTRAVENOUS | Status: AC
  Administered 2020-05-08: 400 mg via INTRAVENOUS
  Filled 2020-05-08: qty 200

## 2020-05-08 MED ORDER — KETOROLAC TROMETHAMINE 15 MG/ML IJ SOLN: 15.0000 mg | Freq: Four times a day (QID) | INTRAMUSCULAR | Status: DC | PRN

## 2020-05-08 MED ORDER — MORPHINE SULFATE (PF) 4 MG/ML IV SOLN
2.0000 mg | Freq: Once | INTRAVENOUS | Status: AC
  Administered 2020-05-08: 2 mg via INTRAVENOUS
  Filled 2020-05-08: qty 1

## 2020-05-08 MED ORDER — SODIUM CHLORIDE 0.9 % IV BOLUS
1000.0000 mL | Freq: Once | INTRAVENOUS | Status: AC
  Administered 2020-05-08: 1000 mL via INTRAVENOUS

## 2020-05-08 MED ORDER — SODIUM CHLORIDE 0.9 % IV SOLN
1.0000 g | Freq: Once | INTRAVENOUS | Status: AC
  Administered 2020-05-08: 1 g via INTRAVENOUS
  Filled 2020-05-08: qty 10

## 2020-05-08 MED ORDER — HYDROMORPHONE HCL 1 MG/ML IJ SOLN
0.5000 mg | INTRAMUSCULAR | Status: DC | PRN
  Filled 2020-05-08: qty 1

## 2020-05-08 MED ORDER — ONDANSETRON HCL 4 MG/2ML IJ SOLN: 4.0000 mg | Freq: Four times a day (QID) | INTRAMUSCULAR | Status: DC | PRN

## 2020-05-08 MED ORDER — ENOXAPARIN SODIUM 40 MG/0.4ML ~~LOC~~ SOLN: 40.0000 mg | Freq: Every day | SUBCUTANEOUS | Status: DC

## 2020-05-08 MED ORDER — ACETAMINOPHEN 325 MG PO TABS: 650.0000 mg | ORAL_TABLET | Freq: Four times a day (QID) | ORAL | Status: DC | PRN

## 2020-05-08 NOTE — ED Notes (Signed)
Lunch Tray Ordered @ 1003. 

## 2020-05-08 NOTE — Consult Note (Signed)
I have been asked to see the patient by Dr. Marzetta Board, for evaluation and management of right distal ureteral stone.  History of present illness: 78F who presented to the ED with right side and lower quadrant pain.  The pain began the day of her presentation, she was having associated nausea and vomitting.  She denies any fevers.  She denies any dysuria.  She was not having any hematuria. In the ED a CT scan was performed and showed a 87mm stone in the right pelvis in the expected area of the right ureter.  She had mild-moderate hydro.  Her UA demonstrated micro hematuria.  Her vitals were wnl, and her labs were also wnl.  She was admitted for observation/pain control and further eval of the areas around her liver that appeared abnormal on her CT scan.  Since being the hospital the patient's pain is improved.  She is complaining of soreness.  She denies any nausea.  She is not having any dysuria or voiding symptoms.  She has had low BP but her HR was normal.  She has been afebrile.  She had a MRI of her abdomen that showed cysts in the areas of concern on her CT scan.  Review of systems: A 12 point comprehensive review of systems was obtained and is negative unless otherwise stated in the history of present illness.  Patient Active Problem List   Diagnosis Date Noted  . Diverticulitis 05/08/2020  . Pain in joint of right hip 03/02/2019  . Skin infection 11/25/2017  . Tick bite 11/25/2017  . Flank pain 09/06/2016  . Recurrent nephrolithiasis 08/27/2016  . Personal history of breast cancer 08/27/2016  . Coronary artery disease involving native coronary artery of native heart without angina pectoris 06/28/2015  . Essential hypertension 06/28/2015  . Mixed hyperlipidemia 06/28/2015  . Precordial chest pain 06/28/2015    No current facility-administered medications on file prior to encounter.   Current Outpatient Medications on File Prior to Encounter  Medication Sig Dispense Refill  .  meloxicam (MOBIC) 15 MG tablet Take 1 tablet (15 mg total) by mouth daily. 30 tablet 3  . Metoprolol Succinate 25 MG CS24 Take 25 mg by mouth daily. 90 capsule 1    Past Medical History:  Diagnosis Date  . Breast cancer ([place]) 3785,8850   R breast cancer x 2; lumpectomy with radiation, chemotherapy; mastectomy R.  . Colon polyps   . Hyperlipidemia   . Hypertension   . Nephrolithiasis   . Personal history of chemotherapy   . Personal history of radiation therapy   . Pneumonia     Past Surgical History:  Procedure Laterality Date  . BREAST BIOPSY Left 2016  . BREAST LUMPECTOMY Right 1999  . MASTECTOMY Right    2014  . PARTIAL HYSTERECTOMY  1986   DUB; ovaries intact.    Social History   Tobacco Use  . Smoking status: Never Smoker  . Smokeless tobacco: Never Used  Vaping Use  . Vaping Use: Never used  Substance Use Topics  . Alcohol use: Yes    Alcohol/week: 1.0 standard drink    Types: 1 Glasses of wine per week    Comment: 2-3 times a year  . Drug use: No    Family History  Problem Relation Age of Onset  . Heart disease Mother   . Colon cancer Maternal Grandfather   . Heart attack Maternal Grandfather        or ? stroke  . Colon cancer Maternal Aunt   .  Thyroid disease Son   . Breast cancer Neg Hx     PE: Vitals:   05/08/20 0845 05/08/20 0900 05/08/20 1037 05/08/20 1732  BP: (!) 122/94 (!) 76/35 (!) 92/55 (!) 98/52  Pulse: 91 68 (!) 58 76  Resp: 18  18 18   Temp: 98 F (36.7 C)  98.4 F (36.9 C) 99.2 F (37.3 C)  TempSrc: Oral  Oral Oral  SpO2: 97% 98% 97% 100%  Weight:      Height:       Patient appears to be in no acute distress  patient is alert and oriented x3 Atraumatic normocephalic head No cervical or supraclavicular lymphadenopathy appreciated No increased work of breathing, no audible wheezes/rhonchi Regular sinus rhythm/rate Abdomen is soft, nontender, nondistended, mild RLQ pain and mild right CVA tenderness Lower extremities are  symmetric without appreciable edema Grossly neurologically intact No identifiable skin lesions  Recent Labs    05/07/20 2111  WBC 6.6  HGB 13.1  HCT 40.8   Recent Labs    05/07/20 2111  NA 132*  K 3.7  CL 100  CO2 20*  GLUCOSE 129*  BUN 24*  CREATININE 0.83  CALCIUM 9.4   No results for input(s): LABPT, INR in the last 72 hours. No results for input(s): LABURIN in the last 72 hours. Results for orders placed or performed during the hospital encounter of 05/07/20  Respiratory Panel by RT PCR (Flu A&B, Covid) - Nasopharyngeal Swab     Status: None   Collection Time: 05/08/20  6:16 AM   Specimen: Nasopharyngeal Swab  Result Value Ref Range Status   SARS Coronavirus 2 by RT PCR NEGATIVE NEGATIVE Final    Comment: (NOTE) SARS-CoV-2 target nucleic acids are NOT DETECTED.  The SARS-CoV-2 RNA is generally detectable in upper respiratoy specimens during the acute phase of infection. The lowest concentration of SARS-CoV-2 viral copies this assay can detect is 131 copies/mL. A negative result does not preclude SARS-Cov-2 infection and should not be used as the sole basis for treatment or other patient management decisions. A negative result may occur with  improper specimen collection/handling, submission of specimen other than nasopharyngeal swab, presence of viral mutation(s) within the areas targeted by this assay, and inadequate number of viral copies (<131 copies/mL). A negative result must be combined with clinical observations, patient history, and epidemiological information. The expected result is Negative.  Fact Sheet for Patients:  PinkCheek.be  Fact Sheet for Healthcare Providers:  GravelBags.it  This test is no t yet approved or cleared by the [place] FDA and  has been authorized for detection and/or diagnosis of SARS-CoV-2 by FDA under an Emergency Use Authorization (EUA). This EUA will remain   in effect (meaning this test can be used) for the duration of the COVID-19 declaration under Section 564(b)(1) of the Act, 21 U.S.C. section 360bbb-3(b)(1), unless the authorization is terminated or revoked sooner.     Influenza A by PCR NEGATIVE NEGATIVE Final   Influenza B by PCR NEGATIVE NEGATIVE Final    Comment: (NOTE) The Xpert Xpress SARS-CoV-2/FLU/RSV assay is intended as an aid in  the diagnosis of influenza from Nasopharyngeal swab specimens and  should not be used as a sole basis for treatment. Nasal washings and  aspirates are unacceptable for Xpert Xpress SARS-CoV-2/FLU/RSV  testing.  Fact Sheet for Patients: PinkCheek.be  Fact Sheet for Healthcare Providers: GravelBags.it  This test is not yet approved or cleared by the [place] FDA and  has been authorized for detection  and/or diagnosis of SARS-CoV-2 by  FDA under an Emergency Use Authorization (EUA). This EUA will remain  in effect (meaning this test can be used) for the duration of the  Covid-19 declaration under Section 564(b)(1) of the Act, 21  U.S.C. section 360bbb-3(b)(1), unless the authorization is  terminated or revoked. Performed at [facility], [place] [address], [place], [place] [address]     Imaging: I have independently reviewed her CT scan which demonstrated a 70mm stone in the right distal ureter.  There was mild hydro.  Imp: 62mm stone in the right ureter.  Pain seems to have improved.  Her BP has been on the low side, but she has no other evidence of infection/sepsis.    Recommendations:  We initially had her scheduled for stent placement, but she decided to try and manage her stone with medical explusion therapy and f/u as outpatient.  Since her symptoms have improved and she is not septic appearing, I think its reasonable to try and pass the stone.  I would suggest that she be discharged home with pain medication, tamsulosin and  zofran.  She should also strain her urine if possible.   We can have her fu in our clinic in the next 7-10 days for re-eval.   Ardis Hughs

## 2020-05-08 NOTE — H&P (Signed)
History and Physical    Cheyenne Sanders:[id number] DOB: 1951/06/04 DOA: 05/07/2020  I have briefly reviewed the patient's prior medical records in [facility]  PCP: Rutherford Guys, MD (Inactive)  Patient coming from: home  Chief Complaint: abdominal pain  HPI: Cheyenne Sanders is a 69 y.o. 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 Patient reports a similar episode about 2 weeks ago that went away on its own but recurred over the last couple of days to the point that she needed to come to the emergency room. She is also been complaining of nausea and vomiting. She denies any fever or chills.  She reports flank tenderness worse yesterday but some persistent pain today as well.  No chest pain, no shortness of breath, no cough or chest congestion.  No diarrhea.  Pain is quite severe in the ED requiring several doses of morphine.  She reports history of nephrolithiasis  ED Course: In the emergency room she is afebrile, blood pressure is soft in the 90s, she is satting well on room air.  Labs are fairly unremarkable, her WBC is normal, LFTs are normal as well.  She underwent a CT scan of the abdomen and pelvis that showed a questionable area in the sigmoid colon potentially suggesting diverticulitis, but also did show a 6 mm calcification in the right pelvis difficult to differentiate if this is within or next to the right ureter, there are also some mild right hydroureter and dilatation of the right renal colic system without significant perinephric edema.  CT scan also picked up some low density lesions in the liver and pancreas,  nonspecific.  Review of Systems: All systems reviewed, and apart from HPI, all negative  Past Medical History:  Diagnosis Date  . Breast cancer ([place]) [id number]   R breast cancer x 2; lumpectomy with radiation, chemotherapy; mastectomy R.  . Colon polyps   . Hyperlipidemia   . Hypertension   . Nephrolithiasis   . Personal history of chemotherapy   . Personal history of radiation therapy   . Pneumonia     Past Surgical History:  Procedure Laterality Date  . BREAST BIOPSY Left 2016  . BREAST LUMPECTOMY Right 1999  . MASTECTOMY Right    2014  . PARTIAL HYSTERECTOMY  1986   DUB; ovaries intact.     reports that she has never smoked. She has never used smokeless tobacco. She reports current alcohol use of about 1.0 standard drink of alcohol per week. She reports that she does not use drugs.  Allergies  Allergen Reactions  . Sublimaze [Fentanyl] Shortness Of Breath    Family History  Problem Relation Age of Onset  . Heart disease Mother   . Colon cancer Maternal Grandfather   . Heart attack Maternal Grandfather        or ? stroke  . Colon cancer Maternal Aunt   . Thyroid disease Son   . Breast cancer Neg Hx     Prior to Admission medications   Medication Sig Start Date End Date Taking? Authorizing Provider  meloxicam (MOBIC) 15 MG tablet Take 1 tablet (15 mg total) by mouth daily. 04/26/20  Yes Hyatt, Max T, DPM  Metoprolol Succinate 25 MG CS24 Take  25 mg by mouth daily. 04/21/20  Yes Rutherford Guys, MD    Physical Exam: Vitals:   05/08/20 0300 05/08/20 0400 05/08/20 0500 05/08/20 0600  BP: 105/60 (!) 91/57 (!) 96/52 (!) 97/52  Pulse: 90 86 87 87  Resp: 16 16 16 16   Temp:      TempSrc:      SpO2: 96% 95% 95% 94%  Weight:      Height:          Constitutional: Appears uncomfortable Eyes: PERRL, lids and conjunctivae normal ENMT: Mucous membranes are moist. Neck: normal, supple Respiratory: clear to auscultation bilaterally, no wheezing, no crackles.  Normal respiratory effort. No accessory muscle use.  Cardiovascular: Regular rate and rhythm, no murmurs / rubs / gallops. No extremity edema. 2+ pedal pulses.  Abdomen: Diffuse tenderness palpation of the right lower quadrant, positive for CVA tenderness, no guarding or rebound Musculoskeletal: no clubbing / cyanosis. Normal muscle tone.  Skin: no rashes, lesions, ulcers. No induration Neurologic: CN 2-12 grossly intact. Strength 5/5 in all 4.  Psychiatric: Normal judgment and insight. Alert and oriented x 3. Normal mood.   Labs on Admission: I have personally reviewed following labs and imaging studies  CBC: Recent Labs  Lab 05/07/20 2111  WBC 6.6  HGB 13.1  HCT 40.8  MCV 96.0  PLT 161   Basic Metabolic Panel: Recent Labs  Lab 05/07/20 2111  NA 132*  K 3.7  CL 100  CO2 20*  GLUCOSE 129*  BUN 24*  CREATININE 0.83  CALCIUM 9.4   Liver Function Tests: Recent Labs  Lab 05/07/20 2111  AST 38  ALT 41  ALKPHOS 67  BILITOT 1.0  PROT 6.6  ALBUMIN 3.9   Coagulation Profile: No results for input(s): INR, PROTIME in the last 168 hours. BNP (last 3 results) No results for input(s): PROBNP in the last 8760 hours. CBG: No results for input(s): GLUCAP in the last 168 hours. Thyroid Function Tests: No results for input(s): TSH, T4TOTAL, FREET4, T3FREE, THYROIDAB in the last 72 hours. Urine analysis:    Component Value Date/Time   COLORURINE YELLOW 05/08/2020 0100   APPEARANCEUR CLEAR 05/08/2020 0100   APPEARANCEUR Clear 04/21/2020 1557   LABSPEC 1.028 05/08/2020 0100   PHURINE 5.0 05/08/2020 0100   GLUCOSEU NEGATIVE 05/08/2020 0100   HGBUR LARGE (A) 05/08/2020 0100   BILIRUBINUR NEGATIVE 05/08/2020 0100   BILIRUBINUR Negative 04/21/2020 1557   KETONESUR 5 (A) 05/08/2020 0100   PROTEINUR NEGATIVE 05/08/2020 0100   UROBILINOGEN 0.2 02/25/2019 1622   NITRITE POSITIVE (A) 05/08/2020 0100   LEUKOCYTESUR TRACE (A) 05/08/2020 0100     Radiological Exams on  Admission: CT ABDOMEN PELVIS W CONTRAST  Result Date: 05/08/2020 CLINICAL DATA:  Right lower quadrant abdominal pain. Low back pain. Vomiting. History of kidney stones and breast cancer. EXAM: CT ABDOMEN AND PELVIS WITH CONTRAST TECHNIQUE: Multidetector CT imaging of the abdomen and pelvis was performed using the standard protocol following bolus administration of intravenous contrast. CONTRAST:  135mL OMNIPAQUE IOHEXOL 300 MG/ML  SOLN COMPARISON:  None. FINDINGS: Lower chest: Minor linear atelectasis in both lower lobes. No pleural fluid. Hepatobiliary: There is an 11 x 11 mm low-density involving the far left lobe of the liver, series 3, image 15. Question of additional vague hypodensity in the inferior right lobe, series 3, image 25. Subcapsular lesion in the hepatic dome series 3, image 6, subcentimeter. Mild gallbladder distension but no evidence of calcified gallstone or pericholecystic inflammation. No significant biliary dilatation. Pancreas:  12 mm low-density lesion in the pancreatic tail, series 3, image 17. No ductal dilatation or inflammation. Spleen: Normal in size without focal abnormality. Adrenals/Urinary Tract: Normal adrenal glands. 6 mm calcification in the right pelvis, difficult to differentiate within or adjacent to the right ureter. There is dilatation of both renal pelvises, more prominent on the right as well as mild right ureteral dilatation. Punctate nonobstructing stone in the upper right kidney. There is no significant perinephric edema. Slightly diminished excretion from the right renal collecting system on delayed phase imaging. Tiny cortical hypodensities in the left kidney are too small to characterize but likely small cysts. Urinary bladder is unremarkable. Stomach/Bowel: Small hiatal hernia. The stomach is decompressed. No small bowel dilatation, obstruction or inflammation. The appendix is tentatively but not definitively identified, there is no pericecal or right lower  quadrant inflammation. Colonic diverticulosis involving the descending and sigmoid colon. The sigmoid colon is redundant. Questionable area of colonic wall thickening and pericolonic edema involving the redundant sigmoid colon, series 3, image 55. Vascular/Lymphatic: Aortic atherosclerosis. No aortic aneurysm. Patent portal vein. No enlarged lymph nodes in the abdomen or pelvis. Reproductive: Status post hysterectomy. No adnexal masses. Other: No free air, free fluid, or intra-abdominal fluid collection. 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 There are no acute or suspicious osseous abnormalities. IMPRESSION: 1. Questionable area of colonic wall thickening and pericolonic edema involving the redundant sigmoid colon. This may represent short segment colitis or diverticulitis. No perforation. 2. Right pelvic 6 mm calcification, difficult to differentiate if this is within or adjacent to the right ureter or an adjacent phlebolith. There is mild right hydroureter and dilatation of the right renal collecting system without significant perinephric edema. There is also dilatation of the left renal pelvis suggesting underlying extrarenal pelvis configuration. 3. Additional nonobstructing right renal stone. 4. Low-density lesions in the liver, largest measuring 11 x 11 mm in the far left lobe of the liver, nonspecific. Given history of breast cancer, recommend further evaluation with hepatic protocol MRI. There is also a 12 mm low-density lesion in the pancreatic tail. Recommend further characterization with pancreatic protocol MRI on an elective basis. 5. Small hiatal hernia. Aortic Atherosclerosis (ICD10-I70.0). Electronically Signed   By: Keith Rake M.D.   On: 05/08/2020 00:45    EKG: Independently reviewed.  Sinus rhythm  Assessment/Plan  Principal Problem Right lower quadrant pain, concern for obstructive nephrolithiasis with mild hydroureter and right hydronephrosis-discussed case  with urology Dr. Louis Meckel, there is a clinical concern that she has an obstructive stone on the right side, he plans to take her to the OR for stenting this afternoon -Urinalysis does show evidence of blood -Cover with ceftriaxone, urine cultures pending  Active Problems History of breast cancer -CT scan with concerning nonspecific lesions in the liver and pancreas, obtain MRI of the abdomen to further delineate the CT findings  ?  Diverticulitis -keep on ceftriaxone and metronidazole, she does not have any pain on the left side around the sigmoid colon, clinically does not appear that this is causing her right lower quadrant pain  Essential hypertension -hold metoprolol today, resume tomorrow   DVT prophylaxis: SCD  Code Status: Full code  Family Communication: no family at bedside  Disposition Plan: home when ready  Bed Type: Medsurg Consults called: Urology, Dr Louis Meckel  Obs/Inp: Obs   Marzetta Board, MD, PhD [facility]  Contact via [email or web address]  05/08/2020, 7:03 AM

## 2020-05-08 NOTE — ED Notes (Addendum)
Pt with excruciating pain in abdomen and bilateral legs. Pt also has low BP. Messaged doctor.  Update: Normal saline ordered. Dilaudid was discontinued. Pt currently sleeping and comfortable. Will re assess pain when she wakes up.

## 2020-05-08 NOTE — Anesthesia Preprocedure Evaluation (Deleted)
Anesthesia Evaluation  Patient identified by MRN, date of birth, ID band Patient awake    Reviewed: Allergy & Precautions, NPO status , Patient's Chart, lab work & pertinent test results  Airway Mallampati: II  TM Distance: >3 FB Neck ROM: Full    Dental no notable dental hx.    Pulmonary neg pulmonary ROS,    Pulmonary exam normal breath sounds clear to auscultation       Cardiovascular hypertension, Pt. on home beta blockers and Pt. on medications Normal cardiovascular exam Rhythm:Regular Rate:Normal  Normal echo 1/21   Neuro/Psych negative neurological ROS  negative psych ROS   GI/Hepatic negative GI ROS, Neg liver ROS,   Endo/Other  negative endocrine ROS  Renal/GU negative Renal ROS  negative genitourinary   Musculoskeletal negative musculoskeletal ROS (+)   Abdominal   Peds negative pediatric ROS (+)  Hematology negative hematology ROS (+)   Anesthesia Other Findings   Reproductive/Obstetrics negative OB ROS                             Anesthesia Physical Anesthesia Plan  ASA: II  Anesthesia Plan: General   Post-op Pain Management:    Induction: Intravenous  PONV Risk Score and Plan: 3 and Ondansetron, Dexamethasone and Treatment may vary due to age or medical condition  Airway Management Planned: LMA  Additional Equipment:   Intra-op Plan:   Post-operative Plan: Extubation in OR  Informed Consent: I have reviewed the patients History and Physical, chart, labs and discussed the procedure including the risks, benefits and alternatives for the proposed anesthesia with the patient or authorized representative who has indicated his/her understanding and acceptance.     Dental advisory given  Plan Discussed with: CRNA and Surgeon  Anesthesia Plan Comments:         Anesthesia Quick Evaluation

## 2020-05-09 ENCOUNTER — Encounter (HOSPITAL_COMMUNITY): Payer: Self-pay | Admitting: Internal Medicine

## 2020-05-09 DIAGNOSIS — K529 Noninfective gastroenteritis and colitis, unspecified: Secondary | ICD-10-CM | POA: Diagnosis present

## 2020-05-09 DIAGNOSIS — E872 Acidosis: Secondary | ICD-10-CM | POA: Diagnosis present

## 2020-05-09 DIAGNOSIS — B9620 Unspecified Escherichia coli [E. coli] as the cause of diseases classified elsewhere: Secondary | ICD-10-CM | POA: Diagnosis present

## 2020-05-09 DIAGNOSIS — R1031 Right lower quadrant pain: Secondary | ICD-10-CM | POA: Diagnosis not present

## 2020-05-09 DIAGNOSIS — Z885 Allergy status to narcotic agent status: Secondary | ICD-10-CM | POA: Diagnosis not present

## 2020-05-09 DIAGNOSIS — N132 Hydronephrosis with renal and ureteral calculous obstruction: Secondary | ICD-10-CM | POA: Diagnosis not present

## 2020-05-09 DIAGNOSIS — Z8701 Personal history of pneumonia (recurrent): Secondary | ICD-10-CM | POA: Diagnosis not present

## 2020-05-09 DIAGNOSIS — Z923 Personal history of irradiation: Secondary | ICD-10-CM | POA: Diagnosis not present

## 2020-05-09 DIAGNOSIS — N390 Urinary tract infection, site not specified: Secondary | ICD-10-CM | POA: Diagnosis not present

## 2020-05-09 DIAGNOSIS — N200 Calculus of kidney: Secondary | ICD-10-CM

## 2020-05-09 DIAGNOSIS — D72825 Bandemia: Secondary | ICD-10-CM | POA: Diagnosis present

## 2020-05-09 DIAGNOSIS — Z87442 Personal history of urinary calculi: Secondary | ICD-10-CM | POA: Diagnosis not present

## 2020-05-09 DIAGNOSIS — Z8249 Family history of ischemic heart disease and other diseases of the circulatory system: Secondary | ICD-10-CM | POA: Diagnosis not present

## 2020-05-09 DIAGNOSIS — Z9221 Personal history of antineoplastic chemotherapy: Secondary | ICD-10-CM | POA: Diagnosis not present

## 2020-05-09 DIAGNOSIS — Z86010 Personal history of colonic polyps: Secondary | ICD-10-CM | POA: Diagnosis not present

## 2020-05-09 DIAGNOSIS — N136 Pyonephrosis: Secondary | ICD-10-CM | POA: Diagnosis present

## 2020-05-09 DIAGNOSIS — K5792 Diverticulitis of intestine, part unspecified, without perforation or abscess without bleeding: Secondary | ICD-10-CM | POA: Diagnosis present

## 2020-05-09 DIAGNOSIS — K869 Disease of pancreas, unspecified: Secondary | ICD-10-CM | POA: Diagnosis not present

## 2020-05-09 DIAGNOSIS — Z853 Personal history of malignant neoplasm of breast: Secondary | ICD-10-CM | POA: Diagnosis not present

## 2020-05-09 DIAGNOSIS — N10 Acute tubulo-interstitial nephritis: Secondary | ICD-10-CM | POA: Diagnosis present

## 2020-05-09 DIAGNOSIS — Z791 Long term (current) use of non-steroidal anti-inflammatories (NSAID): Secondary | ICD-10-CM | POA: Diagnosis not present

## 2020-05-09 DIAGNOSIS — Z79899 Other long term (current) drug therapy: Secondary | ICD-10-CM | POA: Diagnosis not present

## 2020-05-09 DIAGNOSIS — I10 Essential (primary) hypertension: Secondary | ICD-10-CM | POA: Diagnosis present

## 2020-05-09 DIAGNOSIS — E785 Hyperlipidemia, unspecified: Secondary | ICD-10-CM | POA: Diagnosis present

## 2020-05-09 DIAGNOSIS — Z9011 Acquired absence of right breast and nipple: Secondary | ICD-10-CM | POA: Diagnosis not present

## 2020-05-09 DIAGNOSIS — Z20822 Contact with and (suspected) exposure to covid-19: Secondary | ICD-10-CM | POA: Diagnosis present

## 2020-05-09 DIAGNOSIS — N134 Hydroureter: Secondary | ICD-10-CM | POA: Diagnosis not present

## 2020-05-09 DIAGNOSIS — D72829 Elevated white blood cell count, unspecified: Secondary | ICD-10-CM | POA: Diagnosis not present

## 2020-05-09 DIAGNOSIS — K862 Cyst of pancreas: Secondary | ICD-10-CM | POA: Diagnosis present

## 2020-05-09 DIAGNOSIS — Z90711 Acquired absence of uterus with remaining cervical stump: Secondary | ICD-10-CM | POA: Diagnosis not present

## 2020-05-09 LAB — CBC
HCT: 32.8 % — ABNORMAL LOW (ref 36.0–46.0)
HCT: 36.5 % (ref 36.0–46.0)
Hemoglobin: 10.7 g/dL — ABNORMAL LOW (ref 12.0–15.0)
Hemoglobin: 11.5 g/dL — ABNORMAL LOW (ref 12.0–15.0)
MCH: 31.2 pg (ref 26.0–34.0)
MCH: 31.5 pg (ref 26.0–34.0)
MCHC: 32.6 g/dL (ref 30.0–36.0)
MCHC: 31.5 g/dL (ref 30.0–36.0)
MCV: 95.6 fL (ref 80.0–100.0)
MCV: 100 fL (ref 80.0–100.0)
Platelets: 141 10*3/uL — ABNORMAL LOW (ref 150–400)
Platelets: 142 10*3/uL — ABNORMAL LOW (ref 150–400)
RBC: 3.43 MIL/uL — ABNORMAL LOW (ref 3.87–5.11)
RBC: 3.65 MIL/uL — ABNORMAL LOW (ref 3.87–5.11)
RDW: 14.2 % (ref 11.5–15.5)
RDW: 14.1 % (ref 11.5–15.5)
WBC: 17.7 10*3/uL — ABNORMAL HIGH (ref 4.0–10.5)
WBC: 19.2 10*3/uL — ABNORMAL HIGH (ref 4.0–10.5)
nRBC: 0 % (ref 0.0–0.2)
nRBC: 0 % (ref 0.0–0.2)

## 2020-05-09 LAB — COMPREHENSIVE METABOLIC PANEL
ALT: 36 U/L (ref 0–44)
AST: 33 U/L (ref 15–41)
Albumin: 2.7 g/dL — ABNORMAL LOW (ref 3.5–5.0)
Alkaline Phosphatase: 69 U/L (ref 38–126)
Anion gap: 7 (ref 5–15)
BUN: 20 mg/dL (ref 8–23)
CO2: 22 mmol/L (ref 22–32)
Calcium: 8.2 mg/dL — ABNORMAL LOW (ref 8.9–10.3)
Chloride: 111 mmol/L (ref 98–111)
Creatinine, Ser: 0.87 mg/dL (ref 0.44–1.00)
GFR, Estimated: >60 mL/min (ref >60)
Glucose, Bld: 102 mg/dL — ABNORMAL HIGH (ref 70–99)
Potassium: 3.7 mmol/L (ref 3.5–5.1)
Sodium: 140 mmol/L (ref 135–145)
Total Bilirubin: 1.1 mg/dL (ref 0.3–1.2)
Total Protein: 5.1 g/dL — ABNORMAL LOW (ref 6.5–8.1)

## 2020-05-09 MED ORDER — TAMSULOSIN HCL 0.4 MG PO CAPS
0.4000 mg | ORAL_CAPSULE | Freq: Every day | ORAL | 0 refills | Status: DC
Start: 2020-05-09 — End: 2020-05-29

## 2020-05-09 MED ORDER — OXYCODONE HCL 5 MG PO TABS: 5.0000 mg | ORAL_TABLET | ORAL | 0 refills | Status: DC | PRN

## 2020-05-09 MED ORDER — CEFUROXIME AXETIL 500 MG PO TABS
500.0000 mg | ORAL_TABLET | Freq: Two times a day (BID) | ORAL | 0 refills | Status: DC
Start: 2020-05-09 — End: 2020-05-12

## 2020-05-09 MED ORDER — SODIUM CHLORIDE 0.9 % IV SOLN: INTRAVENOUS | Status: DC

## 2020-05-09 MED ORDER — ONDANSETRON HCL 4 MG PO TABS: 4.0000 mg | ORAL_TABLET | Freq: Four times a day (QID) | ORAL | 0 refills | Status: DC | PRN

## 2020-05-09 MED ORDER — TAMSULOSIN HCL 0.4 MG PO CAPS
0.4000 mg | ORAL_CAPSULE | Freq: Every day | ORAL | Status: DC
  Administered 2020-05-09 – 2020-05-10 (×2): 0.4 mg via ORAL
  Filled 2020-05-09 (×2): qty 1

## 2020-05-09 NOTE — Progress Notes (Addendum)
Progress Note    AYODELE HARTSOCK  RCV:[id number] DOB: 1951-01-22  DOA: 05/07/2020 PCP: Rutherford Guys, MD (Inactive)    Brief Narrative:     Medical records reviewed and are as summarized below:  Cheyenne Sanders is an 69 y.o. 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 Patient reports a similar episode about 2 weeks ago that went away on its own but recurred over the last couple of days to the point that she needed to come to the emergency room.  Assessment/Plan:   Active Problems:   Diverticulitis   71mm stone in the right ureter  Causing Right lower quadrant pain - mild hydroureter and right hydronephrosis -urology: decided to try and manage her stone with medical explusion therapy and f/u as outpatient.  Since her symptoms have improved and she is not septic appearing, I think its reasonable to try and pass the stone.  I would suggest that she be discharged home with pain medication, tamsulosin and zofran.  She should also strain her urine if possible.   We can have her fu in our clinic in the next 7-10 days for re-eval. -Urinalysis does show evidence of blood -Cover with ceftriaxone, urine cultures with ecoli -IVF overnight -strain urine  leukocytosis -continue IV abx  pancreatic cysts: -seen on MRI: Multiple small scattered cysts within the pancreas. The largest is in the distal tail of pancreas measuring 1.7 cm. Interval surveillance imaging is recommended. Initial follow-up examination should be obtained in 6 months with repeat MRI without and with contrast material.  Essential hypertension -resume home meds  ?  Colitis on CT Scan -doubt as no symptoms -d/c flagyl   Family Communication/Anticipated D/C date and plan/Code Status   DVT prophylaxis: ambulate/scd Code Status: Full Code.  Family Communication: son at bedside Disposition Plan: Status is: Observation  The patient will require care spanning > 2 midnights and should be moved to inpatient because: IV treatments appropriate due to intensity of illness or inability to take PO  Dispo: The patient is from: Home              Anticipated d/c is to: Home              Anticipated d/c date is: 1 day              Patient currently is not medically stable to d/c.         Medical Consultants:    urology  Subjective:   C/o tenderness still, very dark urine  Objective:    Vitals:   05/08/20 2123 05/08/20 2150 05/09/20 0634 05/09/20 1155  BP: (!) 104/57 104/62 124/62 140/86  Pulse: 67 73 68 70  Resp: 18 20 15 17   Temp: 98.5 F (36.9 C)  98.2 F (36.8 C) 97.9 F (36.6 C)  TempSrc: Oral  Oral Oral  SpO2: 97% 94% 97% 98%  Weight:      Height:        Intake/Output Summary (Last 24 hours) at 05/09/2020 1654 Last data filed at 05/08/2020 2134 Gross per 24 hour  Intake 67.15 ml  Output 800 ml  Net -732.85 ml   Filed Weights   05/07/20 2105  Weight: 60 kg  Exam:  General: Appearance:    Well developed, well nourished female in no acute distress     Lungs:      respirations unlabored  Heart:    Normal heart rate.   MS:   All extremities are intact.   Neurologic:   Awake, alert, oriented x 3. No apparent focal neurological           defect.     Data Reviewed:   I have personally reviewed following labs and imaging studies:  Labs: Labs show the following:   Basic Metabolic Panel: Recent Labs  Lab 05/07/20 2111 05/09/20 0340  NA 132* 140  K 3.7 3.7  CL 100 111  CO2 20* 22  GLUCOSE 129* 102*  BUN 24* 20  CREATININE 0.83 0.87  CALCIUM 9.4 8.2*   GFR Estimated Creatinine Clearance: 54.2 mL/min (by C-G  formula based on SCr of 0.87 mg/dL). Liver Function Tests: Recent Labs  Lab 05/07/20 2111 05/09/20 0340  AST 38 33  ALT 41 36  ALKPHOS 67 69  BILITOT 1.0 1.1  PROT 6.6 5.1*  ALBUMIN 3.9 2.7*   Recent Labs  Lab 05/07/20 2111  LIPASE 26   No results for input(s): AMMONIA in the last 168 hours. Coagulation profile No results for input(s): INR, PROTIME in the last 168 hours.  CBC: Recent Labs  Lab 05/07/20 2111 05/09/20 0340 05/09/20 1539  WBC 6.6 17.7* 19.2*  HGB 13.1 10.7* 11.5*  HCT 40.8 32.8* 36.5  MCV 96.0 95.6 100.0  PLT 219 141* 142*   Cardiac Enzymes: No results for input(s): CKTOTAL, CKMB, CKMBINDEX, TROPONINI in the last 168 hours. BNP (last 3 results) No results for input(s): PROBNP in the last 8760 hours. CBG: No results for input(s): GLUCAP in the last 168 hours. D-Dimer: No results for input(s): DDIMER in the last 72 hours. Hgb A1c: No results for input(s): HGBA1C in the last 72 hours. Lipid Profile: No results for input(s): CHOL, HDL, LDLCALC, TRIG, CHOLHDL, LDLDIRECT in the last 72 hours. Thyroid function studies: No results for input(s): TSH, T4TOTAL, T3FREE, THYROIDAB in the last 72 hours.  Invalid input(s): FREET3 Anemia work up: No results for input(s): VITAMINB12, FOLATE, FERRITIN, TIBC, IRON, RETICCTPCT in the last 72 hours. Sepsis Labs: Recent Labs  Lab 05/07/20 2111 05/09/20 0340 05/09/20 1539  WBC 6.6 17.7* 19.2*    Microbiology Recent Results (from the past 240 hour(s))  Urine culture     Status: Abnormal (Preliminary result)   Collection Time: 05/08/20  1:00 AM   Specimen: Urine, Clean Catch  Result Value Ref Range Status   Specimen Description URINE, CLEAN CATCH  Final   Special Requests NONE  Final   Culture (A)  Final    >=100,000 COLONIES/mL ESCHERICHIA COLI SUSCEPTIBILITIES TO FOLLOW Performed at [facility], [address] [place], [place] [address]    Report Status PENDING  Incomplete  Respiratory Panel  by RT PCR (Flu A&B, Covid) - Nasopharyngeal Swab     Status: None   Collection Time: 05/08/20  6:16 AM   Specimen: Nasopharyngeal Swab  Result Value Ref Range Status   SARS Coronavirus 2 by RT PCR NEGATIVE NEGATIVE Final    Comment: (NOTE) SARS-CoV-2 target nucleic acids are NOT DETECTED.  The SARS-CoV-2 RNA is generally detectable in upper respiratoy specimens during the acute phase of infection. The lowest concentration of SARS-CoV-2 viral copies this assay can detect is 131 copies/mL. A negative result does not preclude SARS-Cov-2 infection and should not be  used as the sole basis for treatment or other patient management decisions. A negative result may occur with  improper specimen collection/handling, submission of specimen other than nasopharyngeal swab, presence of viral mutation(s) within the areas targeted by this assay, and inadequate number of viral copies (<131 copies/mL). A negative result must be combined with clinical observations, patient history, and epidemiological information. The expected result is Negative.  Fact Sheet for Patients:  PinkCheek.be  Fact Sheet for Healthcare Providers:  GravelBags.it  This test is no t yet approved or cleared by the [place] FDA and  has been authorized for detection and/or diagnosis of SARS-CoV-2 by FDA under an Emergency Use Authorization (EUA). This EUA will remain  in effect (meaning this test can be used) for the duration of the COVID-19 declaration under Section 564(b)(1) of the Act, 21 U.S.C. section 360bbb-3(b)(1), unless the authorization is terminated or revoked sooner.     Influenza A by PCR NEGATIVE NEGATIVE Final   Influenza B by PCR NEGATIVE NEGATIVE Final    Comment: (NOTE) The Xpert Xpress SARS-CoV-2/FLU/RSV assay is intended as an aid in  the diagnosis of influenza from Nasopharyngeal swab specimens and  should not be used as a sole basis for  treatment. Nasal washings and  aspirates are unacceptable for Xpert Xpress SARS-CoV-2/FLU/RSV  testing.  Fact Sheet for Patients: PinkCheek.be  Fact Sheet for Healthcare Providers: GravelBags.it  This test is not yet approved or cleared by the [place] FDA and  has been authorized for detection and/or diagnosis of SARS-CoV-2 by  FDA under an Emergency Use Authorization (EUA). This EUA will remain  in effect (meaning this test can be used) for the duration of the  Covid-19 declaration under Section 564(b)(1) of the Act, 21  U.S.C. section 360bbb-3(b)(1), unless the authorization is  terminated or revoked. Performed at [facility], [address], [place], [place] [address]     Procedures and diagnostic studies:  MR ABDOMEN W WO CONTRAST  Result Date: 05/08/2020 CLINICAL DATA:  Evaluate liver lesions.  History of breast cancer. EXAM: MRI ABDOMEN WITHOUT AND WITH CONTRAST TECHNIQUE: Multiplanar multisequence MR imaging of the abdomen was performed both before and after the administration of intravenous contrast. CONTRAST:  38mL GADAVIST GADOBUTROL 1 MMOL/ML IV SOLN COMPARISON:  CT AP 05/08/2020. FINDINGS: Lower chest: New small right pleural effusion. Bilateral lower lobe atelectasis or airspace disease is new from earlier today. Hepatobiliary: Respiratory motion artifact diminishes exam detail on multiple sequences. Hepatic steatosis noted. Mild diffuse increased periportal T2 signal suggestive of edema. Within the lateral segment of left hepatic lobe there is a 1.1 cm lesion which is mildly T2 hyperintense and T1 hypointense measuring 1.1 cm, image 41/902. On the postcontrast images there is no internal enhancement associated with this lesion. Inferior right hepatic lobe lesion measures 5 mm, image 68/904. There is no enhancement associated with this lesion. This is not confidently identified on the precontrast T1 weighted  images or the precontrast T2 weighted sequences. Within the anterior aspect of the left hepatic lobe there is a 4 mm T1 hypointense and T2 hyperintense structure, image 43/902. No focal abnormal enhancement is associated with this lesion. Small stones noted within the dependent portion of the gallbladder. No significant bile duct dilatation. Pancreas: No mass, inflammatory changes, or other parenchymal abnormality identified. Multiple small scattered cysts are identified within the pancreas. The largest is in the distal tail of pancreas with a maximum diameter 1.7 cm, image 80/7. No ductal dilatation associated with these  lesions. Spleen:  Within normal limits in size and appearance. Adrenals/Urinary Tract: Normal appearance of the adrenal glands. Right-sided hydronephrosis is again identified secondary to previously noted distal right ureteral calculus. Small cyst is noted within the upper pole of right kidney measuring 6 mm, image 30/10. There is diffuse perinephric edema and fluid. Left kidney inferior pole parapelvic cysts noted. Stomach/Bowel: Stomach is normal. There is no dilated loops of small or large bowel. Vascular/Lymphatic: No pathologically enlarged lymph nodes identified. No abdominal aortic aneurysm demonstrated. Other:  None. Musculoskeletal: No suspicious bone lesions identified. IMPRESSION: 1. There are at least 3 nonenhancing liver lesions corresponding to the CT abnormality. No abnormal enhancement associated with these lesions which are favored to represent benign cysts. 2. Hepatic steatosis. 3. Multiple small scattered cysts within the pancreas. The largest is in the distal tail of pancreas measuring 1.7 cm. Interval surveillance imaging is recommended. Initial follow-up examination should be obtained in 6 months with repeat MRI without and with contrast material. This recommendation follows ACR consensus guidelines: Management of Incidental Pancreatic Cysts: A White Paper of the ACR  Incidental Findings Committee. [place] 8119;[id number]. 4. Persistent right-sided hydronephrosis, hydroureter and perinephric fat stranding secondary to distal right ureteral calculus. Electronically Signed   By: Kerby Moors M.D.   On: 05/08/2020 12:56   CT ABDOMEN PELVIS W CONTRAST  Result Date: 05/08/2020 CLINICAL DATA:  Right lower quadrant abdominal pain. Low back pain. Vomiting. History of kidney stones and breast cancer. EXAM: CT ABDOMEN AND PELVIS WITH CONTRAST TECHNIQUE: Multidetector CT imaging of the abdomen and pelvis was performed using the standard protocol following bolus administration of intravenous contrast. CONTRAST:  11mL OMNIPAQUE IOHEXOL 300 MG/ML  SOLN COMPARISON:  None. FINDINGS: Lower chest: Minor linear atelectasis in both lower lobes. No pleural fluid. Hepatobiliary: There is an 11 x 11 mm low-density involving the far left lobe of the liver, series 3, image 15. Question of additional vague hypodensity in the inferior right lobe, series 3, image 25. Subcapsular lesion in the hepatic dome series 3, image 6, subcentimeter. Mild gallbladder distension but no evidence of calcified gallstone or pericholecystic inflammation. No significant biliary dilatation. Pancreas: 12 mm low-density lesion in the pancreatic tail, series 3, image 17. No ductal dilatation or inflammation. Spleen: Normal in size without focal abnormality. Adrenals/Urinary Tract: Normal adrenal glands. 6 mm calcification in the right pelvis, difficult to differentiate within or adjacent to the right ureter. There is dilatation of both renal pelvises, more prominent on the right as well as mild right ureteral dilatation. Punctate nonobstructing stone in the upper right kidney. There is no significant perinephric edema. Slightly diminished excretion from the right renal collecting system on delayed phase imaging. Tiny cortical hypodensities in the left kidney are too small to characterize but likely small cysts.  Urinary bladder is unremarkable. Stomach/Bowel: Small hiatal hernia. The stomach is decompressed. No small bowel dilatation, obstruction or inflammation. The appendix is tentatively but not definitively identified, there is no pericecal or right lower quadrant inflammation. Colonic diverticulosis involving the descending and sigmoid colon. The sigmoid colon is redundant. Questionable area of colonic wall thickening and pericolonic edema involving the redundant sigmoid colon, series 3, image 55. Vascular/Lymphatic: Aortic atherosclerosis. No aortic aneurysm. Patent portal vein. No enlarged lymph nodes in the abdomen or pelvis. Reproductive: Status post hysterectomy. No adnexal masses. Other: No free air, free fluid, or intra-abdominal fluid collection. 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 There are no acute or suspicious osseous abnormalities. IMPRESSION: 1.  Questionable area of colonic wall thickening and pericolonic edema involving the redundant sigmoid colon. This may represent short segment colitis or diverticulitis. No perforation. 2. Right pelvic 6 mm calcification, difficult to differentiate if this is within or adjacent to the right ureter or an adjacent phlebolith. There is mild right hydroureter and dilatation of the right renal collecting system without significant perinephric edema. There is also dilatation of the left renal pelvis suggesting underlying extrarenal pelvis configuration. 3. Additional nonobstructing right renal stone. 4. Low-density lesions in the liver, largest measuring 11 x 11 mm in the far left lobe of the liver, nonspecific. Given history of breast cancer, recommend further evaluation with hepatic protocol MRI. There is also a 12 mm low-density lesion in the pancreatic tail. Recommend further characterization with pancreatic protocol MRI on an elective basis. 5. Small hiatal hernia. Aortic Atherosclerosis (ICD10-I70.0). Electronically Signed   By: Keith Rake M.D.   On: 05/08/2020 00:45   DG HIP UNILAT WITH PELVIS 1V LEFT  Result Date: 05/08/2020 CLINICAL DATA:  Bilateral hip pain with no known injury EXAM: DG HIP (WITH OR WITHOUT PELVIS) 1V*L*; DG HIP (WITH OR WITHOUT PELVIS) 1V RIGHT COMPARISON:  Abdomen and pelvis CT from earlier the same day FINDINGS: There is no evidence of hip fracture or dislocation. There is no evidence of arthropathy or other focal bone abnormality. Enteric contrast in the bladder and dilated right ureter, see preceding CT. IMPRESSION: 1. Negative pelvis and hips. 2. Right hydroureter correlating with right ureteral stone on preceding CT. Electronically Signed   By: Monte Fantasia M.D.   On: 05/08/2020 11:23   DG HIP UNILAT WITH PELVIS 1V RIGHT  Result Date: 05/08/2020 CLINICAL DATA:  Bilateral hip pain with no known injury EXAM: DG HIP (WITH OR WITHOUT PELVIS) 1V*L*; DG HIP (WITH OR WITHOUT PELVIS) 1V RIGHT COMPARISON:  Abdomen and pelvis CT from earlier the same day FINDINGS: There is no evidence of hip fracture or dislocation. There is no evidence of arthropathy or other focal bone abnormality. Enteric contrast in the bladder and dilated right ureter, see preceding CT. IMPRESSION: 1. Negative pelvis and hips. 2. Right hydroureter correlating with right ureteral stone on preceding CT. Electronically Signed   By: Monte Fantasia M.D.   On: 05/08/2020 11:23    Medications:   . metoprolol succinate  25 mg Oral Daily  . tamsulosin  0.4 mg Oral Daily   Continuous Infusions: . sodium chloride    . cefTRIAXone (ROCEPHIN)  IV 1 g (05/09/20 0842)  . metronidazole 500 mg (05/09/20 1331)     LOS: 0 days   Geradine Girt  [facility]   How to contact the [facility] Attending or Consulting provider [place] or covering provider during after hours [place], for this patient?  1. Check the care team in [facility] and look for a) attending/consulting [facility] provider listed and b) the [facility] team listed 2. Log into [email or web address] and  use [place]'s universal password to access. If you do not have the password, please contact the hospital operator. 3. Locate the [facility] provider you are looking for under [facility] and page to a number that you can be directly reached. 4. If you still have difficulty reaching the provider, please page the [facility] (Director on Call) for the Hospitalists listed on amion for assistance.  05/09/2020, 4:54 PM

## 2020-05-10 DIAGNOSIS — K5792 Diverticulitis of intestine, part unspecified, without perforation or abscess without bleeding: Secondary | ICD-10-CM

## 2020-05-10 DIAGNOSIS — N134 Hydroureter: Secondary | ICD-10-CM

## 2020-05-10 DIAGNOSIS — N390 Urinary tract infection, site not specified: Secondary | ICD-10-CM

## 2020-05-10 DIAGNOSIS — Z853 Personal history of malignant neoplasm of breast: Secondary | ICD-10-CM

## 2020-05-10 DIAGNOSIS — N132 Hydronephrosis with renal and ureteral calculous obstruction: Secondary | ICD-10-CM

## 2020-05-10 DIAGNOSIS — I10 Essential (primary) hypertension: Secondary | ICD-10-CM

## 2020-05-10 DIAGNOSIS — K869 Disease of pancreas, unspecified: Secondary | ICD-10-CM

## 2020-05-10 LAB — URINE CULTURE: Culture: >=100000 — AB

## 2020-05-10 LAB — CBC
HCT: 33.4 % — ABNORMAL LOW (ref 36.0–46.0)
Hemoglobin: 11 g/dL — ABNORMAL LOW (ref 12.0–15.0)
MCH: 31.3 pg (ref 26.0–34.0)
MCHC: 32.9 g/dL (ref 30.0–36.0)
MCV: 95.2 fL (ref 80.0–100.0)
Platelets: 144 10*3/uL — ABNORMAL LOW (ref 150–400)
RBC: 3.51 MIL/uL — ABNORMAL LOW (ref 3.87–5.11)
RDW: 14 % (ref 11.5–15.5)
WBC: 15.6 10*3/uL — ABNORMAL HIGH (ref 4.0–10.5)
nRBC: 0 % (ref 0.0–0.2)

## 2020-05-10 LAB — BASIC METABOLIC PANEL
Anion gap: 9 (ref 5–15)
BUN: 19 mg/dL (ref 8–23)
CO2: 18 mmol/L — ABNORMAL LOW (ref 22–32)
Calcium: 8.1 mg/dL — ABNORMAL LOW (ref 8.9–10.3)
Chloride: 113 mmol/L — ABNORMAL HIGH (ref 98–111)
Creatinine, Ser: 0.67 mg/dL (ref 0.44–1.00)
GFR, Estimated: >60 mL/min (ref >60)
Glucose, Bld: 105 mg/dL — ABNORMAL HIGH (ref 70–99)
Potassium: 4.1 mmol/L (ref 3.5–5.1)
Sodium: 140 mmol/L (ref 135–145)

## 2020-05-10 MED ORDER — OXYCODONE HCL 5 MG PO TABS
5.0000 mg | ORAL_TABLET | Freq: Four times a day (QID) | ORAL | 0 refills | Status: AC | PRN
Start: 2020-05-10 — End: 2020-05-15

## 2020-05-10 NOTE — Discharge Summary (Signed)
Physician Discharge Summary  Cheyenne Sanders TDV:[id number] DOB: 07/02/1951 DOA: 05/07/2020  PCP: Rutherford Guys, MD (Inactive)  Admit date: 05/07/2020 Discharge date: 05/10/2020  Admitted From: Home Disposition: Home  Recommendations for Outpatient Follow-up:  1. Follow ups as below. 2. Please obtain CBC/BMP/Mag at follow up 3. Please follow up on the following pending results: None  Home Health: None required Equipment/Devices: None required  Discharge Condition: Stable CODE STATUS: Full code   Follow-up Information    [facility] In 10 days.   Why: further eval for kidney stone Contact information: [place]       Rutherford Guys, MD. Schedule an appointment as soon as possible for a visit in 1 week(s).   Specialty: Family Medicine Contact information: [address] [place] [id number] [phone]                Hospital Course: 69 year old female with history of recurrent breast cancer s/p right partial mastectomy with right axillary dissection, whole breast radiation and adjuvant chemotherapy not on active treatment currently, HTN, HLD and nephrolithiasis presenting with right-sided abdominal pain and admitted for obstructive nephrolithiasis.  CT revealed a 6 mm right ureteral stone with mild hydroureter and right hydronephrosis and possible UTI.  Urology consulted and discussed treatment options including ureteral stent and conservative measures.  Patient opted for conservative measures with p.o. Flomax, analgesics and antiemetics.  Urine culture grew E. coli sensitive to ceftriaxone.  She received IV ceftriaxone and transitioned to p.o. cefdinir on discharge.  Patient to continue straining her urine.  She will follow-up with urology and PCP in 1 week.  See individual problem list below for more hospital course. Discharge Diagnoses:  Obstructive nephrolithiasis: Patient opted  for conservative measures after meeting with urology. -Continue Flomax, as needed Percocet and Zofran -Antibiotics as below. -Patient to continue straining urine  Complicated E. coli UTI with obstructive nephrolithiasis.  E. coli sensitive to ceftriaxone. -Received ceftriaxone in-house -Discharged on cefdinir for 5 more days.  Leukocytosis/bandemia: Likely due to UTI. -Recheck CBC in 1 week  Pancreatic cysts: Incidental finding.  Noted on MRI that showed multiple small scattered cysts within the pancreas. The largest is in the distal tail of pancreas measuring 1.7 cm. Interval surveillance imaging is recommended. Initial follow-up examination should be obtained in 6 months with repeat MRI without and with contrast material.  Essential hypertension: Normotensive. -Continue home medications.  Non-anion gap metabolic acidosis: Likely due to IV fluid. -recheck BMP in 1 week  Body mass index is 22.71 kg/m.            Discharge Exam: Vitals:   05/09/20 2345 05/10/20 0607  BP: (!) 141/71 (!) 144/72  Pulse: 64 61  Resp: 18 18  Temp: 98.3 F (36.8 C) 98.2 F (36.8 C)  SpO2: 98% 92%    GENERAL: No apparent distress.  Nontoxic. HEENT: MMM.  Vision and hearing grossly intact.  NECK: Supple.  No apparent JVD.  RESP:  No IWOB.  Fair aeration bilaterally. CVS:  RRR. Heart sounds normal.  ABD/GI/GU: Bowel sounds present. Soft. Non tender but discomfort MSK/EXT:  Moves extremities. No apparent deformity. No edema.  SKIN: no apparent skin lesion or wound NEURO: Awake, alert and oriented appropriately.  No apparent focal neuro deficit. PSYCH: Calm. Normal affect.  Discharge Instructions  Discharge Instructions    Diet general   Complete by: As directed    Discharge instructions   Complete by:  As directed    Strain all urine Drink lots of liquids Needs urology follow up in 5-7 days   Increase activity slowly   Complete by: As directed      Allergies as of 05/10/2020       Reactions   Sublimaze [fentanyl] Shortness Of Breath      Medication List    TAKE these medications   cefUROXime 500 MG tablet Commonly known as: CEFTIN Take 1 tablet (500 mg total) by mouth 2 (two) times daily for 5 days.   meloxicam 15 MG tablet Commonly known as: MOBIC Take 1 tablet (15 mg total) by mouth daily.   Metoprolol Succinate 25 MG Cs24 Take 25 mg by mouth daily.   ondansetron 4 MG tablet Commonly known as: ZOFRAN Take 1 tablet (4 mg total) by mouth every 6 (six) hours as needed for nausea.   oxyCODONE 5 MG immediate release tablet Commonly known as: Oxy IR/ROXICODONE Take 1 tablet (5 mg total) by mouth every 4 (four) hours as needed for up to 5 days for severe pain.   tamsulosin 0.4 MG Caps capsule Commonly known as: FLOMAX Take 1 capsule (0.4 mg total) by mouth daily.       Consultations:  Urology  Procedures/Studies:   MR ABDOMEN W WO CONTRAST  Result Date: 05/08/2020 CLINICAL DATA:  Evaluate liver lesions.  History of breast cancer. EXAM: MRI ABDOMEN WITHOUT AND WITH CONTRAST TECHNIQUE: Multiplanar multisequence MR imaging of the abdomen was performed both before and after the administration of intravenous contrast. CONTRAST:  80mL GADAVIST GADOBUTROL 1 MMOL/ML IV SOLN COMPARISON:  CT AP 05/08/2020. FINDINGS: Lower chest: New small right pleural effusion. Bilateral lower lobe atelectasis or airspace disease is new from earlier today. Hepatobiliary: Respiratory motion artifact diminishes exam detail on multiple sequences. Hepatic steatosis noted. Mild diffuse increased periportal T2 signal suggestive of edema. Within the lateral segment of left hepatic lobe there is a 1.1 cm lesion which is mildly T2 hyperintense and T1 hypointense measuring 1.1 cm, image 41/902. On the postcontrast images there is no internal enhancement associated with this lesion. Inferior right hepatic lobe lesion measures 5 mm, image 68/904. There is no enhancement associated with this  lesion. This is not confidently identified on the precontrast T1 weighted images or the precontrast T2 weighted sequences. Within the anterior aspect of the left hepatic lobe there is a 4 mm T1 hypointense and T2 hyperintense structure, image 43/902. No focal abnormal enhancement is associated with this lesion. Small stones noted within the dependent portion of the gallbladder. No significant bile duct dilatation. Pancreas: No mass, inflammatory changes, or other parenchymal abnormality identified. Multiple small scattered cysts are identified within the pancreas. The largest is in the distal tail of pancreas with a maximum diameter 1.7 cm, image 80/7. No ductal dilatation associated with these lesions. Spleen:  Within normal limits in size and appearance. Adrenals/Urinary Tract: Normal appearance of the adrenal glands. Right-sided hydronephrosis is again identified secondary to previously noted distal right ureteral calculus. Small cyst is noted within the upper pole of right kidney measuring 6 mm, image 30/10. There is diffuse perinephric edema and fluid. Left kidney inferior pole parapelvic cysts noted. Stomach/Bowel: Stomach is normal. There is no dilated loops of small or large bowel. Vascular/Lymphatic: No pathologically enlarged lymph nodes identified. No abdominal aortic aneurysm demonstrated. Other:  None. Musculoskeletal: No suspicious bone lesions identified. IMPRESSION: 1. There are at least 3 nonenhancing liver lesions corresponding to the CT abnormality. No abnormal enhancement associated with these  lesions which are favored to represent benign cysts. 2. Hepatic steatosis. 3. Multiple small scattered cysts within the pancreas. The largest is in the distal tail of pancreas measuring 1.7 cm. Interval surveillance imaging is recommended. Initial follow-up examination should be obtained in 6 months with repeat MRI without and with contrast material. This recommendation follows ACR consensus guidelines:  Management of Incidental Pancreatic Cysts: A White Paper of the ACR Incidental Findings Committee. [place] 1610;96:[id number]. 4. Persistent right-sided hydronephrosis, hydroureter and perinephric fat stranding secondary to distal right ureteral calculus. Electronically Signed   By: Kerby Moors M.D.   On: 05/08/2020 12:56   CT ABDOMEN PELVIS W CONTRAST  Result Date: 05/08/2020 CLINICAL DATA:  Right lower quadrant abdominal pain. Low back pain. Vomiting. History of kidney stones and breast cancer. EXAM: CT ABDOMEN AND PELVIS WITH CONTRAST TECHNIQUE: Multidetector CT imaging of the abdomen and pelvis was performed using the standard protocol following bolus administration of intravenous contrast. CONTRAST:  178mL OMNIPAQUE IOHEXOL 300 MG/ML  SOLN COMPARISON:  None. FINDINGS: Lower chest: Minor linear atelectasis in both lower lobes. No pleural fluid. Hepatobiliary: There is an 11 x 11 mm low-density involving the far left lobe of the liver, series 3, image 15. Question of additional vague hypodensity in the inferior right lobe, series 3, image 25. Subcapsular lesion in the hepatic dome series 3, image 6, subcentimeter. Mild gallbladder distension but no evidence of calcified gallstone or pericholecystic inflammation. No significant biliary dilatation. Pancreas: 12 mm low-density lesion in the pancreatic tail, series 3, image 17. No ductal dilatation or inflammation. Spleen: Normal in size without focal abnormality. Adrenals/Urinary Tract: Normal adrenal glands. 6 mm calcification in the right pelvis, difficult to differentiate within or adjacent to the right ureter. There is dilatation of both renal pelvises, more prominent on the right as well as mild right ureteral dilatation. Punctate nonobstructing stone in the upper right kidney. There is no significant perinephric edema. Slightly diminished excretion from the right renal collecting system on delayed phase imaging. Tiny cortical hypodensities in the  left kidney are too small to characterize but likely small cysts. Urinary bladder is unremarkable. Stomach/Bowel: Small hiatal hernia. The stomach is decompressed. No small bowel dilatation, obstruction or inflammation. The appendix is tentatively but not definitively identified, there is no pericecal or right lower quadrant inflammation. Colonic diverticulosis involving the descending and sigmoid colon. The sigmoid colon is redundant. Questionable area of colonic wall thickening and pericolonic edema involving the redundant sigmoid colon, series 3, image 55. Vascular/Lymphatic: Aortic atherosclerosis. No aortic aneurysm. Patent portal vein. No enlarged lymph nodes in the abdomen or pelvis. Reproductive: Status post hysterectomy. No adnexal masses. Other: No free air, free fluid, or intra-abdominal fluid collection. 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 There are no acute or suspicious osseous abnormalities. IMPRESSION: 1. Questionable area of colonic wall thickening and pericolonic edema involving the redundant sigmoid colon. This may represent short segment colitis or diverticulitis. No perforation. 2. Right pelvic 6 mm calcification, difficult to differentiate if this is within or adjacent to the right ureter or an adjacent phlebolith. There is mild right hydroureter and dilatation of the right renal collecting system without significant perinephric edema. There is also dilatation of the left renal pelvis suggesting underlying extrarenal pelvis configuration. 3. Additional nonobstructing right renal stone. 4. Low-density lesions in the liver, largest measuring 11 x 11 mm in the far left lobe of the liver, nonspecific. Given history of breast cancer, recommend further evaluation with hepatic protocol MRI.  There is also a 12 mm low-density lesion in the pancreatic tail. Recommend further characterization with pancreatic protocol MRI on an elective basis. 5. Small hiatal hernia. Aortic  Atherosclerosis (ICD10-I70.0). Electronically Signed   By: Keith Rake M.D.   On: 05/08/2020 00:45   DG HIP UNILAT WITH PELVIS 1V LEFT  Result Date: 05/08/2020 CLINICAL DATA:  Bilateral hip pain with no known injury EXAM: DG HIP (WITH OR WITHOUT PELVIS) 1V*L*; DG HIP (WITH OR WITHOUT PELVIS) 1V RIGHT COMPARISON:  Abdomen and pelvis CT from earlier the same day FINDINGS: There is no evidence of hip fracture or dislocation. There is no evidence of arthropathy or other focal bone abnormality. Enteric contrast in the bladder and dilated right ureter, see preceding CT. IMPRESSION: 1. Negative pelvis and hips. 2. Right hydroureter correlating with right ureteral stone on preceding CT. Electronically Signed   By: Monte Fantasia M.D.   On: 05/08/2020 11:23   DG HIP UNILAT WITH PELVIS 1V RIGHT  Result Date: 05/08/2020 CLINICAL DATA:  Bilateral hip pain with no known injury EXAM: DG HIP (WITH OR WITHOUT PELVIS) 1V*L*; DG HIP (WITH OR WITHOUT PELVIS) 1V RIGHT COMPARISON:  Abdomen and pelvis CT from earlier the same day FINDINGS: There is no evidence of hip fracture or dislocation. There is no evidence of arthropathy or other focal bone abnormality. Enteric contrast in the bladder and dilated right ureter, see preceding CT. IMPRESSION: 1. Negative pelvis and hips. 2. Right hydroureter correlating with right ureteral stone on preceding CT. Electronically Signed   By: Monte Fantasia M.D.   On: 05/08/2020 11:23        The results of significant diagnostics from this hospitalization (including imaging, microbiology, ancillary and laboratory) are listed below for reference.     Microbiology: Recent Results (from the past 240 hour(s))  Urine culture     Status: Abnormal (Preliminary result)   Collection Time: 05/08/20  1:00 AM   Specimen: Urine, Clean Catch  Result Value Ref Range Status   Specimen Description URINE, CLEAN CATCH  Final   Special Requests NONE  Final   Culture (A)  Final     >=100,000 COLONIES/mL ESCHERICHIA COLI SUSCEPTIBILITIES TO FOLLOW Performed at [facility], [address] [place], [place] [address]    Report Status PENDING  Incomplete  Respiratory Panel by RT PCR (Flu A&B, Covid) - Nasopharyngeal Swab     Status: None   Collection Time: 05/08/20  6:16 AM   Specimen: Nasopharyngeal Swab  Result Value Ref Range Status   SARS Coronavirus 2 by RT PCR NEGATIVE NEGATIVE Final    Comment: (NOTE) SARS-CoV-2 target nucleic acids are NOT DETECTED.  The SARS-CoV-2 RNA is generally detectable in upper respiratoy specimens during the acute phase of infection. The lowest concentration of SARS-CoV-2 viral copies this assay can detect is 131 copies/mL. A negative result does not preclude SARS-Cov-2 infection and should not be used as the sole basis for treatment or other patient management decisions. A negative result may occur with  improper specimen collection/handling, submission of specimen other than nasopharyngeal swab, presence of viral mutation(s) within the areas targeted by this assay, and inadequate number of viral copies (<131 copies/mL). A negative result must be combined with clinical observations, patient history, and epidemiological information. The expected result is Negative.  Fact Sheet for Patients:  PinkCheek.be  Fact Sheet for Healthcare Providers:  GravelBags.it  This test is no t yet approved or cleared by the [place] FDA and  has been  authorized for detection and/or diagnosis of SARS-CoV-2 by FDA under an Emergency Use Authorization (EUA). This EUA will remain  in effect (meaning this test can be used) for the duration of the COVID-19 declaration under Section 564(b)(1) of the Act, 21 U.S.C. section 360bbb-3(b)(1), unless the authorization is terminated or revoked sooner.     Influenza A by PCR NEGATIVE NEGATIVE Final   Influenza B by PCR NEGATIVE NEGATIVE  Final    Comment: (NOTE) The Xpert Xpress SARS-CoV-2/FLU/RSV assay is intended as an aid in  the diagnosis of influenza from Nasopharyngeal swab specimens and  should not be used as a sole basis for treatment. Nasal washings and  aspirates are unacceptable for Xpert Xpress SARS-CoV-2/FLU/RSV  testing.  Fact Sheet for Patients: PinkCheek.be  Fact Sheet for Healthcare Providers: GravelBags.it  This test is not yet approved or cleared by the [place] FDA and  has been authorized for detection and/or diagnosis of SARS-CoV-2 by  FDA under an Emergency Use Authorization (EUA). This EUA will remain  in effect (meaning this test can be used) for the duration of the  Covid-19 declaration under Section 564(b)(1) of the Act, 21  U.S.C. section 360bbb-3(b)(1), unless the authorization is  terminated or revoked. Performed at [facility], [place] [address], [place], [place] [address]      Labs: BNP (last 3 results) No results for input(s): BNP in the last 8760 hours. Basic Metabolic Panel: Recent Labs  Lab 05/07/20 2111 05/09/20 0340 05/10/20 0052  NA 132* 140 140  K 3.7 3.7 4.1  CL 100 111 113*  CO2 20* 22 18*  GLUCOSE 129* 102* 105*  BUN 24* 20 19  CREATININE 0.83 0.87 0.67  CALCIUM 9.4 8.2* 8.1*   Liver Function Tests: Recent Labs  Lab 05/07/20 2111 05/09/20 0340  AST 38 33  ALT 41 36  ALKPHOS 67 69  BILITOT 1.0 1.1  PROT 6.6 5.1*  ALBUMIN 3.9 2.7*   Recent Labs  Lab 05/07/20 2111  LIPASE 26   No results for input(s): AMMONIA in the last 168 hours. CBC: Recent Labs  Lab 05/07/20 2111 05/09/20 0340 05/09/20 1539 05/10/20 0052  WBC 6.6 17.7* 19.2* 15.6*  HGB 13.1 10.7* 11.5* 11.0*  HCT 40.8 32.8* 36.5 33.4*  MCV 96.0 95.6 100.0 95.2  PLT 219 141* 142* 144*   Cardiac Enzymes: No results for input(s): CKTOTAL, CKMB, CKMBINDEX, TROPONINI in the last 168 hours. BNP: Invalid input(s):  POCBNP CBG: No results for input(s): GLUCAP in the last 168 hours. D-Dimer No results for input(s): DDIMER in the last 72 hours. Hgb A1c No results for input(s): HGBA1C in the last 72 hours. Lipid Profile No results for input(s): CHOL, HDL, LDLCALC, TRIG, CHOLHDL, LDLDIRECT in the last 72 hours. Thyroid function studies No results for input(s): TSH, T4TOTAL, T3FREE, THYROIDAB in the last 72 hours.  Invalid input(s): FREET3 Anemia work up No results for input(s): VITAMINB12, FOLATE, FERRITIN, TIBC, IRON, RETICCTPCT in the last 72 hours. Urinalysis    Component Value Date/Time   COLORURINE YELLOW 05/08/2020 0100   APPEARANCEUR CLEAR 05/08/2020 0100   APPEARANCEUR Clear 04/21/2020 1557   LABSPEC 1.028 05/08/2020 0100   PHURINE 5.0 05/08/2020 0100   GLUCOSEU NEGATIVE 05/08/2020 0100   HGBUR LARGE (A) 05/08/2020 0100   BILIRUBINUR NEGATIVE 05/08/2020 0100   BILIRUBINUR Negative 04/21/2020 1557   KETONESUR 5 (A) 05/08/2020 0100   PROTEINUR NEGATIVE 05/08/2020 0100   UROBILINOGEN 0.2 02/25/2019 1622   NITRITE POSITIVE (A) 05/08/2020 0100  LEUKOCYTESUR TRACE (A) 05/08/2020 0100   Sepsis Labs Invalid input(s): PROCALCITONIN,  WBC,  LACTICIDVEN   Time coordinating discharge: 35 minutes  SIGNED:  Mercy Riding, MD  [facility] 05/10/2020, 11:38 AM  If 7PM-7AM, please contact night-coverage [email or web address]

## 2020-05-12 ENCOUNTER — Encounter: Payer: Self-pay | Admitting: Family Medicine

## 2020-05-12 ENCOUNTER — Ambulatory Visit (INDEPENDENT_AMBULATORY_CARE_PROVIDER_SITE_OTHER): Payer: Medicare Other | Admitting: Family Medicine

## 2020-05-12 ENCOUNTER — Other Ambulatory Visit: Payer: Self-pay

## 2020-05-12 VITALS — BP 144/82 | HR 70 | Temp 98.0°F | Ht 64.0 in | Wt 132.0 lb

## 2020-05-12 DIAGNOSIS — R7989 Other specified abnormal findings of blood chemistry: Secondary | ICD-10-CM

## 2020-05-12 DIAGNOSIS — R079 Chest pain, unspecified: Secondary | ICD-10-CM

## 2020-05-12 DIAGNOSIS — M7989 Other specified soft tissue disorders: Secondary | ICD-10-CM

## 2020-05-12 DIAGNOSIS — I10 Essential (primary) hypertension: Secondary | ICD-10-CM | POA: Diagnosis not present

## 2020-05-12 DIAGNOSIS — R0789 Other chest pain: Secondary | ICD-10-CM | POA: Diagnosis not present

## 2020-05-12 MED ORDER — CEFUROXIME AXETIL 500 MG PO TABS: 500.0000 mg | ORAL_TABLET | Freq: Two times a day (BID) | ORAL | 0 refills | Status: AC

## 2020-05-12 NOTE — Progress Notes (Signed)
Subjective:  Patient ID: Cheyenne Sanders, female    DOB: [birth year]  Age: 69 y.o. MRN: [id number]  CC:  Chief Complaint  Patient presents with  . Chest Pain    pt reports pain under her R brest. Pt states it feel like it did when she was told she had pnomoniea. pt states later they found that it was ploisy. pt reports some SOB. PT reports pain in her legs more so in her L leg. pt reports the pain started shortly after having a MRI or CT scan recently.   . Edema    In L and R foot. pt reports pain in her L foot. pt states she isn't sure if the swelling is from the recent Meloxicam. pt has stoped taking the MEloxicam due to the edema.    HPI Cheyenne Sanders presents for   Chest pain Current pain under left breast. "keen pain" slight sharp, no change with movement.  Past 3-4 days. Comes and goes. No change with breathing. Unable to palpate pain.  NKI.  Noticed at rest.  No left arm radiation.   Slight cough, but not now.  No fever.  Some associated shortness of breath - past few days with ambulation.  No hx of dvt/PE. Hx of breast cancer - R partial mastectomy, radiation, chemo.  Leg swelling - left greater than right - ankles, past few days as well. No calf pain.  Reports history of pleurisy, similar symptoms at that time in March.  Initially describes I chest, then breast.  Needs additional ceftin - trouble swallowing this morning - had to spit out.  Pain 1.5 hrs this morning - then went away.   Not at present.   Hospitalized for nephrolithiasis 10/24-10/27. R ureteral stone - conservative treatment, urine cx with E coli, tx with ceftriaxone, then transitioned to cefdinir. Still taking cefdinir. Pain in back of legs since had CT scan. Sore when lying on table, better back in room and about the same since. Right and left foot swelling as above.  Had been on meloxicam recently, prescribed by Dr. Milinda Pointer for plantar fasciitis past few weeks. Stopped meloxicam few days ago.  Echo in  January - EF 60-65% no wall motion abnormalities.  Refuses xray today. Understands risks. Denies heartburn. Has tried gaviscon.  Hx of HTN:  Home readings - 169/81, then 145/79.  Metoprolol once per day - was due for next dose.   BP Readings from Last 3 Encounters:  05/12/20 (!) 144/82  05/10/20 131/74  04/21/20 132/80   History Patient Active Problem List   Diagnosis Date Noted  . Diverticulitis 05/08/2020  . Pain in joint of right hip 03/02/2019  . Skin infection 11/25/2017  . Tick bite 11/25/2017  . Flank pain 09/06/2016  . Recurrent nephrolithiasis 08/27/2016  . Personal history of breast cancer 08/27/2016  . Coronary artery disease involving native coronary artery of native heart without angina pectoris 06/28/2015  . Essential hypertension 06/28/2015  . Mixed hyperlipidemia 06/28/2015  . Precordial chest pain 06/28/2015   Past Medical History:  Diagnosis Date  . Breast cancer ([place]) 7867,6720   R breast cancer x 2; lumpectomy with radiation, chemotherapy; mastectomy R.  . Colon polyps   . Hyperlipidemia   . Hypertension   . Nephrolithiasis   . Personal history of chemotherapy   . Personal history of radiation therapy   . Pneumonia    Past Surgical History:  Procedure Laterality Date  . BREAST BIOPSY Left 2016  . BREAST  LUMPECTOMY Right 1999  . MASTECTOMY Right    2014  . PARTIAL HYSTERECTOMY  1986   DUB; ovaries intact.   Allergies  Allergen Reactions  . Sublimaze [Fentanyl] Shortness Of Breath   Prior to Admission medications   Medication Sig Start Date End Date Taking? Authorizing Provider  cefUROXime (CEFTIN) 500 MG tablet Take 1 tablet (500 mg total) by mouth 2 (two) times daily for 5 days. 05/09/20 05/14/20 Yes Vann, Jessica U, DO  Metoprolol Succinate 25 MG CS24 Take 25 mg by mouth daily. 04/21/20  Yes Rutherford Guys, MD  ondansetron (ZOFRAN) 4 MG tablet Take 1 tablet (4 mg total) by mouth every 6 (six) hours as needed for nausea. 05/09/20  Yes  Vann, Jessica U, DO  oxyCODONE (ROXICODONE) 5 MG immediate release tablet Take 1 tablet (5 mg total) by mouth every 6 (six) hours as needed for up to 5 days for severe pain. 05/10/20 05/15/20 Yes Mercy Riding, MD  tamsulosin (FLOMAX) 0.4 MG CAPS capsule Take 1 capsule (0.4 mg total) by mouth daily. 05/09/20  Yes Geradine Girt, DO  meloxicam (MOBIC) 15 MG tablet Take 1 tablet (15 mg total) by mouth daily. Patient not taking: Reported on 05/12/2020 04/26/20   Garrel Ridgel, DPM   Social History   Socioeconomic History  . Marital status: Widowed    Spouse name: Not on file  . Number of children: 2  . Years of education: Not on file  . Highest education level: Not on file  Occupational History  . Occupation: retired  Tobacco Use  . Smoking status: Never Smoker  . Smokeless tobacco: Never Used  Vaping Use  . Vaping Use: Never used  Substance and Sexual Activity  . Alcohol use: Yes    Alcohol/week: 1.0 standard drink    Types: 1 Glasses of wine per week    Comment: 2-3 times a year  . Drug use: No  . Sexual activity: Not Currently  Other Topics Concern  . 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      Tobacco: none      Alcohol: rare; social      Exercise: no formal exercise.        ADLs: independent with ADLs   Social Determinants of Health   Financial Resource Strain:   . Difficulty of Paying Living Expenses: Not on file  Food Insecurity:   . Worried About Charity fundraiser in the Last Year: Not on file  . Ran Out of Food in the Last Year: Not on file  Transportation Needs:   . Lack of Transportation (Medical): Not on file  . Lack of Transportation (Non-Medical): Not on file  Physical Activity:   . Days of Exercise per Week: Not on  file  . Minutes of Exercise per Session: Not on file  Stress:   . Feeling of Stress : Not on file  Social Connections:   . Frequency of Communication with Friends and Family: Not on file  . Frequency of Social Gatherings with Friends and Family: Not on file  . Attends Religious Services: Not on file  . Active Member of Clubs or Organizations: Not on file  .  Attends Archivist Meetings: Not on file  . Marital Status: Not on file  Intimate Partner Violence:   . Fear of Current or Ex-Partner: Not on file  . Emotionally Abused: Not on file  . Physically Abused: Not on file  . Sexually Abused: Not on file    Review of Systems Per HPI.   Objective:   Vitals:   05/12/20 1405 05/12/20 1412  BP: (!) 160/84 (!) 144/82  Pulse: 70   Temp: 98 F (36.7 C)   TempSrc: Temporal   SpO2: 97%   Weight: 132 lb (59.9 kg)   Height: 5\' 4"  (1.626 m)      Physical Exam Vitals reviewed. Exam conducted with a chaperone present Lexine Baton present. ).  Constitutional:      Appearance: She is well-developed.  HENT:     Head: Normocephalic and atraumatic.  Eyes:     Conjunctiva/sclera: Conjunctivae normal.     Pupils: Pupils are equal, round, and reactive to light.  Neck:     Vascular: No carotid bruit.  Cardiovascular:     Rate and Rhythm: Normal rate and regular rhythm.     Heart sounds: Normal heart sounds.  Pulmonary:     Effort: Pulmonary effort is normal.     Breath sounds: Normal breath sounds.  Chest:     Chest wall: Tenderness (slight ttp in left lower chest wall - just below breast. no apparent breast nodule, erythema, ttp. ) present.  Abdominal:     Palpations: Abdomen is soft. There is no pulsatile mass.     Tenderness: There is no abdominal tenderness.  Musculoskeletal:     Comments: Pedal edema, noted at ankles bilaterally 1+, with some pitting edema to mid tibia bilaterally, however right calf 3 cm greater circumference measured at 15 cm below patella. Negative Homans,  calf nontender without cords.  Skin:    General: Skin is warm and dry.  Neurological:     Mental Status: She is alert and oriented to person, place, and time.  Psychiatric:        Behavior: Behavior normal.    EKG sinus rhythm, rate 65, low voltage precordial leads, no apparent acute findings, compared to EKG March 11.   Assessment & Plan:  NAHOMY LIMBURG is a 69 y.o. female . Chest pain, unspecified type - Plan: D-dimer, quantitative (not at [facility]), EKG 12-Lead  Chest wall pain  Leg swelling - Plan: D-dimer, quantitative (not at [facility])  Essential hypertension   Possible component of chest wall pain, but atypical in some ways as well. Did have some asymmetry on lower leg edema. Recent hospitalization, hx of breast cancer as dvt/pe risk factors.. DDimer obtained. Refused CXR. ER precautions reviewed.  BP borderline, may be in part due to pain. Continue to monitor with RTC precautions.  Handout on pedal edema. recheck HTN in 1 week.  1 additional antibiotic provided to fill in for pill she did not keep down. Could consider oterh abx if difficulty tolerating.    Update 05/15/20 8:32 AM Legs are not swollen, elevated in evening. Only occasional pain and does state at times this has been present before. Denies worsening of symptoms.  CT chest to r/o PE recommended. She was not wanting to have CT if not needed, but risk of PE explained. All questions answered. She would agree to [place] legs, but declining CT at this time - would like to think about and discuss with son. Will call back in next few hours for decision.  8 min call.   1:06 PM Now states shortness of breath for years. May have been a little more shortness of breath coming out of the hospital but hard to say.  Chest symptoms - twinge in bed at times.  Is being treated for inflammation for her heel. She refuses CT chest PE protocol or VQ scan  at this time.  Aware of potential risks of PE if undiagnosed or untreated including  possible death.  She does agree to ultrasound of lower extremities initially.  Advised her to let me know if she changes her mind on the CT. Ultrasound ordered. 76min call. All questions answered.     Meds ordered this encounter  Medications  . cefUROXime (CEFTIN) 500 MG tablet    Sig: Take 1 tablet (500 mg total) by mouth 2 (two) times daily for 1 dose. Additional pill for pill that was unable to take.    Dispense:  1 tablet    Refill:  0   Patient Instructions   Although I was able to reproduce some of your pain on exam, I would like to check a blood clot test as you have had some leg swelling and the right leg appears to be more swollen than the left.  Also possible risk factor of blood clot with recent hospital stay and previous breast cancer.  If the D-dimer is elevated, will likely need to have ultrasound and possible CT scan of the chest if you are still having symptoms.  This most likely will need to be done through the ER.  If you have any acute worsening of symptoms, proceed to the emergency room.  I prescribed 1 additional antibiotic, but if you have further difficulty taking that medication, let us know and we can look at other options.  Blood pressure was borderline elevated today, but previously looked okay and that can sometimes be elevated in pain.  Elevated blood pressure this morning may have been due to the timing of your most recent blood pressure pill dose.  Please follow-up in 1 week so we can recheck blood pressure and recurrent symptoms.  Information below on leg swelling, but again I will check the blood clot test as above.   Peripheral Edema  Peripheral edema is swelling that is caused by a buildup of fluid. Peripheral edema most often affects the lower legs, ankles, and feet. It can also develop in the arms, hands, and face. The area of the body that has peripheral edema will look swollen. It may also feel heavy or warm. Your clothes may start to feel tight.  Pressing on the area may make a temporary dent in your skin. You may not be able to move your swollen arm or leg as much as usual. There are many causes of peripheral edema. It can happen because of a complication of other conditions such as congestive heart failure, kidney disease, or a problem with your blood circulation. It also can be a side effect of certain medicines or because of an infection. It often happens to women during pregnancy. Sometimes, the cause is not known. Follow these instructions at home: Managing pain, stiffness, and swelling   Raise (elevate) your legs while you are sitting or lying down.  Move around often to prevent stiffness and to lessen swelling.  Do not sit or stand for long periods of time.  Wear support stockings as told by your health care provider. Medicines  Take over-the-counter and prescription medicines only as told by your health care  provider.  Your health care provider may prescribe medicine to help your body get rid of excess water (diuretic). General instructions  Pay attention to any changes in your symptoms.  Follow instructions from your health care provider about limiting salt (sodium) in your diet. Sometimes, eating less salt may reduce swelling.  Moisturize skin daily to help prevent skin from cracking and draining.  Keep all follow-up visits as told by your health care provider. This is important. Contact a health care provider if you have:  A fever.  Edema that starts suddenly or is getting worse, especially if you are pregnant or have a medical condition.  Swelling in only one leg.  Increased swelling, redness, or pain in one or both of your legs.  Drainage or sores at the area where you have edema. Get help right away if you:  Develop shortness of breath, especially when you are lying down.  Have pain in your chest or abdomen.  Feel weak.  Feel faint. Summary  Peripheral edema is swelling that is caused by a  buildup of fluid. Peripheral edema most often affects the lower legs, ankles, and feet.  Move around often to prevent stiffness and to lessen swelling. Do not sit or stand for long periods of time.  Pay attention to any changes in your symptoms.  Contact a health care provider if you have edema that starts suddenly or is getting worse, especially if you are pregnant or have a medical condition.  Get help right away if you develop shortness of breath, especially when lying down. This information is not intended to replace advice given to you by your health care provider. Make sure you discuss any questions you have with your health care provider. Document Revised: 03/25/2018 Document Reviewed: 03/25/2018 Elsevier Patient Education  [place].   Nonspecific Chest Pain, Adult Chest pain can be caused by many different conditions. It can be caused by a condition that is life-threatening and requires treatment right away. It can also be caused by something that is not life-threatening. If you have chest pain, it can be hard to know the difference, so it is important to get help right away to make sure that you do not have a serious condition. Some life-threatening causes of chest pain include:  Heart attack.  A tear in the body's main blood vessel (aortic dissection).  Inflammation around your heart (pericarditis).  A problem in the lungs, such as a blood clot (pulmonary embolism) or a collapsed lung (pneumothorax). Some non life-threatening causes of chest pain include:  Heartburn.  Anxiety or stress.  Damage to the bones, muscles, and cartilage that make up your chest wall.  Pneumonia or bronchitis.  Shingles infection (varicella-zoster virus). Chest pain can feel like:  Pain or discomfort on the surface of your chest or deep in your chest.  Crushing, pressure, aching, or squeezing pain.  Burning or tingling.  Dull or sharp pain that is worse when you move, cough, or  take a deep breath.  Pain or discomfort that is also felt in your back, neck, jaw, shoulder, or arm, or pain that spreads to any of these areas. Your chest pain may come and go. It may also be constant. Your health care provider will do lab tests and other studies to find the cause of your pain. Treatment will depend on the cause of your chest pain. Follow these instructions at home: Medicines  Take over-the-counter and prescription medicines only as told by your health care provider.  If you were prescribed an antibiotic, take it as told by your health care provider. Do not stop taking the antibiotic even if you start to feel better. Lifestyle   Rest as directed by your health care provider.  Do not use any products that contain nicotine or tobacco, such as cigarettes and e-cigarettes. If you need help quitting, ask your health care provider.  Do not drink alcohol.  Make healthy lifestyle choices as recommended. These may include: ? Getting regular exercise. Ask your health care provider to suggest some activities that are safe for you. ? Eating a heart-healthy diet. This includes plenty of fresh fruits and vegetables, whole grains, low-fat (lean) protein, and low-fat dairy products. A dietitian can help you find healthy eating options. ? Maintaining a healthy weight. ? Managing any other health conditions you have, such as high blood pressure (hypertension) or diabetes. ? Reducing stress, such as with yoga or relaxation techniques. General instructions  Pay attention to any changes in your symptoms. Tell your health care provider about them or any new symptoms.  Avoid any activities that cause chest pain.  Keep all follow-up visits as told by your health care provider. This is important. This includes visits for any further testing if your chest pain does not go away. Contact a health care provider if:  Your chest pain does not go away.  You feel depressed.  You have a  fever. Get help right away if:  Your chest pain gets worse.  You have a cough that gets worse, or you cough up blood.  You have severe pain in your abdomen.  You faint.  You have sudden, unexplained chest discomfort.  You have sudden, unexplained discomfort in your arms, back, neck, or jaw.  You have shortness of breath at any time.  You suddenly start to sweat, or your skin gets clammy.  You feel nausea or you vomit.  You suddenly feel lightheaded or dizzy.  You have severe weakness, or unexplained weakness or fatigue.  Your heart begins to beat quickly, or it feels like it is skipping beats. These symptoms may represent a serious problem that is an emergency. Do not wait to see if the symptoms will go away. Get medical help right away. Call your local emergency services (911 in the [place] Do not drive yourself to the hospital. Summary  Chest pain can be caused by a condition that is serious and requires urgent treatment. It may also be caused by something that is not life-threatening.  If you have chest pain, it is very important to see your health care provider. Your health care provider may do lab tests and other studies to find the cause of your pain.  Follow your health care provider's instructions on taking medicines, making lifestyle changes, and getting emergency treatment if symptoms become worse.  Keep all follow-up visits as told by your health care provider. This includes visits for any further testing if your chest pain does not go away. This information is not intended to replace advice given to you by your health care provider. Make sure you discuss any questions you have with your health care provider. Document Revised: 01/01/2018 Document Reviewed: 01/01/2018 Elsevier Patient Education  [place].  Chest Wall Pain Chest wall pain is pain in or around the bones and muscles of your chest. Sometimes, an injury causes this pain. Excessive coughing or  overuse of arm and chest muscles may also cause chest wall pain. Sometimes, the cause may not be  known. This pain may take several weeks or longer to get better. Follow these instructions at home: Managing pain, stiffness, and swelling   If directed, put ice on the painful area: ? Put ice in a plastic bag. ? Place a towel between your skin and the bag. ? Leave the ice on for 20 minutes, 2-3 times per day. Activity  Rest as told by your health care provider.  Avoid activities that cause pain. These include any activities that use your chest muscles or your abdominal and side muscles to lift heavy items. Ask your health care provider what activities are safe for you. General instructions   Take over-the-counter and prescription medicines only as told by your health care provider.  Do not use any products that contain nicotine or tobacco, such as cigarettes, e-cigarettes, and chewing tobacco. These can delay healing after injury. If you need help quitting, ask your health care provider.  Keep all follow-up visits as told by your health care provider. This is important. Contact a health care provider if:  You have a fever.  Your chest pain becomes worse.  You have new symptoms. Get help right away if:  You have nausea or vomiting.  You feel sweaty or light-headed.  You have a cough with mucus from your lungs (sputum) or you cough up blood.  You develop shortness of breath. These symptoms may represent a serious problem that is an emergency. Do not wait to see if the symptoms will go away. Get medical help right away. Call your local emergency services (911 in the [place] Do not drive yourself to the hospital. Summary  Chest wall pain is pain in or around the bones and muscles of your chest.  Depending on the cause, it may be treated with ice, rest, medicines, and avoiding activities that cause pain.  Contact a health care provider if you have a fever, worsening chest pain, or  new symptoms.  Get help right away if you feel light-headed or you develop shortness of breath. These symptoms may be an emergency. This information is not intended to replace advice given to you by your health care provider. Make sure you discuss any questions you have with your health care provider. Document Revised: 01/01/2018 Document Reviewed: 01/01/2018 Elsevier Patient Education  [employer].     If you have lab work done today you will be contacted with your lab results within the next 2 weeks.  If you have not heard from [place] then please contact us. The fastest way to get your results is to register for My Chart.   IF you received an x-ray today, you will receive an invoice from [facility]. Please contact [facility] at [phone] with questions or concerns regarding your invoice.   IF you received labwork today, you will receive an invoice from [facility]. Please contact [facility] at [phone] with questions or concerns regarding your invoice.   Our billing staff will not be able to assist you with questions regarding bills from these companies.  You will be contacted with the lab results as soon as they are available. The fastest way to get your results is to activate your My Chart account. Instructions are located on the last page of this paperwork. If you have not heard from [place] regarding the results in 2 weeks, please contact this office.         Signed, [name], MD [facility]

## 2020-05-12 NOTE — Patient Instructions (Addendum)
Although I was able to reproduce some of your pain on exam, I would like to check a blood clot test as you have had some leg swelling and the right leg appears to be more swollen than the left.  Also possible risk factor of blood clot with recent hospital stay and previous breast cancer.  If the D-dimer is elevated, will likely need to have ultrasound and possible CT scan of the chest if you are still having symptoms.  This most likely will need to be done through the ER.  If you have any acute worsening of symptoms, proceed to the emergency room.  I prescribed 1 additional antibiotic, but if you have further difficulty taking that medication, let us know and we can look at other options.  Blood pressure was borderline elevated today, but previously looked okay and that can sometimes be elevated in pain.  Elevated blood pressure this morning may have been due to the timing of your most recent blood pressure pill dose.  Please follow-up in 1 week so we can recheck blood pressure and recurrent symptoms.  Information below on leg swelling, but again I will check the blood clot test as above.   Peripheral Edema  Peripheral edema is swelling that is caused by a buildup of fluid. Peripheral edema most often affects the lower legs, ankles, and feet. It can also develop in the arms, hands, and face. The area of the body that has peripheral edema will look swollen. It may also feel heavy or warm. Your clothes may start to feel tight. Pressing on the area may make a temporary dent in your skin. You may not be able to move your swollen arm or leg as much as usual. There are many causes of peripheral edema. It can happen because of a complication of other conditions such as congestive heart failure, kidney disease, or a problem with your blood circulation. It also can be a side effect of certain medicines or because of an infection. It often happens to women during pregnancy. Sometimes, the cause is not  known. Follow these instructions at home: Managing pain, stiffness, and swelling   Raise (elevate) your legs while you are sitting or lying down.  Move around often to prevent stiffness and to lessen swelling.  Do not sit or stand for long periods of time.  Wear support stockings as told by your health care provider. Medicines  Take over-the-counter and prescription medicines only as told by your health care provider.  Your health care provider may prescribe medicine to help your body get rid of excess water (diuretic). General instructions  Pay attention to any changes in your symptoms.  Follow instructions from your health care provider about limiting salt (sodium) in your diet. Sometimes, eating less salt may reduce swelling.  Moisturize skin daily to help prevent skin from cracking and draining.  Keep all follow-up visits as told by your health care provider. This is important. Contact a health care provider if you have:  A fever.  Edema that starts suddenly or is getting worse, especially if you are pregnant or have a medical condition.  Swelling in only one leg.  Increased swelling, redness, or pain in one or both of your legs.  Drainage or sores at the area where you have edema. Get help right away if you:  Develop shortness of breath, especially when you are lying down.  Have pain in your chest or abdomen.  Feel weak.  Feel faint. Summary  Peripheral edema  is swelling that is caused by a buildup of fluid. Peripheral edema most often affects the lower legs, ankles, and feet.  Move around often to prevent stiffness and to lessen swelling. Do not sit or stand for long periods of time.  Pay attention to any changes in your symptoms.  Contact a health care provider if you have edema that starts suddenly or is getting worse, especially if you are pregnant or have a medical condition.  Get help right away if you develop shortness of breath, especially when  lying down. This information is not intended to replace advice given to you by your health care provider. Make sure you discuss any questions you have with your health care provider. Document Revised: 03/25/2018 Document Reviewed: 03/25/2018 Elsevier Patient Education  [place].   Nonspecific Chest Pain, Adult Chest pain can be caused by many different conditions. It can be caused by a condition that is life-threatening and requires treatment right away. It can also be caused by something that is not life-threatening. If you have chest pain, it can be hard to know the difference, so it is important to get help right away to make sure that you do not have a serious condition. Some life-threatening causes of chest pain include:  Heart attack.  A tear in the body's main blood vessel (aortic dissection).  Inflammation around your heart (pericarditis).  A problem in the lungs, such as a blood clot (pulmonary embolism) or a collapsed lung (pneumothorax). Some non life-threatening causes of chest pain include:  Heartburn.  Anxiety or stress.  Damage to the bones, muscles, and cartilage that make up your chest wall.  Pneumonia or bronchitis.  Shingles infection (varicella-zoster virus). Chest pain can feel like:  Pain or discomfort on the surface of your chest or deep in your chest.  Crushing, pressure, aching, or squeezing pain.  Burning or tingling.  Dull or sharp pain that is worse when you move, cough, or take a deep breath.  Pain or discomfort that is also felt in your back, neck, jaw, shoulder, or arm, or pain that spreads to any of these areas. Your chest pain may come and go. It may also be constant. Your health care provider will do lab tests and other studies to find the cause of your pain. Treatment will depend on the cause of your chest pain. Follow these instructions at home: Medicines  Take over-the-counter and prescription medicines only as told by your  health care provider.  If you were prescribed an antibiotic, take it as told by your health care provider. Do not stop taking the antibiotic even if you start to feel better. Lifestyle   Rest as directed by your health care provider.  Do not use any products that contain nicotine or tobacco, such as cigarettes and e-cigarettes. If you need help quitting, ask your health care provider.  Do not drink alcohol.  Make healthy lifestyle choices as recommended. These may include: ? Getting regular exercise. Ask your health care provider to suggest some activities that are safe for you. ? Eating a heart-healthy diet. This includes plenty of fresh fruits and vegetables, whole grains, low-fat (lean) protein, and low-fat dairy products. A dietitian can help you find healthy eating options. ? Maintaining a healthy weight. ? Managing any other health conditions you have, such as high blood pressure (hypertension) or diabetes. ? Reducing stress, such as with yoga or relaxation techniques. General instructions  Pay attention to any changes in your symptoms. Tell your  health care provider about them or any new symptoms.  Avoid any activities that cause chest pain.  Keep all follow-up visits as told by your health care provider. This is important. This includes visits for any further testing if your chest pain does not go away. Contact a health care provider if:  Your chest pain does not go away.  You feel depressed.  You have a fever. Get help right away if:  Your chest pain gets worse.  You have a cough that gets worse, or you cough up blood.  You have severe pain in your abdomen.  You faint.  You have sudden, unexplained chest discomfort.  You have sudden, unexplained discomfort in your arms, back, neck, or jaw.  You have shortness of breath at any time.  You suddenly start to sweat, or your skin gets clammy.  You feel nausea or you vomit.  You suddenly feel lightheaded or  dizzy.  You have severe weakness, or unexplained weakness or fatigue.  Your heart begins to beat quickly, or it feels like it is skipping beats. These symptoms may represent a serious problem that is an emergency. Do not wait to see if the symptoms will go away. Get medical help right away. Call your local emergency services (911 in the [place] Do not drive yourself to the hospital. Summary  Chest pain can be caused by a condition that is serious and requires urgent treatment. It may also be caused by something that is not life-threatening.  If you have chest pain, it is very important to see your health care provider. Your health care provider may do lab tests and other studies to find the cause of your pain.  Follow your health care provider's instructions on taking medicines, making lifestyle changes, and getting emergency treatment if symptoms become worse.  Keep all follow-up visits as told by your health care provider. This includes visits for any further testing if your chest pain does not go away. This information is not intended to replace advice given to you by your health care provider. Make sure you discuss any questions you have with your health care provider. Document Revised: 01/01/2018 Document Reviewed: 01/01/2018 Elsevier Patient Education  [place].  Chest Wall Pain Chest wall pain is pain in or around the bones and muscles of your chest. Sometimes, an injury causes this pain. Excessive coughing or overuse of arm and chest muscles may also cause chest wall pain. Sometimes, the cause may not be known. This pain may take several weeks or longer to get better. Follow these instructions at home: Managing pain, stiffness, and swelling   If directed, put ice on the painful area: ? Put ice in a plastic bag. ? Place a towel between your skin and the bag. ? Leave the ice on for 20 minutes, 2-3 times per day. Activity  Rest as told by your health care  provider.  Avoid activities that cause pain. These include any activities that use your chest muscles or your abdominal and side muscles to lift heavy items. Ask your health care provider what activities are safe for you. General instructions   Take over-the-counter and prescription medicines only as told by your health care provider.  Do not use any products that contain nicotine or tobacco, such as cigarettes, e-cigarettes, and chewing tobacco. These can delay healing after injury. If you need help quitting, ask your health care provider.  Keep all follow-up visits as told by your health care provider. This is important.  Contact a health care provider if:  You have a fever.  Your chest pain becomes worse.  You have new symptoms. Get help right away if:  You have nausea or vomiting.  You feel sweaty or light-headed.  You have a cough with mucus from your lungs (sputum) or you cough up blood.  You develop shortness of breath. These symptoms may represent a serious problem that is an emergency. Do not wait to see if the symptoms will go away. Get medical help right away. Call your local emergency services (911 in the [place] Do not drive yourself to the hospital. Summary  Chest wall pain is pain in or around the bones and muscles of your chest.  Depending on the cause, it may be treated with ice, rest, medicines, and avoiding activities that cause pain.  Contact a health care provider if you have a fever, worsening chest pain, or new symptoms.  Get help right away if you feel light-headed or you develop shortness of breath. These symptoms may be an emergency. This information is not intended to replace advice given to you by your health care provider. Make sure you discuss any questions you have with your health care provider. Document Revised: 01/01/2018 Document Reviewed: 01/01/2018 Elsevier Patient Education  [employer].     If you have lab work done today you  will be contacted with your lab results within the next 2 weeks.  If you have not heard from [place] then please contact us. The fastest way to get your results is to register for My Chart.   IF you received an x-ray today, you will receive an invoice from [facility]. Please contact [facility] at [phone] with questions or concerns regarding your invoice.   IF you received labwork today, you will receive an invoice from [facility]. Please contact [facility] at [phone] with questions or concerns regarding your invoice.   Our billing staff will not be able to assist you with questions regarding bills from these companies.  You will be contacted with the lab results as soon as they are available. The fastest way to get your results is to activate your My Chart account. Instructions are located on the last page of this paperwork. If you have not heard from [place] regarding the results in 2 weeks, please contact this office.

## 2020-05-13 LAB — D-DIMER, QUANTITATIVE: D-DIMER: 2.41 mg/L FEU — ABNORMAL HIGH (ref 0.00–0.49)

## 2020-05-15 ENCOUNTER — Telehealth: Payer: Self-pay | Admitting: Family Medicine

## 2020-05-15 ENCOUNTER — Telehealth: Payer: Self-pay | Admitting: Pulmonary Disease

## 2020-05-15 ENCOUNTER — Encounter: Payer: Self-pay | Admitting: Family Medicine

## 2020-05-15 NOTE — Telephone Encounter (Signed)
Pt. Called to inquire about lab result from Friday.  Stated she was to receive a call over the weekend with results and further recommendations but did not get a call.  Noted elevated D-dimer.  Pt. Denied chest pain at present.  Stated she has had shortness of breath.  Able to speak in complete sentences.  Advised will transfer her to the office to speak with nurse.  Phone call to [employer]  Spoke with [employer].  Transferred patient to speak with Dr. Carlota Raspberry.

## 2020-05-15 NOTE — Telephone Encounter (Signed)
She should follow through with what her primary care physician wanted.  Dopplers do not add radiation.  However, Dopplers do not exclude the potential for a PE and I recommend that she follow the recommendations of her primary care physician which was to get a CT angio for PE.  Since they have started that particular type of work-up they should order the tests as they do have documentation for all the lab tests and visits.

## 2020-05-15 NOTE — Telephone Encounter (Signed)
Pt states that based off of an elevated d-dimer, pt pcp wants her to get ct adn ultra sound. Pt states she would like to know what Dr. Darnell Level suggest since she just had one. She is concerned about too much radiation. --  Pt states her pcp will call back in 1 hour and would like for [place] to call back before then if possible.

## 2020-05-15 NOTE — Telephone Encounter (Signed)
Patient is aware of below recommendations and voiced her understanding.  Nothing further needed.   

## 2020-05-15 NOTE — Telephone Encounter (Signed)
Called and spoken to patient.  patient stated that she had elevated d dimer with PCP. Patient was admitted, where CT was preformed. Sob is baseline per patient.  She is experiencing bilateral leg pain. No edema.  Leg pain developed when getting off bed after CT. Patient is concerned about amount of radiation but is questioning if she should have a doppler.   Dr. Patsey Berthold, please advise. Thanks

## 2020-05-16 ENCOUNTER — Other Ambulatory Visit: Payer: Self-pay

## 2020-05-16 ENCOUNTER — Telehealth: Payer: Self-pay

## 2020-05-16 ENCOUNTER — Ambulatory Visit (HOSPITAL_COMMUNITY)
Admission: RE | Admit: 2020-05-16 | Discharge: 2020-05-16 | Disposition: A | Discharge status: Home / Self Care | Payer: Medicare Other | Source: Ambulatory Visit | Attending: Family Medicine | Admitting: Family Medicine

## 2020-05-16 DIAGNOSIS — R7989 Other specified abnormal findings of blood chemistry: Secondary | ICD-10-CM | POA: Diagnosis not present

## 2020-05-16 DIAGNOSIS — M7989 Other specified soft tissue disorders: Secondary | ICD-10-CM | POA: Diagnosis not present

## 2020-05-16 NOTE — Telephone Encounter (Signed)
Prior note interrupted with Epic restart.   See lab notes re: ultrasound and plan.

## 2020-05-16 NOTE — Telephone Encounter (Signed)
seesee

## 2020-05-17 ENCOUNTER — Telehealth: Payer: Self-pay | Admitting: Family Medicine

## 2020-05-17 ENCOUNTER — Ambulatory Visit (INDEPENDENT_AMBULATORY_CARE_PROVIDER_SITE_OTHER): Payer: Medicare Other | Admitting: Podiatry

## 2020-05-17 ENCOUNTER — Encounter: Payer: Self-pay | Admitting: Podiatry

## 2020-05-17 DIAGNOSIS — M722 Plantar fascial fibromatosis: Secondary | ICD-10-CM

## 2020-05-17 NOTE — Progress Notes (Signed)
She presents today for follow-up of plantar fasciitis to her left heel.  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  She states that she has not been using anything and she is even scared to use her Voltaren gel.  She states that is outdated but it seemed to help a little bit and then she became concerned that she is not supposed to use anti-inflammatories.  Objective: Vital signs are stable she is alert oriented x3.  Pulses are palpable.  She has mild tenderness on palpation medial calcaneal tubercle but much decreased from previous evaluation.  Assessment: Slowly resolving plantar fasciitis left.  Plan: I instructed her to obtain a new tube of Voltaren gel from the pharmacy and start utilizing that.  If this fails to alleviate her symptoms we will need to get an MRI of this foot to consider surgical intervention.

## 2020-05-17 NOTE — Telephone Encounter (Signed)
Pt would like a call to see if she needs to be taking aspirin? Patient would like a call to go over her ultrasound on legs that she had done yesterday .   Please advise

## 2020-05-17 NOTE — Telephone Encounter (Signed)
I have called pt and clarified the results. She stated understanding.

## 2020-05-17 NOTE — Telephone Encounter (Signed)
Pt states she is returning [place] call to go over results

## 2020-05-19 DIAGNOSIS — N201 Calculus of ureter: Secondary | ICD-10-CM | POA: Diagnosis not present

## 2020-05-19 DIAGNOSIS — R1084 Generalized abdominal pain: Secondary | ICD-10-CM | POA: Diagnosis not present

## 2020-05-22 ENCOUNTER — Ambulatory Visit: Payer: Medicare Other | Admitting: Podiatry

## 2020-05-23 ENCOUNTER — Ambulatory Visit: Payer: Medicare Other | Admitting: Nurse Practitioner

## 2020-05-29 ENCOUNTER — Ambulatory Visit (INDEPENDENT_AMBULATORY_CARE_PROVIDER_SITE_OTHER): Payer: Medicare Other | Admitting: Nurse Practitioner

## 2020-05-29 ENCOUNTER — Encounter: Payer: Self-pay | Admitting: Nurse Practitioner

## 2020-05-29 ENCOUNTER — Other Ambulatory Visit: Payer: Self-pay

## 2020-05-29 VITALS — BP 138/84 | HR 70 | Temp 97.7°F | Ht 64.5 in | Wt 126.4 lb

## 2020-05-29 DIAGNOSIS — I10 Essential (primary) hypertension: Secondary | ICD-10-CM | POA: Diagnosis not present

## 2020-05-29 DIAGNOSIS — R072 Precordial pain: Secondary | ICD-10-CM | POA: Diagnosis not present

## 2020-05-29 DIAGNOSIS — Z7189 Other specified counseling: Secondary | ICD-10-CM | POA: Insufficient documentation

## 2020-05-29 DIAGNOSIS — Z0000 Encounter for general adult medical examination without abnormal findings: Secondary | ICD-10-CM | POA: Insufficient documentation

## 2020-05-29 DIAGNOSIS — Z853 Personal history of malignant neoplasm of breast: Secondary | ICD-10-CM

## 2020-05-29 DIAGNOSIS — Z23 Encounter for immunization: Secondary | ICD-10-CM

## 2020-05-29 DIAGNOSIS — N200 Calculus of kidney: Secondary | ICD-10-CM

## 2020-05-29 DIAGNOSIS — K5792 Diverticulitis of intestine, part unspecified, without perforation or abscess without bleeding: Secondary | ICD-10-CM

## 2020-05-29 DIAGNOSIS — Z7689 Persons encountering health services in other specified circumstances: Secondary | ICD-10-CM | POA: Insufficient documentation

## 2020-05-29 DIAGNOSIS — E782 Mixed hyperlipidemia: Secondary | ICD-10-CM

## 2020-05-29 HISTORY — DX: Encounter for general adult medical examination without abnormal findings: Z00.00

## 2020-05-29 HISTORY — DX: Other specified counseling: Z71.89

## 2020-05-29 MED ORDER — ZOSTER VAC RECOMB ADJUVANTED 50 MCG/0.5ML IM SUSR: 0.5000 mL | Freq: Once | INTRAMUSCULAR | 1 refills | Status: AC

## 2020-05-29 NOTE — Assessment & Plan Note (Signed)
Pleasant patient presenting to establish care. I did inform her that I will be moving to the new facility in [place] in February, she was aware of this move and plans to follow me when we open. For now, she will be seen in this office.  Up to date on colonoscopy, mammogram, pneumonia vaccine, and COVID vaccines.  Discussed flu vaccine today- she will think about this- declines today.  Discussed COVID booster- she will think about this.  Discussed Shingles vaccination and provided with written prescription for pharmacy.  She is up to date on her lab work- recent hospitalization did show some abnormalities on her labs, but it is too soon to follow-up on these.  Plan for visit in [place] of next year for labs and annual medicare wellness visit- initial.  Patient provided with contact information for acute needs between now and next visit.

## 2020-05-29 NOTE — Assessment & Plan Note (Signed)
Diet managed.  Labs due in [place] of next year.  No changes to plan of care at this time.  Recommend diet low in carbohydrate and fats.

## 2020-05-29 NOTE — Progress Notes (Signed)
New Patient Office Visit  Subjective:  Patient ID: Cheyenne Sanders, female    DOB: [birth year]  Age: 69 y.o. MRN: [id number]  CC:  Chief Complaint  Patient presents with  . Establish Care    HPI Cheyenne Sanders is a 69 year old female presenting today to establish care.   She tells me she has had some health issues lately. She was hospitalized recently with an obstructive kidney stone that eventually passed without surgical intervention. She is followed by Daine Gravel, DNP, AGNP-c at [facility] for this condition. She reports that she is not having any current issues with pain, hematuria, decreased urine output, or urinary frequency.   Within the last 3 months she also experienced a shingles outbreak on the left side of her scalp. She reports the infection healed well with no residual nerve pain. She did not have eye involvement.   Her medical history is positive for breast cancer twice. She tells me in 1999 she had a lumpectomy on the right breast for breast cancer followed by chemotherapy and radiation. In 2014 a different form of breast cancer presented in the same breast and she had a mastectomy at that time. She has regular mammograms in the left breast, her most recent was last month, and it was reportedly normal.   She has a history of hysterectomy with the cervix removed, but ovaries remain in place. She had her last gynecological exam last month.   She tells me she is due for her colonoscopy next month, she has them every 5 years. She reports that she plans to wait until January to have this done.   She has had two COVID vaccines, but is on the fence about the booster at this time. She is also on the fence about the flu vaccination this year given that she is limiting her time in public and wears a mask every where she goes.   She tells me she has chronic left shoulder pain and was once told there was an issue with the bone, but she does not wish to have surgery. She  also is dealing with plantar fascitis of the left foot and is seeing orthopedics for this condition.   She recently experienced one sided lower extremity edema without pain, redness, or weeping. She reports that her previous provider ordered testing and encouraged her to have a CT to rule out blood clot. She tells me that she choose not to have this done when the dopplers on her legs were negative and the edema resolved by propping her legs up.   Past Medical History:  Diagnosis Date  . Breast cancer ([place]) 3976,7341   R breast cancer x 2; lumpectomy with radiation, chemotherapy; mastectomy R.  . Colon polyps   . Hyperlipidemia   . Hypertension   . Nephrolithiasis   . Personal history of chemotherapy   . Personal history of radiation therapy   . Pneumonia     Past Surgical History:  Procedure Laterality Date  . ABDOMINAL HYSTERECTOMY    . BREAST BIOPSY Left 2016  . BREAST LUMPECTOMY Right 1999  . MASTECTOMY Right    2014  . PARTIAL HYSTERECTOMY  1986   DUB; ovaries intact.    Family History  Problem Relation Age of Onset  . Heart disease Mother   . Colon cancer Maternal Grandfather   . Heart attack Maternal Grandfather        or ? stroke  . Colon cancer Maternal Aunt   .  Thyroid disease Son   . Breast cancer Neg Hx     Social History   Socioeconomic History  . Marital status: Widowed    Spouse name: Not on file  . Number of children: 2  . Years of education: Not on file  . Highest education level: Not on file  Occupational History  . Occupation: retired  Tobacco Use  . Smoking status: Never Smoker  . Smokeless tobacco: Never Used  Vaping Use  . Vaping Use: Never used  Substance and Sexual Activity  . Alcohol use: Yes    Comment: rarely  . Drug use: Never  . Sexual activity: Not Currently    Partners: Male  Other Topics Concern  . 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      Tobacco: none      Alcohol: rare; social      Exercise: no formal exercise.        ADLs: independent with ADLs   Social Determinants of Health   Financial Resource Strain:   . Difficulty of Paying Living Expenses: Not on file  Food Insecurity:   . Worried About Charity fundraiser in the Last Year: Not on file  . Ran Out of Food in the Last Year: Not on file  Transportation Needs:   . Lack of Transportation (Medical): Not on file  . Lack of Transportation (Non-Medical): Not on file  Physical Activity:   . Days of Exercise per Week: Not on file  . Minutes of Exercise per Session: Not on file  Stress:   . Feeling of Stress : Not on file  Social Connections:   . Frequency of Communication with Friends and Family: Not on file  . Frequency of Social Gatherings with Friends and Family: Not on file  . Attends Religious Services: Not on file  . Active Member of Clubs or Organizations: Not on file  . Attends Archivist Meetings: Not on file  . Marital Status: Not on file  Intimate Partner Violence:   . Fear of Current or Ex-Partner: Not on file  . Emotionally Abused: Not on file  . Physically Abused: Not on file  . Sexually Abused: Not on file    ROS Review of Systems  All review of systems negative except what is listed in the HPI   Objective:   Today's Vitals: BP 138/84   Pulse 70   Temp 97.7 F (36.5 C) (Oral)   Ht 5' 4.5" (1.638 m)   Wt 126 lb 6.4 oz (57.3 kg)   SpO2 100%   BMI 21.36 kg/m   Physical Exam Vitals and nursing note reviewed.  Constitutional:      Appearance: Normal appearance. She is normal weight.  HENT:     Head: Normocephalic.     Right Ear: Tympanic membrane, ear canal and external ear normal.     Left Ear: Tympanic membrane, ear canal and external ear normal.  Eyes:      Extraocular Movements: Extraocular movements intact.     Conjunctiva/sclera: Conjunctivae normal.     Pupils: Pupils are equal, round, and reactive to light.  Neck:     Vascular: No carotid bruit.  Cardiovascular:  Rate and Rhythm: Normal rate and regular rhythm.     Pulses: Normal pulses.     Heart sounds: Normal heart sounds. No murmur heard.  No friction rub.  Pulmonary:     Effort: Pulmonary effort is normal.     Breath sounds: Normal breath sounds.  Abdominal:     General: Abdomen is flat. Bowel sounds are normal. There is no distension.     Palpations: Abdomen is soft.     Tenderness: There is no abdominal tenderness. There is no right CVA tenderness, left CVA tenderness, guarding or rebound.  Musculoskeletal:        General: Tenderness and signs of injury present.       Arms:     Cervical back: Normal range of motion and neck supple. No rigidity or tenderness.     Right lower leg: No edema.     Left lower leg: No edema.       Legs:  Lymphadenopathy:     Cervical: No cervical adenopathy.  Skin:    General: Skin is warm and dry.     Capillary Refill: Capillary refill takes less than 2 seconds.  Neurological:     General: No focal deficit present.     Mental Status: She is alert and oriented to person, place, and time.     Cranial Nerves: No cranial nerve deficit.     Sensory: No sensory deficit.     Motor: Weakness present.     Coordination: Coordination normal.     Gait: Gait normal.     Deep Tendon Reflexes: Reflexes normal.  Psychiatric:        Mood and Affect: Mood normal.        Behavior: Behavior normal.        Thought Content: Thought content normal.        Judgment: Judgment normal.     Assessment & Plan:   Problem List Items Addressed This Visit      Cardiovascular and Mediastinum   Essential hypertension    Blood pressure 138/84 in the office today. She is due to take her medication.  No changes today.  Labs due in January/February.    Follow-up sooner if needed.         Genitourinary   Recurrent nephrolithiasis    Recent kidney stone has passed. No symptoms currently.  Continue to follow with Urology as needed.  Follow-up if needed for new or recurrent symptoms.         Other   Personal history of breast cancer    Right mastectomy due to breast cancer x2.  Mammogram up to date. No concerns today.       Mixed hyperlipidemia    Diet managed.  Labs due in [place] of next year.  No changes to plan of care at this time.  Recommend diet low in carbohydrate and fats.       Precordial chest pain    History of pleuracy with chest pain. No symptoms present today.  Follow-up if new or recurrent symptoms occur.       Diverticulitis    Colonoscopy due next month. No current issues or complaints.  Will review colonoscopy results and update patient as needed.  Follow-up if symptoms develop.       Encounter for medical examination to establish care - Primary    Pleasant patient presenting to establish care. I did inform her that I will be moving to the new facility in [place] in February, she was  aware of this move and plans to follow me when we open. For now, she will be seen in this office.  Up to date on colonoscopy, mammogram, pneumonia vaccine, and COVID vaccines.  Discussed flu vaccine today- she will think about this- declines today.  Discussed COVID booster- she will think about this.  Discussed Shingles vaccination and provided with written prescription for pharmacy.  She is up to date on her lab work- recent hospitalization did show some abnormalities on her labs, but it is too soon to follow-up on these.  Plan for visit in [place] of next year for labs and annual medicare wellness visit- initial.  Patient provided with contact information for acute needs between now and next visit.        Other Visit Diagnoses    Need for shingles vaccine       Relevant Medications   Zoster  Vaccine Adjuvanted [facility]) injection      Outpatient Encounter Medications as of 05/29/2020  Medication Sig  . Metoprolol Succinate 25 MG CS24 Take 25 mg by mouth daily.  . ondansetron (ZOFRAN) 4 MG tablet Take 1 tablet (4 mg total) by mouth every 6 (six) hours as needed for nausea.  . tamsulosin (FLOMAX) 0.4 MG CAPS capsule Take 1 capsule (0.4 mg total) by mouth daily. (Patient not taking: Reported on 05/29/2020)  . Zoster Vaccine Adjuvanted [facility]) injection Inject 0.5 mLs into the muscle once for 1 dose. Repeat in 2-6 months. Please fax confirmation of vaccination to Worthy Keeler, DNP  [phone]   No facility-administered encounter medications on file as of 05/29/2020.    Follow-up: Return in about 3 months (around 08/29/2020) for annual labs.   Orma Render, NP

## 2020-05-29 NOTE — Assessment & Plan Note (Signed)
Right mastectomy due to breast cancer x2.  Mammogram up to date. No concerns today.

## 2020-05-29 NOTE — Assessment & Plan Note (Signed)
History of pleuracy with chest pain. No symptoms present today.  Follow-up if new or recurrent symptoms occur.

## 2020-05-29 NOTE — Patient Instructions (Signed)
It was a pleasure meeting you today! Please let me know if you have any issues and I will be happy to work with you to help you feel better.    Health Maintenance After Age 67 After age 78, you are at a higher risk for certain long-term diseases and infections as well as injuries from falls. Falls are a major cause of broken bones and head injuries in people who are older than age 4. Getting regular preventive care can help to keep you healthy and well. Preventive care includes getting regular testing and making lifestyle changes as recommended by your health care provider. Talk with your health care provider about:  Which screenings and tests you should have. A screening is a test that checks for a disease when you have no symptoms.  A diet and exercise plan that is right for you. What should I know about screenings and tests to prevent falls? Screening and testing are the best ways to find a health problem Cheyenne Sanders. Cheyenne Sanders diagnosis and treatment give you the best chance of managing medical conditions that are common after age 25. Certain conditions and lifestyle choices may make you more likely to have a fall. Your health care provider may recommend:  Regular vision checks. Poor vision and conditions such as cataracts can make you more likely to have a fall. If you wear glasses, make sure to get your prescription updated if your vision changes.  Medicine review. Work with your health care provider to regularly review all of the medicines you are taking, including over-the-counter medicines. Ask your health care provider about any side effects that may make you more likely to have a fall. Tell your health care provider if any medicines that you take make you feel dizzy or sleepy.  Osteoporosis screening. Osteoporosis is a condition that causes the bones to get weaker. This can make the bones weak and cause them to break more easily.  Blood pressure screening. Blood pressure changes and medicines to  control blood pressure can make you feel dizzy.  Strength and balance checks. Your health care provider may recommend certain tests to check your strength and balance while standing, walking, or changing positions.  Foot health exam. Foot pain and numbness, as well as not wearing proper footwear, can make you more likely to have a fall.  Depression screening. You may be more likely to have a fall if you have a fear of falling, feel emotionally low, or feel unable to do activities that you used to do.  Alcohol use screening. Using too much alcohol can affect your balance and may make you more likely to have a fall. What actions can I take to lower my risk of falls? General instructions  Talk with your health care provider about your risks for falling. Tell your health care provider if: ? You fall. Be sure to tell your health care provider about all falls, even ones that seem minor. ? You feel dizzy, sleepy, or off-balance.  Take over-the-counter and prescription medicines only as told by your health care provider. These include any supplements.  Eat a healthy diet and maintain a healthy weight. A healthy diet includes low-fat dairy products, low-fat (lean) meats, and fiber from whole grains, beans, and lots of fruits and vegetables. Home safety  Remove any tripping hazards, such as rugs, cords, and clutter.  Install safety equipment such as grab bars in bathrooms and safety rails on stairs.  Keep rooms and walkways well-lit. Activity   Follow a  regular exercise program to stay fit. This will help you maintain your balance. Ask your health care provider what types of exercise are appropriate for you.  If you need a cane or walker, use it as recommended by your health care provider.  Wear supportive shoes that have nonskid soles. Lifestyle  Do not drink alcohol if your health care provider tells you not to drink.  If you drink alcohol, limit how much you have: ? 0-1 drink a day  for women. ? 0-2 drinks a day for men.  Be aware of how much alcohol is in your drink. In the [place] one drink equals one typical bottle of beer (12 oz), one-half glass of wine (5 oz), or one shot of hard liquor (1 oz).  Do not use any products that contain nicotine or tobacco, such as cigarettes and e-cigarettes. If you need help quitting, ask your health care provider. Summary  Having a healthy lifestyle and getting preventive care can help to protect your health and wellness after age 69.  Screening and testing are the best way to find a health problem Cheyenne Sanders and help you avoid having a fall. Cheyenne Sanders diagnosis and treatment give you the best chance for managing medical conditions that are more common for people who are older than age 44.  Falls are a major cause of broken bones and head injuries in people who are older than age 55. Take precautions to prevent a fall at home.  Work with your health care provider to learn what changes you can make to improve your health and wellness and to prevent falls. This information is not intended to replace advice given to you by your health care provider. Make sure you discuss any questions you have with your health care provider. Document Revised: 10/22/2018 Document Reviewed: 05/14/2017 Elsevier Patient Education  2020 [employer].

## 2020-05-29 NOTE — Assessment & Plan Note (Signed)
Colonoscopy due next month. No current issues or complaints.  Will review colonoscopy results and update patient as needed.  Follow-up if symptoms develop.

## 2020-05-29 NOTE — Assessment & Plan Note (Signed)
Recent kidney stone has passed. No symptoms currently.  Continue to follow with Urology as needed.  Follow-up if needed for new or recurrent symptoms.

## 2020-05-29 NOTE — Assessment & Plan Note (Signed)
Blood pressure 138/84 in the office today. She is due to take her medication.  No changes today.  Labs due in January/February.  Follow-up sooner if needed.

## 2020-05-31 ENCOUNTER — Ambulatory Visit (INDEPENDENT_AMBULATORY_CARE_PROVIDER_SITE_OTHER): Payer: Medicare Other | Admitting: Podiatry

## 2020-05-31 ENCOUNTER — Other Ambulatory Visit: Payer: Self-pay

## 2020-05-31 ENCOUNTER — Encounter: Payer: Self-pay | Admitting: Podiatry

## 2020-05-31 DIAGNOSIS — M722 Plantar fascial fibromatosis: Secondary | ICD-10-CM

## 2020-05-31 NOTE — Progress Notes (Signed)
She presents today for follow-up of her left heel.  States is not doing too good I still have pain wearing the brace and using the gel it helps to some degree.  I have been icing some and soaking it.  It is real sensitive to the touch on the inside of this heel.  Objective: Vital signs are stable alert and oriented x3.  Pulses are palpable.  She no longer has pain on palpation of the central calcaneal tubercle or the medial aspect of the medial calcaneal tubercle.  She does have allodynic type pain to the medial aspect of the heel from about the medial malleolus posteriorly and inferiorly to the plantar surface.  But no reproducible plantar fasciitis/plantar pain as previously noted.  Assessment: Plantar fasciitis is resolving.  Plan: I did offer her an MRI she declined she would like to try a night splint.  Recommended that she continue doing everything she is doing and we will follow-up with her in about a month to make sure the night splint is working for her.

## 2020-06-06 ENCOUNTER — Telehealth: Payer: Self-pay | Admitting: Podiatry

## 2020-06-06 ENCOUNTER — Ambulatory Visit: Payer: Medicare Other | Admitting: Podiatry

## 2020-06-06 NOTE — Telephone Encounter (Signed)
Pt previously had a bad experience with an MRI a few months ago. She would like to know if she could get an ultrasound instead. Please advise.

## 2020-06-06 NOTE — Telephone Encounter (Signed)
You can try an ultra sound.  Not very specific.

## 2020-06-07 NOTE — Telephone Encounter (Signed)
Can you send an order in for an ultrasound instead of MRI please?

## 2020-06-14 ENCOUNTER — Encounter: Payer: Self-pay | Admitting: Podiatry

## 2020-06-14 ENCOUNTER — Other Ambulatory Visit: Payer: Self-pay

## 2020-06-14 ENCOUNTER — Ambulatory Visit (INDEPENDENT_AMBULATORY_CARE_PROVIDER_SITE_OTHER): Payer: Medicare Other | Admitting: Podiatry

## 2020-06-14 DIAGNOSIS — M722 Plantar fascial fibromatosis: Secondary | ICD-10-CM | POA: Diagnosis not present

## 2020-06-14 DIAGNOSIS — S93692A Other sprain of left foot, initial encounter: Secondary | ICD-10-CM

## 2020-06-14 NOTE — Progress Notes (Signed)
She presents today for follow-up of her left heel states that is really not good at all has not gotten any better at all of been trying the Voltaren gel and nothing seems to be working.  Continue to use the plantar fascial brace.  Objective: Vital signs are stable alert and oriented x3.  Pulses are palpable.  There is no erythema edema cellulitis drainage or odor the medial band of the plantar fascia appears to have ruptured at some point.  She has pain on palpation medial calcaneal tubercle of the left heel.  No pain on medial lateral compression of the calcaneus.  Assessment: Chronic intractable plantar fasciitis possible rupture of the medial band of the plantar fascial left.  Plan: At this point requesting MRI of the left rear foot continue all conservative therapies.

## 2020-06-19 ENCOUNTER — Other Ambulatory Visit: Payer: Medicare Other

## 2020-06-19 ENCOUNTER — Other Ambulatory Visit: Payer: Self-pay

## 2020-06-19 ENCOUNTER — Ambulatory Visit
Admission: RE | Admit: 2020-06-19 | Discharge: 2020-06-19 | Disposition: A | Discharge status: Home / Self Care | Payer: Medicare Other | Source: Ambulatory Visit | Attending: Podiatry | Admitting: Podiatry

## 2020-06-19 DIAGNOSIS — M19072 Primary osteoarthritis, left ankle and foot: Secondary | ICD-10-CM | POA: Diagnosis not present

## 2020-06-19 DIAGNOSIS — S86812A Strain of other muscle(s) and tendon(s) at lower leg level, left leg, initial encounter: Secondary | ICD-10-CM | POA: Diagnosis not present

## 2020-06-19 DIAGNOSIS — M722 Plantar fascial fibromatosis: Secondary | ICD-10-CM | POA: Diagnosis not present

## 2020-06-19 DIAGNOSIS — S93692A Other sprain of left foot, initial encounter: Secondary | ICD-10-CM

## 2020-06-19 DIAGNOSIS — M25472 Effusion, left ankle: Secondary | ICD-10-CM | POA: Diagnosis not present

## 2020-06-19 IMAGING — MR MR ANKLE*L* W/O CM
4 of 5 series · 13 of 40 positions shown · non-contrast
Comparison: None.

CLINICAL DATA: Posterior ankle/heel pain for several months.

EXAM:
MRI OF THE LEFT ANKLE WITHOUT CONTRAST
TECHNIQUE: Multiplanar, multisequence MR imaging of the ankle was performed. No
intravenous contrast was administered.

[Series 3: T2 fat-sat · axial · left · 3.0mm · 0.25mm/px · z∈[-111,-11]mm · 4 of 30 slices shown (1 of 2)]
[im 1/30]
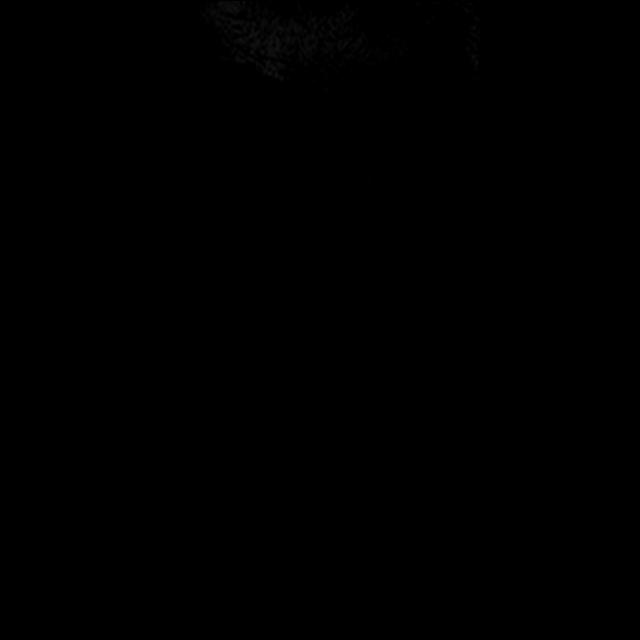
[im 4/30]
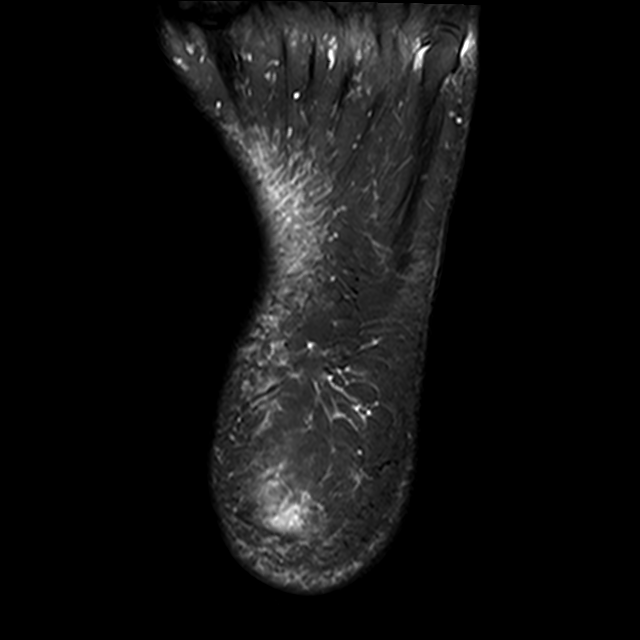
[im 15/30]
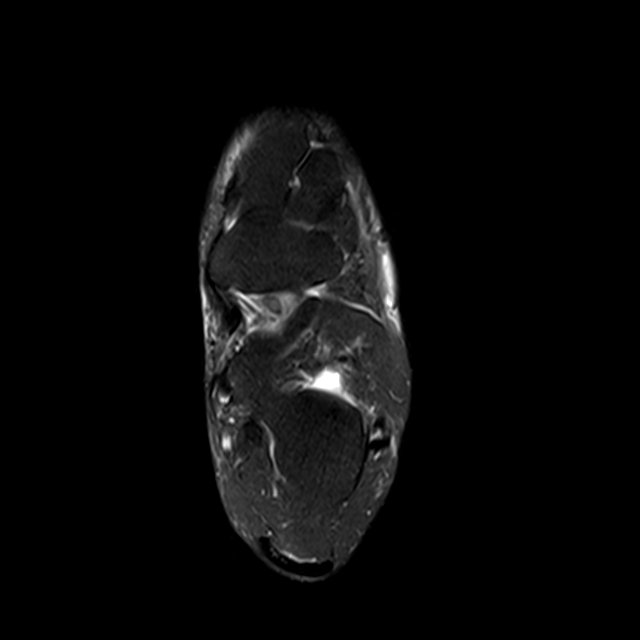
[im 26/30]
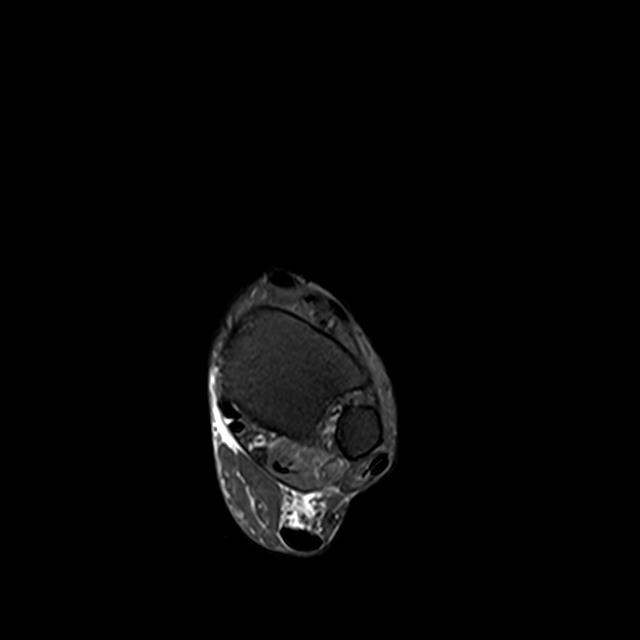

[Series 4: T1 · sagittal · left · 4.0mm · 0.27mm/px · 3 of 22 slices shown (1 of 2)]
[im 5/22]
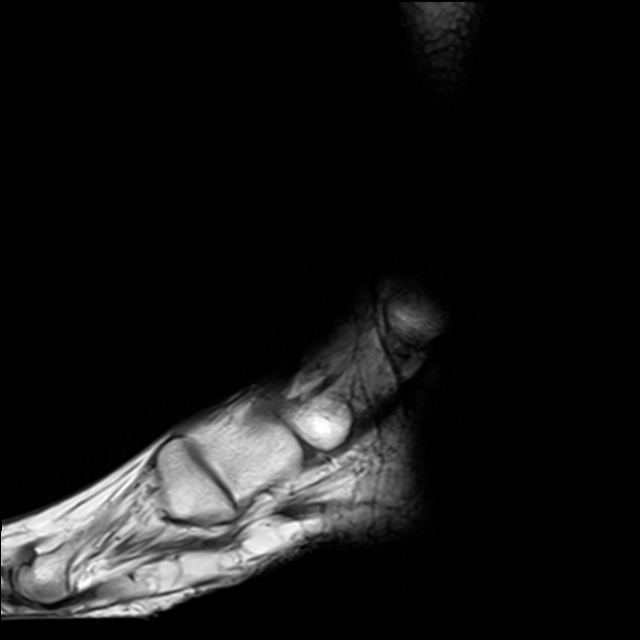
[im 13/22]
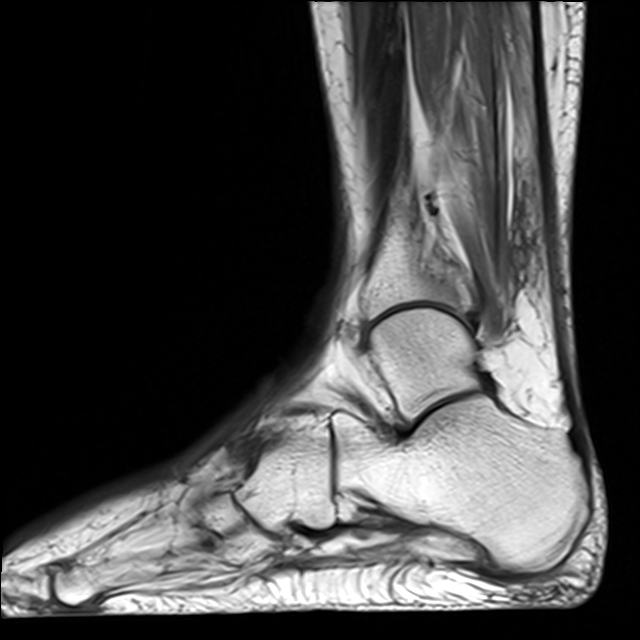
[im 22/22]
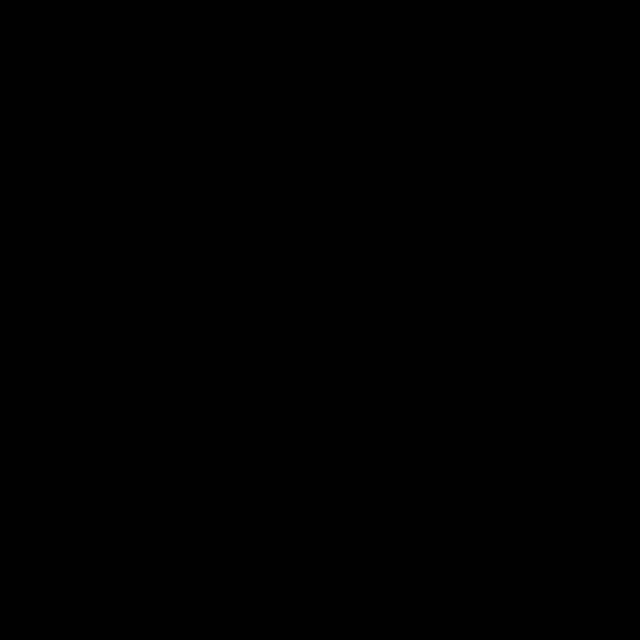

[Series 6: T2 fat-sat · coronal · left · 3.0mm · 0.25mm/px · 3 of 33 slices shown (2 of 2)]
[im 4/33]
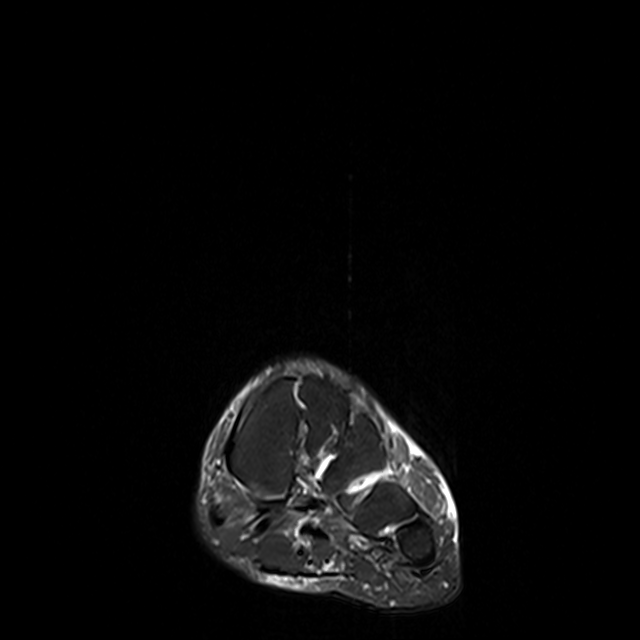
[im 18/33]
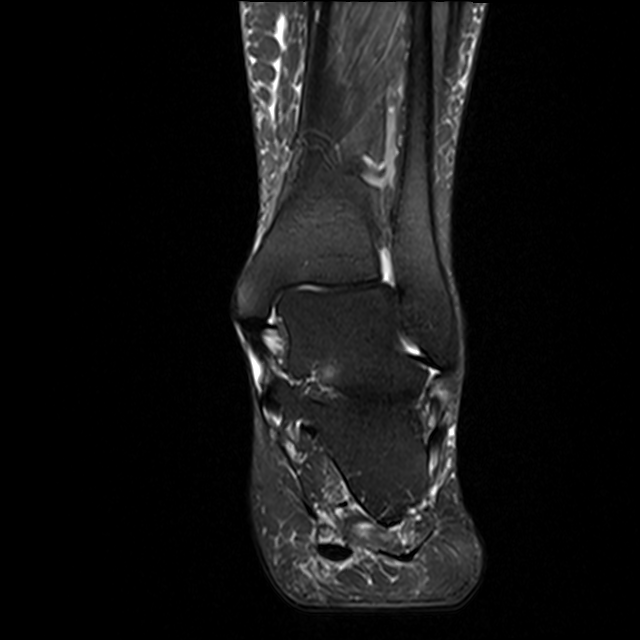
[im 29/33]
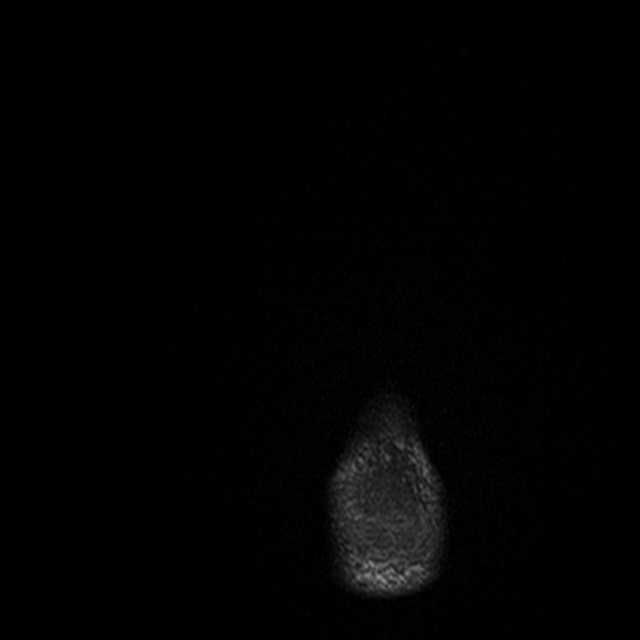

[Series 7: T1 · axial · left · 3.0mm · 0.25mm/px · z∈[-99,-11]mm · 3 of 30 slices shown (2 of 2)]
[im 4/30]
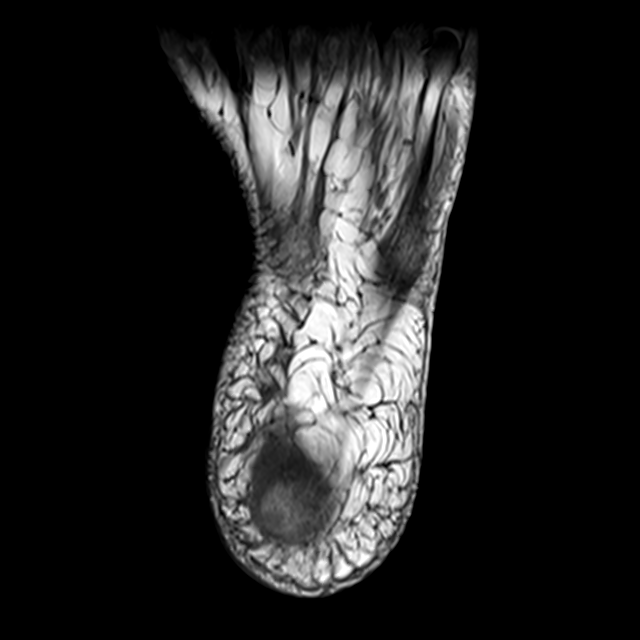
[im 15/30]
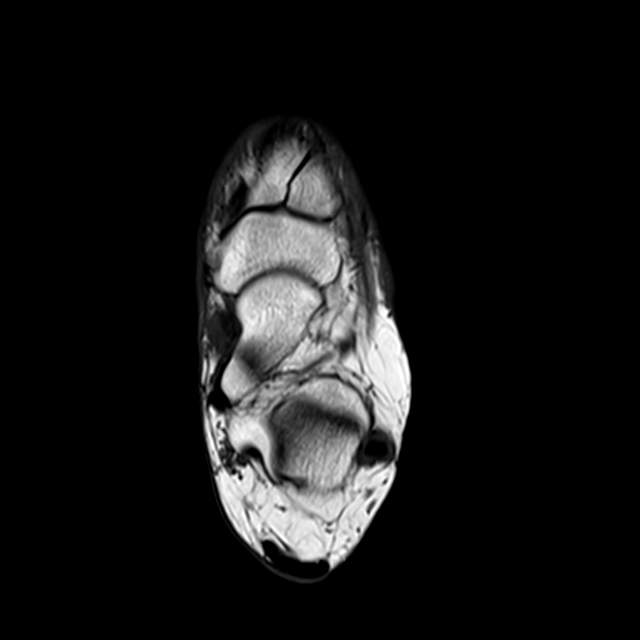
[im 26/30]
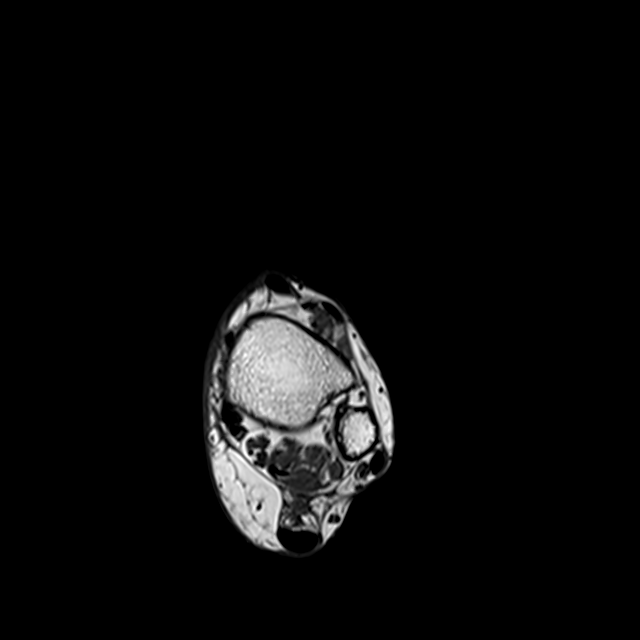

[13 of 40 positions shown; findings below may reference images not displayed]

FINDINGS: TENDONS

Peroneal: Intact

Posteromedial: Intact

Anterior: Intact

Achilles: Normal

Plantar Fascia: Significant/severe changes of plantar fasciitis.
There is thickening and partial-thickness interstitial tearing of
the central slip. No complete tear/rupture is identified. There is
associated fairly marked reactive marrow edema in the calcaneus and
moderate diffuse edema in the short flexor muscles, most notably the
flexor digitorum brevis.

LIGAMENTS

Lateral: Intact

Medial: Intact

CARTILAGE

Ankle Joint: Mild degenerative chondrosis. No full-thickness
chondral defect or osteochondral lesion. Small joint effusion.

Subtalar Joints/Sinus Tarsi: Mild degenerative chondrosis. No joint
effusions. The sinus tarsi is unremarkable. The cervical and
interosseous ligaments are intact. The spring ligament is intact.

Bones: Other than the reactive marrow it in the calcaneus at the
plantar fascia attachment no other significant findings. No stress
fracture, AVN or osteochondral abnormality.

Other: No significant soft tissue lesions.
IMPRESSION: 1. Significant/severe changes of plantar fasciitis with thickening
and partial-thickness interstitial tearing of the central slip. No
complete tear/rupture. Associated fairly marked reactive marrow
edema in the calcaneus and moderate diffuse edema in the short
flexor muscles, most notably the flexor digitorum brevis.
2. Intact medial and lateral ankle ligaments and tendons.
3. Mild tibiotalar and subtalar joint degenerative changes.

## 2020-06-21 ENCOUNTER — Other Ambulatory Visit: Payer: Medicare Other

## 2020-06-22 ENCOUNTER — Other Ambulatory Visit: Payer: Self-pay

## 2020-06-22 ENCOUNTER — Ambulatory Visit (INDEPENDENT_AMBULATORY_CARE_PROVIDER_SITE_OTHER): Payer: Medicare Other | Admitting: Podiatry

## 2020-06-22 ENCOUNTER — Encounter: Payer: Medicare Other | Admitting: Family Medicine

## 2020-06-22 DIAGNOSIS — M722 Plantar fascial fibromatosis: Secondary | ICD-10-CM

## 2020-06-22 NOTE — Progress Notes (Signed)
She presents today for follow-up of her plantar fasciitis of her left foot.  Objective: Pathology results/MRI result demonstrates a partial tear of the central band of the plantar fascia.  Assessment: Plantar fasciitis with central tear.  Plan: Discussed etiology pathology conservative surgical therapies at this point in time highly recommended a total endoscopic plantar fasciotomy we discussed pros and cons of the surgery as well as the surgery center and anesthesia.  She will follow up with me in the near future for surgical consult she would like to talk to her family about this and make a decision as to where to try to tolerate the pain that she is experiencing or have the surgery.  We will probably need to do another D-dimer to make sure her clot risk is low.

## 2020-06-23 ENCOUNTER — Telehealth: Payer: Self-pay

## 2020-06-23 DIAGNOSIS — S93692D Other sprain of left foot, subsequent encounter: Secondary | ICD-10-CM

## 2020-06-23 NOTE — Telephone Encounter (Signed)
Pt would like PT orders since she wants to wait on having surgery until after the holidays.

## 2020-06-27 DIAGNOSIS — N200 Calculus of kidney: Secondary | ICD-10-CM | POA: Diagnosis not present

## 2020-06-27 NOTE — Telephone Encounter (Signed)
Orders placed with [facility] in house PT- they will contact patient to schedule

## 2020-06-27 NOTE — Telephone Encounter (Signed)
Caryl Pina please send her to pt for plantar fascial tear.

## 2020-07-03 DIAGNOSIS — M6702 Short Achilles tendon (acquired), left ankle: Secondary | ICD-10-CM

## 2020-07-03 DIAGNOSIS — M722 Plantar fascial fibromatosis: Secondary | ICD-10-CM | POA: Insufficient documentation

## 2020-07-03 DIAGNOSIS — M629 Disorder of muscle, unspecified: Secondary | ICD-10-CM | POA: Diagnosis not present

## 2020-07-03 HISTORY — DX: Plantar fascial fibromatosis: M72.2

## 2020-07-03 HISTORY — DX: Short Achilles tendon (acquired), left ankle: M67.02

## 2020-07-04 ENCOUNTER — Encounter: Payer: Medicare Other | Admitting: Podiatry

## 2020-07-17 DIAGNOSIS — M79672 Pain in left foot: Secondary | ICD-10-CM | POA: Diagnosis not present

## 2020-07-17 DIAGNOSIS — M79671 Pain in right foot: Secondary | ICD-10-CM | POA: Diagnosis not present

## 2020-07-17 DIAGNOSIS — M722 Plantar fascial fibromatosis: Secondary | ICD-10-CM | POA: Diagnosis not present

## 2020-07-17 DIAGNOSIS — M792 Neuralgia and neuritis, unspecified: Secondary | ICD-10-CM | POA: Diagnosis not present

## 2020-07-20 ENCOUNTER — Ambulatory Visit (INDEPENDENT_AMBULATORY_CARE_PROVIDER_SITE_OTHER): Payer: Medicare Other | Admitting: Podiatry

## 2020-07-20 ENCOUNTER — Other Ambulatory Visit: Payer: Self-pay

## 2020-07-20 DIAGNOSIS — L603 Nail dystrophy: Secondary | ICD-10-CM | POA: Diagnosis not present

## 2020-07-20 NOTE — Progress Notes (Signed)
She presents today for follow-up of her laser therapy.  States that it was doing very well and then the margins have started getting dark again.  Objective: Vital signs are stable alert oriented x3 margins are dark along the hallux left.  The nail is long.  Assessment: Onychomycosis.  Plan: I would continue laser therapy until completely resolved.  Follow-up with Morrie Sheldon for that.

## 2020-07-21 ENCOUNTER — Ambulatory Visit: Payer: Medicare Other

## 2020-07-26 DIAGNOSIS — M722 Plantar fascial fibromatosis: Secondary | ICD-10-CM | POA: Diagnosis not present

## 2020-07-27 ENCOUNTER — Ambulatory Visit (INDEPENDENT_AMBULATORY_CARE_PROVIDER_SITE_OTHER): Payer: Medicare Other | Admitting: *Deleted

## 2020-07-27 ENCOUNTER — Other Ambulatory Visit: Payer: Self-pay

## 2020-07-27 DIAGNOSIS — L603 Nail dystrophy: Secondary | ICD-10-CM

## 2020-07-27 DIAGNOSIS — B351 Tinea unguium: Secondary | ICD-10-CM

## 2020-07-27 NOTE — Progress Notes (Signed)
Patient presents today for the 1st of maintenance laser treatment.   Diagnosed with mycotic nail infection by Dr. Milinda Pointer.  Toenail most affected hallux left. The borders are mainly what is discolored, lateral over medial.   Dr. Milinda Pointer said it may stay this way along the borders, but will try laser maintenance over the next several months and see how it does.  All other systems are negative.  Nail was filed thin. Laser therapy was administered to 1st toenails left and patient tolerated the treatment well. All safety precautions were in place.    Follow up in 2 months for laser maintenance of the left hallux.

## 2020-08-02 ENCOUNTER — Ambulatory Visit: Payer: Medicare Other | Admitting: Internal Medicine

## 2020-08-04 ENCOUNTER — Encounter: Payer: Self-pay | Admitting: Nurse Practitioner

## 2020-08-07 ENCOUNTER — Ambulatory Visit (INDEPENDENT_AMBULATORY_CARE_PROVIDER_SITE_OTHER): Payer: Medicare Other | Admitting: Nurse Practitioner

## 2020-08-07 ENCOUNTER — Encounter: Payer: Self-pay | Admitting: Nurse Practitioner

## 2020-08-07 ENCOUNTER — Other Ambulatory Visit: Payer: Self-pay

## 2020-08-07 VITALS — BP 138/83 | HR 61 | Wt 125.6 lb

## 2020-08-07 DIAGNOSIS — M722 Plantar fascial fibromatosis: Secondary | ICD-10-CM | POA: Diagnosis not present

## 2020-08-07 DIAGNOSIS — G6289 Other specified polyneuropathies: Secondary | ICD-10-CM | POA: Diagnosis not present

## 2020-08-07 MED ORDER — GABAPENTIN 100 MG PO CAPS: ORAL_CAPSULE | ORAL | 3 refills | Status: DC

## 2020-08-07 NOTE — Progress Notes (Signed)
Pt has foot pain (both) wants advice and to talk about it.

## 2020-08-07 NOTE — Progress Notes (Signed)
Established Patient Office Visit  Subjective:  Patient ID: Cheyenne Sanders, female    DOB: 02/02/1951  Age: 70 y.o. MRN: [id number]  CC:  Chief Complaint  Patient presents with  . Foot Pain    HPI Cheyenne Sanders presents for follow-up for bilateral plantar fasciitis. She has been seen by podiatry starting in November 2021. He left foot is worse than the right. She started treatment with exercises and a night splint for conservative treatment and later had an MRI of the left foot when treatment was not effective, which revealed a partial tear of the central band of the left fascia. Podiatry high recommended a total endoscopic plantar fasciotomy at that time, but the patient was hesitant to proceed with surgery and sought second opinion. Second opinion podiatry recommended CAM boot with ambulation, exercises, and icing of the feet and formal physical therapy to avoid surgery. He also mentioned cortisone injections as a possible future option. A chart review reveals she did have consultation with [facility] on 07/17/2020 who recommended against surgery and ordered physical therapy and conservative treatment options.  Today she is here to discuss my recommendations. She is confused by the three varied answers and is not sure what she should be doing for treatment.   She tells me that she doesn't feel the symptoms are worsening, but not significantly improving either. She is wearing a small brace on her foot daily that she does feel helps somewhat.  She endorses a burning sensation in the feet bilaterally, numbness on the great toe of the left foot, and purple discoloration of the toes on the left foot on occasion. Of note she did have chemotherapy in the past.   Past Medical History:  Diagnosis Date  . Breast cancer ([place]) [id number]   R breast cancer x 2; lumpectomy with radiation, chemotherapy; mastectomy R.  . Colon polyps   . Encounter for medical examination to  establish care 05/29/2020  . Flank pain 09/06/2016  . Hyperlipidemia   . Hypertension   . Nephrolithiasis   . Personal history of chemotherapy   . Personal history of radiation therapy   . Pneumonia   . Skin infection 11/25/2017  . Tick bite 11/25/2017    Past Surgical History:  Procedure Laterality Date  . ABDOMINAL HYSTERECTOMY    . BREAST BIOPSY Left 2016  . BREAST LUMPECTOMY Right 1999  . MASTECTOMY Right    2014  . PARTIAL HYSTERECTOMY  1986   DUB; ovaries intact.    Family History  Problem Relation Age of Onset  . Heart disease Mother   . Colon cancer Maternal Grandfather   . Heart attack Maternal Grandfather        or ? stroke  . Colon cancer Maternal Aunt   . Thyroid disease Son   . Breast cancer Neg Hx     Social History   Socioeconomic History  . Marital status: Widowed    Spouse name: Not on file  . Number of children: 2  . Years of education: Not on file  . Highest education level: Not on file  Occupational History  . Occupation: retired  Tobacco Use  . Smoking status: Never Smoker  . Smokeless tobacco: Never Used  Vaping Use  . Vaping Use: Never used  Substance and Sexual Activity  . Alcohol use: Yes    Comment: rarely  . Drug use: Never  . Sexual activity: Not Currently    Partners: Male  Other Topics Concern  .  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      Tobacco: none      Alcohol: rare; social      Exercise: no formal exercise.        ADLs: independent with ADLs   Social Determinants of Health   Financial Resource Strain: Not on file  Food Insecurity: Not on file  Transportation Needs: Not on file  Physical Activity: Not on file  Stress: Not on file  Social Connections: Not on file  Intimate  Partner Violence: Not on file    Outpatient Medications Prior to Visit  Medication Sig Dispense Refill  . Metoprolol Succinate 25 MG CS24 Take 25 mg by mouth daily. 90 capsule 1  . tamsulosin (FLOMAX) 0.4 MG CAPS capsule Take 0.4 mg by mouth daily.    . ondansetron (ZOFRAN) 4 MG tablet Take 1 tablet (4 mg total) by mouth every 6 (six) hours as needed for nausea. 20 tablet 0   No facility-administered medications prior to visit.    Allergies  Allergen Reactions  . Sublimaze [Fentanyl] Shortness Of Breath    ROS Review of Systems All review of systems negative except what is listed in the HPI    Objective:    Physical Exam Vitals and nursing note reviewed.  Constitutional:      Appearance: Normal appearance. She is normal weight.  Musculoskeletal:     Right lower leg: No edema.     Left lower leg: No edema.     Left foot: Decreased range of motion.       Feet:  Feet:     Right foot:     Skin integrity: Skin integrity normal.     Left foot:     Skin integrity: Skin integrity normal.     Toenail Condition: Fungal disease present. Skin:    General: Skin is warm and dry.     Capillary Refill: Capillary refill takes less than 2 seconds.  Neurological:     General: No focal deficit present.     Mental Status: She is alert and oriented to person, place, and time.  Psychiatric:        Mood and Affect: Mood normal.        Behavior: Behavior normal.        Thought Content: Thought content normal.        Judgment: Judgment normal.     BP 138/83   Pulse 61   Wt 125 lb 9.6 oz (57 kg)   SpO2 100%   BMI 21.23 kg/m  Wt Readings from Last 3 Encounters:  08/07/20 125 lb 9.6 oz (57 kg)  05/29/20 126 lb 6.4 oz (57.3 kg)  05/12/20 132 lb (59.9 kg)     There are no preventive care reminders to display for this patient.  There are no preventive care reminders to display for this patient.  Lab Results  Component Value Date   TSH 2.470 02/25/2019   Lab Results   Component Value Date   WBC 15.6 (H) 05/10/2020   HGB 11.0 (L) 05/10/2020   HCT 33.4 (L) 05/10/2020   MCV 95.2 05/10/2020   PLT 144 (L) 05/10/2020   Lab Results  Component Value  Date   NA 140 05/10/2020   K 4.1 05/10/2020   CO2 18 (L) 05/10/2020   GLUCOSE 105 (H) 05/10/2020   BUN 19 05/10/2020   CREATININE 0.67 05/10/2020   BILITOT 1.1 05/09/2020   ALKPHOS 69 05/09/2020   AST 33 05/09/2020   ALT 36 05/09/2020   PROT 5.1 (L) 05/09/2020   ALBUMIN 2.7 (L) 05/09/2020   CALCIUM 8.1 (L) 05/10/2020   ANIONGAP 9 05/10/2020   Lab Results  Component Value Date   CHOL 247 (H) 02/25/2019   Lab Results  Component Value Date   HDL 62 02/25/2019   Lab Results  Component Value Date   LDLCALC 166 (H) 02/25/2019   Lab Results  Component Value Date   TRIG 95 02/25/2019   Lab Results  Component Value Date   CHOLHDL 4.0 02/25/2019   Lab Results  Component Value Date   HGBA1C 5.5 06/17/2017      Assessment & Plan:   1. Other polyneuropathy Symptoms consistent with peripheral neuropathy to the feet bilaterally, most likely related to history of chemotherapy. We will trial gabapentin for this to see if there is improvement of symptoms. Recommend starting at 100mg  up to three times and day and increasing up to 300mg  three times a day as needed for neuropathic pain and burning sensation in the feet. - gabapentin (NEURONTIN) 100 MG capsule; Take 1 to 3 tabs (100mg  to 300mg ) by mouth up to three times a day for nerve pain. Start with 100mg  and increase only  if needed.  Dispense: 120 capsule; Refill: 3  2. Plantar fasciitis Continue CAM boot when on feet for more than 10-15 minutes at a time- continue to wear foot splint at night for proper stretch. Continue with formal physical therapy and therapy sessions on own at home at least twice every day.  Discussed that this type of injury can take time to heal. Discussed that surgical option is not recommended unless conservative treatments  have failed for at least 6 months- even at that point not shown to be significantly effective in many cases.  Follow-up with Dr. Wynonia Lawman with podiatry and ortho as needed. I will be happy to manage physical therapy, if needed.    Return if symptoms worsen or fail to improve.  Orma Render, NP

## 2020-08-07 NOTE — Patient Instructions (Addendum)
Wear the Walking boot for 4 weeks while up walking around for more than 10-15 minutes at a time. Wear the brace to bed. Attend physical therapy as directed from ortho doctor. Keep doing the icing and exercises.   We will start the gabapentin to see if this is helpful for the neuropathy symptoms.     Plantar Fasciitis  Plantar fasciitis is a painful foot condition that affects the heel. It occurs when the band of tissue that connects the toes to the heel bone (plantar fascia) becomes irritated. This can happen as the result of exercising too much or doing other repetitive activities (overuse injury). Plantar fasciitis can cause mild irritation to severe pain that makes it difficult to walk or move. The pain is usually worse in the morning after sleeping, or after sitting or lying down for a period of time. Pain may also be worse after long periods of walking or standing. What are the causes? This condition may be caused by:  Standing for long periods of time.  Wearing shoes that do not have good arch support.  Doing activities that put stress on joints (high-impact activities). This includes ballet and exercise that makes your heart beat faster (aerobic exercise), such as running.  Being overweight.  An abnormal way of walking (gait).  Tight muscles in the back of your lower leg (calf).  High arches in your feet or flat feet.  Starting a new athletic activity. What are the signs or symptoms? The main symptom of this condition is heel pain. Pain may get worse after the following:  Taking the first steps after a time of rest, especially in the morning after awakening, or after you have been sitting or lying down for a while.  Long periods of standing still. Pain may decrease after 30-45 minutes of activity, such as gentle walking. How is this diagnosed? This condition may be diagnosed based on your medical history, a physical exam, and your symptoms. Your health care provider will  check for:  A tender area on the bottom of your foot.  A high arch in your foot or flat feet.  Pain when you move your foot.  Difficulty moving your foot. You may have imaging tests to confirm the diagnosis, such as:  X-rays.  Ultrasound.  MRI. How is this treated? Treatment for plantar fasciitis depends on how severe your condition is. Treatment may include:  Rest, ice, pressure (compression), and raising (elevating) the affected foot. This is called RICE therapy. Your health care provider may recommend RICE therapy along with over-the-counter pain medicines to manage your pain.  Exercises to stretch your calves and your plantar fascia.  A splint that holds your foot in a stretched, upward position while you sleep (night splint).  Physical therapy to relieve symptoms and prevent problems in the future.  Injections of steroid medicine (cortisone) to relieve pain and inflammation.  Stimulating your plantar fascia with electrical impulses (extracorporeal shock wave therapy). This is usually the last treatment option before surgery.  Surgery, if other treatments have not worked after 12 months. Follow these instructions at home: Managing pain, stiffness, and swelling  If directed, put ice on the painful area. To do this: ? Put ice in a plastic bag, or use a frozen bottle of water. ? Place a towel between your skin and the bag or bottle. ? Roll the bottom of your foot over the bag or bottle. ? Do this for 20 minutes, 2-3 times a day.  Wear athletic shoes  that have air-sole or gel-sole cushions, or try soft shoe inserts that are designed for plantar fasciitis.  Elevate your foot above the level of your heart while you are sitting or lying down.   Activity  Avoid activities that cause pain. Ask your health care provider what activities are safe for you.  Do physical therapy exercises and stretches as told by your health care provider.  Try activities and forms of exercise  that are easier on your joints (low impact). Examples include swimming, water aerobics, and biking. General instructions  Take over-the-counter and prescription medicines only as told by your health care provider.  Wear a night splint while sleeping, if told by your health care provider. Loosen the splint if your toes tingle, become numb, or turn cold and blue.  Maintain a healthy weight, or work with your health care provider to lose weight as needed.  Keep all follow-up visits. This is important. Contact a health care provider if you have:  Symptoms that do not go away with home treatment.  Pain that gets worse.  Pain that affects your ability to move or do daily activities. Summary  Plantar fasciitis is a painful foot condition that affects the heel. It occurs when the band of tissue that connects the toes to the heel bone (plantar fascia) becomes irritated.  Heel pain is the main symptom of this condition. It may get worse after exercising too much or standing still for a long time.  Treatment varies, but it usually starts with rest, ice, pressure (compression), and raising (elevating) the affected foot. This is called RICE therapy. Over-the-counter medicines can also be used to manage pain. This information is not intended to replace advice given to you by your health care provider. Make sure you discuss any questions you have with your health care provider. Document Revised: 10/18/2019 Document Reviewed: 10/18/2019 Elsevier Patient Education  2021 [place].   Plantar Fasciitis Rehab Ask your health care provider which exercises are safe for you. Do exercises exactly as told by your health care provider and adjust them as directed. It is normal to feel mild stretching, pulling, tightness, or discomfort as you do these exercises. Stop right away if you feel sudden pain or your pain gets worse. Do not begin these exercises until told by your health care provider.  Stretching  and range-of-motion exercises  Hold each stretch for 5-10 seconds and repeat 2-4 times. Recommend performing at least twice per day.   These exercises warm up your muscles and joints and improve the movement and flexibility of your foot. These exercises also help to relieve pain. Plantar fascia stretch 1. Sit with your left / right leg crossed over your opposite knee. 2. Hold your heel with one hand with that thumb near your arch. With your other hand, hold your toes and gently pull them back toward the top of your foot. You should feel a stretch on the base (bottom) of your toes, or the bottom of your foot (plantar fascia), or both. 3. Hold this stretch for__________ seconds. 4. Slowly release your toes and return to the starting position. Repeat __________ times. Complete this exercise __________ times a day.   Gastrocnemius stretch, standing This exercise is also called a calf (gastroc) stretch. It stretches the muscles in the back of the upper calf. 1. Stand with your hands against a wall. 2. Extend your left / right leg behind you, and bend your front knee slightly. 3. Keeping your heels on the floor, your  toes facing forward, and your back knee straight, shift your weight toward the wall. Do not arch your back. You should feel a gentle stretch in your upper calf. 4. Hold this position for __________ seconds. Repeat __________ times. Complete this exercise __________ times a day.   Soleus stretch, standing This exercise is also called a calf (soleus) stretch. It stretches the muscles in the back of the lower calf. 1. Stand with your hands against a wall. 2. Extend your left / right leg behind you, and bend your front knee slightly. 3. Keeping your heels on the floor and your toes facing forward, bend your back knee and shift your weight slightly over your back leg. You should feel a gentle stretch deep in your lower calf. 4. Hold this position for __________ seconds. Repeat __________  times. Complete this exercise __________ times a day. Gastroc and soleus stretch, standing step This exercise stretches the muscles in the back of the lower leg. These muscles are in the upper calf (gastrocnemius) and the lower calf (soleus). 1. Stand with the ball of your left / right foot on the front of a step. The ball of your foot is on the walking surface, right under your toes. 2. Keep your other foot firmly on the same step. 3. Hold on to the wall or a railing for balance. 4. Slowly lift your other foot, allowing your body weight to press your heel down over the edge of the front of the step. Keep knee straight and unbent. You should feel a stretch in your calf. 5. Hold this position for __________ seconds. 6. Return both feet to the step. 7. Repeat this exercise with a slight bend in your left / right knee. Repeat __________ times with your left / right knee straight and __________ times with your left / right knee bent. Complete this exercise __________ times a day. Balance exercise This exercise builds your balance and strength control of your arch to help take pressure off your plantar fascia. Single leg stand If this exercise is too easy, you can try it with your eyes closed or while standing on a pillow. 1. Without shoes, stand near a railing or in a doorway. You may hold on to the railing or door frame as needed. 2. Stand on your left / right foot. Keep your big toe down on the floor and lift the arch of your foot. You should feel a stretch across the bottom of your foot and your arch. Do not let your foot roll inward. 3. Hold this position for __________ seconds. Repeat __________ times. Complete this exercise __________ times a day. This information is not intended to replace advice given to you by your health care provider. Make sure you discuss any questions you have with your health care provider. Document Revised: 04/13/2020 Document Reviewed: 04/13/2020 Elsevier Patient  Education  [place].

## 2020-08-11 DIAGNOSIS — M722 Plantar fascial fibromatosis: Secondary | ICD-10-CM | POA: Diagnosis not present

## 2020-08-14 DIAGNOSIS — M6702 Short Achilles tendon (acquired), left ankle: Secondary | ICD-10-CM | POA: Diagnosis not present

## 2020-08-14 DIAGNOSIS — M629 Disorder of muscle, unspecified: Secondary | ICD-10-CM | POA: Diagnosis not present

## 2020-08-18 ENCOUNTER — Other Ambulatory Visit: Payer: Medicare Other

## 2020-08-30 DIAGNOSIS — M629 Disorder of muscle, unspecified: Secondary | ICD-10-CM | POA: Diagnosis not present

## 2020-08-30 DIAGNOSIS — G609 Hereditary and idiopathic neuropathy, unspecified: Secondary | ICD-10-CM | POA: Diagnosis not present

## 2020-08-30 DIAGNOSIS — M6702 Short Achilles tendon (acquired), left ankle: Secondary | ICD-10-CM | POA: Diagnosis not present

## 2020-08-30 DIAGNOSIS — M722 Plantar fascial fibromatosis: Secondary | ICD-10-CM | POA: Diagnosis not present

## 2020-09-01 DIAGNOSIS — N201 Calculus of ureter: Secondary | ICD-10-CM | POA: Diagnosis not present

## 2020-09-01 DIAGNOSIS — R1084 Generalized abdominal pain: Secondary | ICD-10-CM | POA: Diagnosis not present

## 2020-09-02 DIAGNOSIS — M629 Disorder of muscle, unspecified: Secondary | ICD-10-CM

## 2020-09-02 DIAGNOSIS — G609 Hereditary and idiopathic neuropathy, unspecified: Secondary | ICD-10-CM | POA: Insufficient documentation

## 2020-09-02 HISTORY — DX: Disorder of muscle, unspecified: M62.9

## 2020-09-22 ENCOUNTER — Encounter: Payer: Self-pay | Admitting: Gastroenterology

## 2020-09-29 ENCOUNTER — Other Ambulatory Visit: Payer: Medicare Other

## 2020-10-09 ENCOUNTER — Other Ambulatory Visit (HOSPITAL_BASED_OUTPATIENT_CLINIC_OR_DEPARTMENT_OTHER): Payer: Self-pay

## 2020-10-09 MED ORDER — METOPROLOL SUCCINATE 25 MG PO CS24: 25.0000 mg | EXTENDED_RELEASE_CAPSULE | Freq: Every day | ORAL | 0 refills | Status: DC

## 2020-10-09 NOTE — Telephone Encounter (Signed)
Pt called in for medication refill for metoprolol Pt last seen at [facility] by Laretta Bolster in Jan 2022 Pt voiced she would like to see Sarabeth at [facility] location -- Appt scheduled

## 2020-10-10 ENCOUNTER — Encounter (HOSPITAL_BASED_OUTPATIENT_CLINIC_OR_DEPARTMENT_OTHER): Payer: Self-pay

## 2020-10-17 ENCOUNTER — Ambulatory Visit (HOSPITAL_BASED_OUTPATIENT_CLINIC_OR_DEPARTMENT_OTHER): Payer: Medicare Other | Admitting: Nurse Practitioner

## 2020-10-26 ENCOUNTER — Ambulatory Visit (HOSPITAL_BASED_OUTPATIENT_CLINIC_OR_DEPARTMENT_OTHER): Payer: Medicare Other | Admitting: Nurse Practitioner

## 2020-11-02 ENCOUNTER — Ambulatory Visit: Payer: Medicare Other | Admitting: Gastroenterology

## 2020-11-07 ENCOUNTER — Other Ambulatory Visit: Payer: Self-pay

## 2020-11-07 ENCOUNTER — Ambulatory Visit (INDEPENDENT_AMBULATORY_CARE_PROVIDER_SITE_OTHER): Payer: Medicare Other | Admitting: Nurse Practitioner

## 2020-11-07 ENCOUNTER — Encounter (HOSPITAL_BASED_OUTPATIENT_CLINIC_OR_DEPARTMENT_OTHER): Payer: Self-pay | Admitting: Nurse Practitioner

## 2020-11-07 VITALS — BP 126/78 | HR 72 | Resp 14 | Ht 64.0 in | Wt 129.6 lb

## 2020-11-07 DIAGNOSIS — R5383 Other fatigue: Secondary | ICD-10-CM

## 2020-11-07 DIAGNOSIS — R202 Paresthesia of skin: Secondary | ICD-10-CM | POA: Diagnosis not present

## 2020-11-07 DIAGNOSIS — R300 Dysuria: Secondary | ICD-10-CM

## 2020-11-07 DIAGNOSIS — I10 Essential (primary) hypertension: Secondary | ICD-10-CM | POA: Diagnosis not present

## 2020-11-07 DIAGNOSIS — M859 Disorder of bone density and structure, unspecified: Secondary | ICD-10-CM

## 2020-11-07 DIAGNOSIS — R635 Abnormal weight gain: Secondary | ICD-10-CM | POA: Diagnosis not present

## 2020-11-07 DIAGNOSIS — M8580 Other specified disorders of bone density and structure, unspecified site: Secondary | ICD-10-CM

## 2020-11-07 DIAGNOSIS — Z7689 Persons encountering health services in other specified circumstances: Secondary | ICD-10-CM

## 2020-11-07 DIAGNOSIS — R632 Polyphagia: Secondary | ICD-10-CM

## 2020-11-07 HISTORY — DX: Abnormal weight gain: R63.5

## 2020-11-07 HISTORY — DX: Other fatigue: R53.83

## 2020-11-07 HISTORY — DX: Polyphagia: R63.2

## 2020-11-07 HISTORY — DX: Dysuria: R30.0

## 2020-11-07 HISTORY — DX: Disorder of bone density and structure, unspecified: M85.9

## 2020-11-07 HISTORY — DX: Paresthesia of skin: R20.2

## 2020-11-07 LAB — POCT URINALYSIS DIP (CLINITEK)
Bilirubin, UA: NEGATIVE
Blood, UA: NEGATIVE
Glucose, UA: NEGATIVE mg/dL
Ketones, POC UA: NEGATIVE mg/dL
Leukocytes, UA: NEGATIVE
Nitrite, UA: NEGATIVE
POC PROTEIN,UA: NEGATIVE
Spec Grav, UA: 1.025 (ref 1.010–1.025)
Urobilinogen, UA: 0.2 E.U./dL
pH, UA: 5.5 (ref 5.0–8.0)

## 2020-11-07 NOTE — Assessment & Plan Note (Signed)
Blood pressure 126/78 today.  Will obtain labs today.  No changes to medications- she is tolerating well and blood pressures within normal range.  Plan to follow-up in 6 months or sooner if needed.  Labs in 6 months.

## 2020-11-07 NOTE — Progress Notes (Signed)
New Patient Office Visit  Subjective:  Patient ID: Cheyenne Sanders, female    DOB: 1950/09/03  Age: 70 y.o. MRN: [id number]  CC:  Chief Complaint  Patient presents with  . Establish Care  . Hypertension  . Neurologic Problem    HPI Cheyenne Sanders  Is a pleasant 70 year old patient to known to me, transferring care from [facility] to this location.  She is also seen by Cheyenne Sanders with podiatry, Cheyenne Sanders for urology, Cheyenne Sanders for GI, and has an upcoming appointment with Cheyenne Sanders for neurology.  Concerns today: Fatigue Increased Appetite Weight Gain Urinary Hesitancy BP Neuropathy  Cheyenne Sanders endorses increased symptoms of fatigue over the past 4-6 weeks for unknown reasons. She reports that she sleeps well at night and seems to wake up rested, but by 1-2 pm she feels extremely exhausted and does not have the energy to do anything else for the rest of the day. She does tell me that she was diagnosed with COVID in [place] March of this year and that is when the fatigue first started. She reports that fatigue was her worst symptom and otherwise she only had a low grade fever and sinus-like symptoms. She is unsure if the fatigue is related to COVID or something else, with other symptoms she has present. She denies weakness, hair loss or thinning hair, palpitations, constipation, abdominal pain, nausea, or vomiting.   She endorses increased appetite over the past year with about a 10 pound weight gain. She tells me that she can eat a full meal and just an hour or so later she could eat another meal again. She reports that she never really feels full or completely satisfied. This is different than in the past and she doesn't know of anything that has significantly changed to have caused this.  She also endorses decreased physical activity since she had the tear in her plantar fascia and she is unsure if her weight gain is due to this or her increased  appetite. She denies increased thirst or increased urination. She does not have a family history of diabetes.   She tells me that earlier this week she experienced urinary hesitancy and would like to have her urine tested today to make sure she does not have a UTI. She denies fever, chills, abdominal pain, dysuria, nocturia, or low back pain.   She reports that her blood pressure has been well controlled lately. She has been taking her medication as prescribed without missed doses. She is due for labs today. She denies shortness of breath, chest pain, palpitations, vision changes, headaches, or dizziness.   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 She is concerned that the plantar fascia has not healed and the tear is the issue that is causing the problems. She endorses burning pain in the right foot with intermittent numbness and tingling. She also reports intermittent shooting pains. Nothing she has taken or tried makes this better. She has been prescribed gabapentin, but she is unsure if she should start this. She reports that she has an appointment with neurology on Thursday, but has been trying to get this moved up so she can be seen sooner. She tells me that Cheyenne Sanders recommended a nerve study, but she heard that they put needles in your leg to test the nerves and this makes her uncomfortable- she is not sure she wants to have this done. She has seen three providers about her  foot including podiatry, orthopedic surgery, and orthopedics. She reports that she is very anxious about her foot and is not sure what to do. She denies swelling in the foot, color changes, coldness, or lack of feeling. She endorses pain with walking and an altered gait due to the pain.    Past Medical History:  Diagnosis Date  . Breast cancer ([place]) [id number]   R breast cancer x 2; lumpectomy with radiation, chemotherapy; mastectomy R.  . Colon polyps   . Coronary artery disease involving native  coronary artery of native heart without angina pectoris 06/28/2015   Overview:  underwent cardiac cath/PTCA in 1992 at age 70   12/1990-cardiac catheterization/PTCA 08/1991-cardiac catheterization 06/01/2003-treadmill dual-isotope stress test-normal perfusion, 15.8 METs 02/28/2004-coronary artery calcium score-1342.9 02/04/2005-treadmill dual-isotope stress test-normal perfusion, 13.5 METs 01/26/2007-stress echocardiogram-normal, 13 METs 04/27/2008-treadmill dual-isotop  . Encounter for medical examination to establish care 05/29/2020  . Flank pain 09/06/2016  . Hyperlipidemia   . Hypertension   . Nephrolithiasis   . Personal history of chemotherapy   . Personal history of radiation therapy   . Pneumonia   . Skin infection 11/25/2017  . Tick bite 11/25/2017    ROS Review of Systems All review of systems negative except what is listed in the HPI  Objective:   Today's Vitals: BP 126/78   Pulse 72   Resp 14   Ht 5\' 4"  (1.626 m)   Wt 129 lb 9.6 oz (58.8 kg)   SpO2 100%   BMI 22.25 kg/m   Physical Exam Vitals and nursing note reviewed.  Constitutional:      General: She is not in acute distress.    Appearance: Normal appearance. She is normal weight.  HENT:     Head: Normocephalic and atraumatic.  Eyes:     Extraocular Movements: Extraocular movements intact.     Conjunctiva/sclera: Conjunctivae normal.     Pupils: Pupils are equal, round, and reactive to light.  Neck:     Vascular: No carotid bruit.  Cardiovascular:     Rate and Rhythm: Normal rate and regular rhythm.     Pulses: Normal pulses.          Dorsalis pedis pulses are 2+ on the right side and 2+ on the left side.       Posterior tibial pulses are 2+ on the right side and 2+ on the left side.     Heart sounds: Normal heart sounds. No murmur heard.   Pulmonary:     Effort: Pulmonary effort is normal.     Breath sounds: Normal breath sounds.  Abdominal:     General: Abdomen is flat. Bowel sounds are normal. There is  no distension.     Palpations: Abdomen is soft.     Tenderness: There is no abdominal tenderness. There is no right CVA tenderness, left CVA tenderness, guarding or rebound.  Musculoskeletal:        General: Normal range of motion.     Cervical back: Normal range of motion and neck supple. No rigidity or tenderness.     Right lower leg: No edema.     Left lower leg: No edema.  Feet:     Left foot:     Skin integrity: Skin integrity normal.  Lymphadenopathy:     Cervical: No cervical adenopathy.  Skin:    General: Skin is warm and dry.     Capillary Refill: Capillary refill takes less than 2 seconds.  Neurological:     General: No focal deficit  present.     Mental Status: She is alert and oriented to person, place, and time.     Sensory: No sensory deficit.     Motor: No weakness.     Coordination: Coordination normal.     Gait: Gait abnormal.  Psychiatric:        Mood and Affect: Mood normal.        Behavior: Behavior normal.        Thought Content: Thought content normal.        Judgment: Judgment normal.     Assessment & Plan:   Problem List Items Addressed This Visit      Cardiovascular and Mediastinum   Primary hypertension    Blood pressure 126/78 today.  Will obtain labs today.  No changes to medications- she is tolerating well and blood pressures within normal range.  Plan to follow-up in 6 months or sooner if needed.  Labs in 6 months.       Relevant Orders   CBC w/Diff/Platelet   Comprehensive metabolic panel   Lipid panel   B12 and Folate Panel   Thyroid Panel With TSH   VITAMIN D 25 Hydroxy (Vit-D Deficiency, Fractures)     Musculoskeletal and Integument   Osteopenia    History of osteopenia detected on DEXA exam.  Will monitor vitamin D levels given the increased fatigue and pain she is experiencing. Descriptors consistent with nerve pain, but would like to rule out other etiologies at this time.  Labs ordered today.        Relevant Orders    VITAMIN D 25 Hydroxy (Vit-D Deficiency, Fractures)   Disorder of bone density and structure, unspecified     Osteopenia- will monitor vitamin D levels given recent increase in fatigue.  Labs ordered today.  Will make changes to the plan of care as necessary based on results.       Relevant Orders   VITAMIN D 25 Hydroxy (Vit-D Deficiency, Fractures)     Other   Encounter to establish care - Primary    Known patient to provider from previous practice to establish care with current practice today.  Review of current and past medical history, social history, family history, allergies, and SDOH today.  Patient is due for Medicare Wellness visit.  Patient is due for follow-up labs today.  Update Tdap status needed- will do at [facility]      Fatigue    Increased fatigue since COVID infection in March.  Discussed with patient that even with mild COVID symptoms, fatigue can be a complication that can last several months after infection. Although most likely COVID etiology given timing onset, will obtain labs today for further evaluation. Recommend rest when she is tired and not push too hard during the morning hours. Naps are OK as long as they are not interfering with night time sleep.  Will make changes to the plan of care as necessary once test results received.       Relevant Orders   CBC w/Diff/Platelet   Comprehensive metabolic panel   Lipid panel   B12 and Folate Panel   Thyroid Panel With TSH   VITAMIN D 25 Hydroxy (Vit-D Deficiency, Fractures)   Paresthesia of right foot    Symptoms consistent with neuropathy.  Discussed with patient the etiology of neuropathy and difference between plantar fascia pain. It is likely that the fascia has healed given the length of time since the tear and the treatments she has completed. It is possible that  the tear may have exacerbated the neuropathic pain- but not entirely clear.  Will run tests to rule out thyroid, B12, or vitamin D etiology.   Recommend to the patient that she trial the gabapentin at bedtime with lowest dose to see if this is helpful for her symptoms.  Recommend that she inquire about nerve conduction study when meeting with neurology to get a clear picture of how the test is performed so that she can make her best judgement on this. I do feel that the nerve study would give a clear indication of neuropathy vs. Other etiology.  Follow-up with neurology or me as needed.       Relevant Orders   CBC w/Diff/Platelet   Comprehensive metabolic panel   Lipid panel   B12 and Folate Panel   Thyroid Panel With TSH   VITAMIN D 25 Hydroxy (Vit-D Deficiency, Fractures)   Increased appetite    Increased appetite with 10 pound weight gain over past year.  This is unclear what is causing her increased appetite. Will measure thyroid function today to determine if this is a possibility.  Recommend monitoring food intake to see if she is indeed taking in excess calories or eating smaller meals throughout the day.  BMI is normal today with no concerns for excess weight.  Will make changes to the plan of care as necessary based on lab results.       Relevant Orders   CBC w/Diff/Platelet   Comprehensive metabolic panel   Lipid panel   B12 and Folate Panel   Thyroid Panel With TSH   VITAMIN D 25 Hydroxy (Vit-D Deficiency, Fractures)   Weight gain    Reported 10 lb weight gain over the past year.  She does endorse increased appetite, although her BMI is only 22.25 today.  I do feel that some of her increased weight is related to decreased physical activity related to her foot injury this past year.  Recommend that she begin walking as tolerated to help increase her physical activity levels, not for weight loss, but for overall health.  No concerns with weight today.  Will monitor labs for any dysfunction that could indicate a cause of the weight gain other than decreased exercise.  Will make changes to the plan of care as  necessary.       Relevant Orders   CBC w/Diff/Platelet   Comprehensive metabolic panel   Lipid panel   B12 and Folate Panel   Thyroid Panel With TSH   VITAMIN D 25 Hydroxy (Vit-D Deficiency, Fractures)   Dysuria    Urinary hesitancy reported with no other symptoms present. UA in the office today negative for infection. At this time the symptom etiology is unclear. If symptoms persist or new symptoms develop, recommend that she follow-up.       Relevant Orders   CBC w/Diff/Platelet   POCT URINALYSIS DIP (CLINITEK) (Completed)      Outpatient Encounter Medications as of 11/07/2020  Medication Sig  . gabapentin (NEURONTIN) 100 MG capsule Take 1 to 3 tabs (100mg  to 300mg ) by mouth up to three times a day for nerve pain. Start with 100mg  and increase only  if needed.  . Metoprolol Succinate 25 MG CS24 Take 25 mg by mouth daily.   No facility-administered encounter medications on file as of 11/07/2020.   Time: 65 minutes, >50% spent counseling, care coordination, chart review, and documentation.    Follow-up: Return in about 3 months (around 02/06/2021) for Follow-up neuropathy.   Clarise Cruz  Katheran James, Sanders

## 2020-11-07 NOTE — Assessment & Plan Note (Signed)
Urinary hesitancy reported with no other symptoms present. UA in the office today negative for infection. At this time the symptom etiology is unclear. If symptoms persist or new symptoms develop, recommend that she follow-up.

## 2020-11-07 NOTE — Assessment & Plan Note (Signed)
Reported 10 lb weight gain over the past year.  She does endorse increased appetite, although her BMI is only 22.25 today.  I do feel that some of her increased weight is related to decreased physical activity related to her foot injury this past year.  Recommend that she begin walking as tolerated to help increase her physical activity levels, not for weight loss, but for overall health.  No concerns with weight today.  Will monitor labs for any dysfunction that could indicate a cause of the weight gain other than decreased exercise.  Will make changes to the plan of care as necessary.

## 2020-11-07 NOTE — Assessment & Plan Note (Signed)
Increased fatigue since COVID infection in March.  Discussed with patient that even with mild COVID symptoms, fatigue can be a complication that can last several months after infection. Although most likely COVID etiology given timing onset, will obtain labs today for further evaluation. Recommend rest when she is tired and not push too hard during the morning hours. Naps are OK as long as they are not interfering with night time sleep.  Will make changes to the plan of care as necessary once test results received.

## 2020-11-07 NOTE — Assessment & Plan Note (Signed)
Increased appetite with 10 pound weight gain over past year.  This is unclear what is causing her increased appetite. Will measure thyroid function today to determine if this is a possibility.  Recommend monitoring food intake to see if she is indeed taking in excess calories or eating smaller meals throughout the day.  BMI is normal today with no concerns for excess weight.  Will make changes to the plan of care as necessary based on lab results.

## 2020-11-07 NOTE — Patient Instructions (Addendum)
Recommendations from today's visit:  We will get your labs today and make sure that everything looks ok. If we see any abnormalities we will let you know and make a plan of what to do for it.   I do recommend you try to the gabapentin for the foot pain to see if this is helpful. You can take it at bedtime to see how you tolerate it first.   I do recommend that you speak to Dr. Havery Moros about the results of the MRI tests that were done and see what he would like to do about those. He can review my labwork from today.   We will plan to follow-up in 3 months to see how you are doing unless we need to see you sooner for your labs or something that comes up.   __________________________________________________________ Thank you for choosing [place] at [facility] for your Primary Care needs. I am excited for the opportunity to partner with you to meet your health care goals. It was a pleasure meeting you today!  I am an Adult-Geriatric Nurse Practitioner with a background in caring for patients for more than 20 years. I received my Paediatric nurse in Nursing and my Doctor of Nursing Practice degrees at [employer]. I received additional fellowship training in primary care and sports medicine after receiving my doctorate degree. I provide primary care and sports medicine services to patients age 29 and older within this office. I am also a provider with the [facility] and the director of the APP Fellowship with [facility].  I am a [place] native, but have called the [place] area home for nearly 20 years and am proud to be a member of this community.   I am passionate about providing the best service to you through preventive medicine and supportive care. I consider you a part of the medical team and value your input. I work diligently to ensure that you are heard and your needs are met in a safe and effective manner. I want you to  feel comfortable with me as your provider and want you to know that your health concerns are important to me.   For your information, our office hours are Monday- Friday 8:00 AM - 5:00 PM At this time I am not in the office on Wednesdays.  If you have questions or concerns, please call our office at [phone] or send [place] a MyChart message and we will respond as quickly as possible.   For all urgent or time sensitive needs we ask that you please call the office to avoid delays. MyChart is not constantly monitored and replies may take up to 72 business hours.  MyChart Policy: . MyChart allows for you to see your visit notes, after visit summary, provider recommendations, lab and tests results, make an appointment, request refills, and contact your provider or the office for non-urgent questions or concerns.  . Providers are seeing patients during normal business hours and do not have built in time to review MyChart messages. We ask that you allow a minimum of 72 business hours for MyChart message responses.  . Complex MyChart concerns may require a visit. Your provider may request you schedule a virtual or in person visit to ensure we are providing the best care possible. . MyChart messages sent after 4:00 PM on Friday will not be received by the provider until Monday morning.    Lab and Test Results: . You will  receive your lab and test results on MyChart as soon as they are completed and results have been sent by the lab or testing facility. Due to this service, you will receive your results BEFORE your provider.  . Please allow a minimum of 72 business hours for your provider to receive and review lab and test results and contact you about.   . Most lab and test result comments from the provider will be sent through [facility]. Your provider may recommend changes to the plan of care, follow-up visits, repeat testing, ask questions, or request an office visit to discuss these results. You may reply  directly to this message or call the office at [phone] to provide information for the provider or set up an appointment. . In some instances, you will be called with test results and recommendations. Please let us know if this is preferred and we will make note of this in your chart to provide this for you.    . If you have not heard a response to your lab or test results in 72 business hours, please call the office to let us know.   After Hours: . For all non-emergency after hours needs, please call the office at [phone] and select the option to reach the on-call provider service. On-call services are shared between multiple [place] offices and therefore it will not be possible to speak directly with your provider. On-call providers may provide medical advice and recommendations, but are unable to provide refills for maintenance medications.  . For all emergency or urgent medical needs after normal business hours, we recommend that you seek care at the closest Urgent Care or Emergency Department to ensure appropriate treatment in a timely manner.  Nigel Bridgeman  at [facility] has a 24 hour emergency room located on the ground floor for your convenience.    Please do not hesitate to reach out to [place] with concerns.   Thank you, again, for choosing me as your health care partner. I appreciate your trust and look forward to learning more about you.   Worthy Keeler, DNP, AGNP-c ___________________________________________________________________________________

## 2020-11-07 NOTE — Assessment & Plan Note (Signed)
Known patient to provider from previous practice to establish care with current practice today.  Review of current and past medical history, social history, family history, allergies, and SDOH today.  Patient is due for Medicare Wellness visit.  Patient is due for follow-up labs today.  Update Tdap status needed- will do at [facility]

## 2020-11-07 NOTE — Assessment & Plan Note (Signed)
Symptoms consistent with neuropathy.  Discussed with patient the etiology of neuropathy and difference between plantar fascia pain. It is likely that the fascia has healed given the length of time since the tear and the treatments she has completed. It is possible that the tear may have exacerbated the neuropathic pain- but not entirely clear.  Will run tests to rule out thyroid, B12, or vitamin D etiology.  Recommend to the patient that she trial the gabapentin at bedtime with lowest dose to see if this is helpful for her symptoms.  Recommend that she inquire about nerve conduction study when meeting with neurology to get a clear picture of how the test is performed so that she can make her best judgement on this. I do feel that the nerve study would give a clear indication of neuropathy vs. Other etiology.  Follow-up with neurology or me as needed.

## 2020-11-07 NOTE — Assessment & Plan Note (Signed)
Osteopenia- will monitor vitamin D levels given recent increase in fatigue.  Labs ordered today.  Will make changes to the plan of care as necessary based on results.

## 2020-11-07 NOTE — Assessment & Plan Note (Signed)
History of osteopenia detected on DEXA exam.  Will monitor vitamin D levels given the increased fatigue and pain she is experiencing. Descriptors consistent with nerve pain, but would like to rule out other etiologies at this time.  Labs ordered today.

## 2020-11-09 DIAGNOSIS — M79672 Pain in left foot: Secondary | ICD-10-CM | POA: Diagnosis not present

## 2020-11-13 ENCOUNTER — Other Ambulatory Visit (HOSPITAL_BASED_OUTPATIENT_CLINIC_OR_DEPARTMENT_OTHER)
Admission: RE | Admit: 2020-11-13 | Discharge: 2020-11-13 | Disposition: A | Discharge status: Home / Self Care | Payer: Medicare Other | Source: Ambulatory Visit | Attending: Nurse Practitioner | Admitting: Nurse Practitioner

## 2020-11-13 ENCOUNTER — Other Ambulatory Visit (HOSPITAL_BASED_OUTPATIENT_CLINIC_OR_DEPARTMENT_OTHER): Payer: Medicare Other

## 2020-11-13 ENCOUNTER — Other Ambulatory Visit: Payer: Self-pay

## 2020-11-13 DIAGNOSIS — R632 Polyphagia: Secondary | ICD-10-CM | POA: Diagnosis not present

## 2020-11-13 DIAGNOSIS — R202 Paresthesia of skin: Secondary | ICD-10-CM

## 2020-11-13 DIAGNOSIS — I10 Essential (primary) hypertension: Secondary | ICD-10-CM

## 2020-11-13 DIAGNOSIS — R300 Dysuria: Secondary | ICD-10-CM | POA: Diagnosis not present

## 2020-11-13 DIAGNOSIS — M8580 Other specified disorders of bone density and structure, unspecified site: Secondary | ICD-10-CM

## 2020-11-13 DIAGNOSIS — M859 Disorder of bone density and structure, unspecified: Secondary | ICD-10-CM | POA: Diagnosis not present

## 2020-11-13 DIAGNOSIS — R635 Abnormal weight gain: Secondary | ICD-10-CM | POA: Diagnosis not present

## 2020-11-13 DIAGNOSIS — R5383 Other fatigue: Secondary | ICD-10-CM

## 2020-11-13 LAB — COMPREHENSIVE METABOLIC PANEL
ALT: 24 U/L (ref 0–44)
AST: 22 U/L (ref 15–41)
Albumin: 4.5 g/dL (ref 3.5–5.0)
Alkaline Phosphatase: 51 U/L (ref 38–126)
Anion gap: 10 (ref 5–15)
BUN: 19 mg/dL (ref 8–23)
CO2: 27 mmol/L (ref 22–32)
Calcium: 9.9 mg/dL (ref 8.9–10.3)
Chloride: 105 mmol/L (ref 98–111)
Creatinine, Ser: 0.65 mg/dL (ref 0.44–1.00)
GFR, Estimated: >60 mL/min (ref >60)
Glucose, Bld: 87 mg/dL (ref 70–99)
Potassium: 4.2 mmol/L (ref 3.5–5.1)
Sodium: 142 mmol/L (ref 135–145)
Total Bilirubin: 0.6 mg/dL (ref 0.3–1.2)
Total Protein: 6.7 g/dL (ref 6.5–8.1)

## 2020-11-13 LAB — LIPID PANEL
Cholesterol: 272 mg/dL — ABNORMAL HIGH (ref 0–200)
HDL: 54 mg/dL (ref >40)
LDL Cholesterol: 197 mg/dL — ABNORMAL HIGH (ref 0–99)
Total CHOL/HDL Ratio: 5 RATIO
Triglycerides: 107 mg/dL (ref <150)
VLDL: 21 mg/dL (ref 0–40)

## 2020-11-13 LAB — CBC WITH DIFFERENTIAL/PLATELET
Abs Immature Granulocytes: 0.01 10*3/uL (ref 0.00–0.07)
Basophils Absolute: 0 10*3/uL (ref 0.0–0.1)
Basophils Relative: 1 %
Eosinophils Absolute: 0 10*3/uL (ref 0.0–0.5)
Eosinophils Relative: 0 %
HCT: 44.4 % (ref 36.0–46.0)
Hemoglobin: 14.7 g/dL (ref 12.0–15.0)
Immature Granulocytes: 0 %
Lymphocytes Relative: 26 %
Lymphs Abs: 1.7 10*3/uL (ref 0.7–4.0)
MCH: 31.3 pg (ref 26.0–34.0)
MCHC: 33.1 g/dL (ref 30.0–36.0)
MCV: 94.5 fL (ref 80.0–100.0)
Monocytes Absolute: 0.5 10*3/uL (ref 0.1–1.0)
Monocytes Relative: 7 %
Neutro Abs: 4.3 10*3/uL (ref 1.7–7.7)
Neutrophils Relative %: 66 %
Platelets: 271 10*3/uL (ref 150–400)
RBC: 4.7 MIL/uL (ref 3.87–5.11)
RDW: 13.5 % (ref 11.5–15.5)
WBC: 6.5 10*3/uL (ref 4.0–10.5)
nRBC: 0 % (ref 0.0–0.2)

## 2020-11-13 LAB — VITAMIN D 25 HYDROXY (VIT D DEFICIENCY, FRACTURES): Vit D, 25-Hydroxy: 19.77 ng/mL — ABNORMAL LOW (ref 30–100)

## 2020-11-14 LAB — THYROID PANEL WITH TSH
Free Thyroxine Index: 1.2 (ref 1.2–4.9)
T3 Uptake Ratio: 18 % — ABNORMAL LOW (ref 24–39)
T4, Total: 6.9 ug/dL (ref 4.5–12.0)
TSH: 3.11 u[IU]/mL (ref 0.450–4.500)

## 2020-11-15 ENCOUNTER — Other Ambulatory Visit (HOSPITAL_BASED_OUTPATIENT_CLINIC_OR_DEPARTMENT_OTHER): Payer: Self-pay | Admitting: Nurse Practitioner

## 2020-11-15 ENCOUNTER — Telehealth (HOSPITAL_BASED_OUTPATIENT_CLINIC_OR_DEPARTMENT_OTHER): Payer: Self-pay

## 2020-11-15 DIAGNOSIS — E7800 Pure hypercholesterolemia, unspecified: Secondary | ICD-10-CM

## 2020-11-15 DIAGNOSIS — M859 Disorder of bone density and structure, unspecified: Secondary | ICD-10-CM

## 2020-11-15 DIAGNOSIS — E559 Vitamin D deficiency, unspecified: Secondary | ICD-10-CM

## 2020-11-15 MED ORDER — ATORVASTATIN CALCIUM 40 MG PO TABS: 40.0000 mg | ORAL_TABLET | Freq: Every day | ORAL | 3 refills | Status: DC

## 2020-11-15 MED ORDER — VITAMIN D3 25 MCG (1000 UT) PO CAPS: 1000.0000 [IU] | ORAL_CAPSULE | Freq: Every day | ORAL | 3 refills | Status: AC

## 2020-11-15 NOTE — Telephone Encounter (Signed)
Called patient to go over lab results.  Patient is aware and agreeable to recommendations.

## 2020-11-15 NOTE — Telephone Encounter (Signed)
-----   Message from Orma Render, NP sent at 11/15/2020  8:28 AM EDT ----- Please call patient with lab results.   Her thyroid function was normal. No changes needed at this time.  Vitamin D levels are a little low. Recommend starting a vitamin D supplement to help with bone health. I will send this to the pharmacy.  Her cholesterol is elevated. Please ask if she was fasting when she had the labs drawn?  If she was fasting, the numbers are elevated enough that we need to consider medication to help lower the bad cholesterol. Her good cholesterol is about where it needs to be, but not high enough to counterbalance the bad cholesterol.  I would like her to focus on a diet low in saturated fats- these are fried foods, fatty foods, and oils that are solid at room temperature.   Her white blood counts and red blood counts are normal. Her electrolytes, kidney function, and liver function are all normal.   If she would like to discuss the medications that I am recommending, please have her schedule an appointment (40 minutes) to discuss these recommendations. Otherwise, I will send the medicine to the pharmacy for her to start. Please let me know what she decides.

## 2020-11-15 NOTE — Progress Notes (Signed)
Please call patient with lab results.   Her thyroid function was normal. No changes needed at this time.  Vitamin D levels are a little low. Recommend starting a vitamin D supplement to help with bone health. I will send this to the pharmacy.  Her cholesterol is elevated. Please ask if she was fasting when she had the labs drawn?  If she was fasting, the numbers are elevated enough that we need to consider medication to help lower the bad cholesterol. Her good cholesterol is about where it needs to be, but not high enough to counterbalance the bad cholesterol.  I would like her to focus on a diet low in saturated fats- these are fried foods, fatty foods, and oils that are solid at room temperature.   Her white blood counts and red blood counts are normal. Her electrolytes, kidney function, and liver function are all normal.   If she would like to discuss the medications that I am recommending, please have her schedule an appointment (40 minutes) to discuss these recommendations. Otherwise, I will send the medicine to the pharmacy for her to start. Please let me know what she decides.

## 2020-11-16 ENCOUNTER — Encounter: Payer: Self-pay | Admitting: Gastroenterology

## 2020-11-16 ENCOUNTER — Ambulatory Visit (INDEPENDENT_AMBULATORY_CARE_PROVIDER_SITE_OTHER): Payer: Medicare Other | Admitting: Gastroenterology

## 2020-11-16 VITALS — BP 110/72 | HR 59 | Ht 64.0 in | Wt 130.0 lb

## 2020-11-16 DIAGNOSIS — K862 Cyst of pancreas: Secondary | ICD-10-CM | POA: Diagnosis not present

## 2020-11-16 DIAGNOSIS — Z86010 Personal history of colonic polyps: Secondary | ICD-10-CM

## 2020-11-16 DIAGNOSIS — K760 Fatty (change of) liver, not elsewhere classified: Secondary | ICD-10-CM

## 2020-11-16 MED ORDER — PLENVU 140 G PO SOLR: 1.0000 | Freq: Once | ORAL | 0 refills | Status: AC

## 2020-11-16 NOTE — Patient Instructions (Addendum)
If you are age 68 or older, your body mass index should be between 23-30. Your Body mass index is 22.31 kg/m. If this is out of the aforementioned range listed, please consider follow up with your Primary Care Provider.  If you are age 65 or younger, your body mass index should be between 19-25. Your Body mass index is 22.31 kg/m. If this is out of the aformentioned range listed, please consider follow up with your Primary Care Provider.   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  The number on your caller ID will be [phone], please answer when they call.  If you have not heard from them in 2 days please call [phone] to schedule.    You have been scheduled for a colonoscopy. Please follow written instructions given to you at your visit today.  Please pick up your prep supplies at the pharmacy within the next 1-3 days. If you use inhalers (even only as needed), please bring them with you on the day of your procedure.   Thank you for entrusting me with your care and for choosing [facility], Dr. Lamont Cellar

## 2020-11-16 NOTE — Progress Notes (Signed)
HPI :  70 year old female here for follow-up visit for discussion of surveillance colonoscopy and to review prior imaging abnormalities showing pancreatic cyst and hepatic steatosis.  I last saw her in June 2021.  At that time she was having atypical chest pain, unclear if this was related to reflux or not but her symptoms resolved and she states they have not really recurred.  She denies really any pyrosis or heartburn that bothers her.  She also complained of some fecal soiling and leakage which we had discussed, I referred her to pelvic floor PT and recommended a daily fiber supplement.  She states she may be went to one course of pelvic floor PT but did not really follow through with it due to [place] she was not comfortable pursuing that.  She states in general the leakage has been much better and not really bothers her anymore otherwise.  Her last colonoscopy was in December 2016, she had a 5 mm sessile serrated adenoma removed at that time.  She is due for surveillance and she wishes to proceed with that.  Patient otherwise tells me she was admitted in October of this past year with obstructive nephrolithiasis.  She had CT scan imaging which showed a renal stone but also a pancreatic cyst, that led to a follow-up MRI as below.  She inquires about pancreatic cyst and what this entails in terms of surveillance and management.  Her husband died of pancreatic cancer and she is very sensitive to this.  No family history otherwise of pancreatic cancer.  She also was noted to have hepatic steatosis on the MRI.  She does not drink any alcohol at all.  She has gained 10 pounds over the past year.  She otherwise denies any abdominal pains.  She is having regular bowel movements, no blood in her stools, she denies any cardiopulmonary complaints at this time.  Of note, her last colonoscopy was performed in December 2016 in [place], at which time she had one 5 mm sessile serrated adenoma removed, told to  repeat in 5 years.   Echo 08/06/19 - normal LV function, mild MR and TR  CTA 09/29/19 - no evidence of CAD  CT abdomen / pelvis with contrast 05/08/20: IMPRESSION: 1. Questionable area of colonic wall thickening and pericolonic edema involving the redundant sigmoid colon. This may represent short segment colitis or diverticulitis. No perforation. 2. Right pelvic 6 mm calcification, difficult to differentiate if this is within or adjacent to the right ureter or an adjacent phlebolith. There is mild right hydroureter and dilatation of the right renal collecting system without significant perinephric edema. There is also dilatation of the left renal pelvis suggesting underlying extrarenal pelvis configuration. 3. Additional nonobstructing right renal stone. 4. Low-density lesions in the liver, largest measuring 11 x 11 mm in the far left lobe of the liver, nonspecific. Given history of breast cancer, recommend further evaluation with hepatic protocol MRI. There is also a 12 mm low-density lesion in the pancreatic tail. Recommend further characterization with pancreatic protocol MRI on an elective basis. 5. Small hiatal hernia.   MRI abdomen 05/08/20 - IMPRESSION: 1. There are at least 3 nonenhancing liver lesions corresponding to the CT abnormality. No abnormal enhancement associated with these lesions which are favored to represent benign cysts. 2. Hepatic steatosis. 3. Multiple small scattered cysts within the pancreas. The largest is in the distal tail of pancreas measuring 1.7 cm. Interval surveillance imaging is recommended. Initial follow-up examination should be obtained in 6  months with repeat MRI without and with contrast material. This recommendation follows ACR consensus guidelines: Management of Incidental Pancreatic Cysts: A White Paper of the ACR Incidental Findings Committee. [place] 9702;63:785-885. 4. Persistent right-sided hydronephrosis, hydroureter  and perinephric fat stranding secondary to distal right ureteral calculus.    Past Medical History:  Diagnosis Date  . Breast cancer ([place]) 0277,4128   R breast cancer x 2; lumpectomy with radiation, chemotherapy; mastectomy R.  . Colon polyps   . Coronary artery disease involving native coronary artery of native heart without angina pectoris 06/28/2015   Overview:  underwent cardiac cath/PTCA in 1992 at age 27   12/1990-cardiac catheterization/PTCA 08/1991-cardiac catheterization 06/01/2003-treadmill dual-isotope stress test-normal perfusion, 15.8 METs 02/28/2004-coronary artery calcium score-1342.9 02/04/2005-treadmill dual-isotope stress test-normal perfusion, 13.5 METs 01/26/2007-stress echocardiogram-normal, 13 METs 04/27/2008-treadmill dual-isotop  . Encounter for medical examination to establish care 05/29/2020  . Flank pain 09/06/2016  . Hyperlipidemia   . Hypertension   . Nephrolithiasis   . Personal history of chemotherapy   . Personal history of radiation therapy   . Pneumonia   . Skin infection 11/25/2017  . Tick bite 11/25/2017     Past Surgical History:  Procedure Laterality Date  . ABDOMINAL HYSTERECTOMY    . BREAST BIOPSY Left 2016  . BREAST LUMPECTOMY Right 1999  . MASTECTOMY Right    2014  . PARTIAL HYSTERECTOMY  1986   DUB; ovaries intact.   Family History  Problem Relation Age of Onset  . Heart disease Mother   . Colon cancer Maternal Grandfather   . Heart attack Maternal Grandfather        or ? stroke  . Colon cancer Maternal Aunt   . Thyroid disease Son   . Breast cancer Neg Hx    Social History   Tobacco Use  . Smoking status: Never Smoker  . Smokeless tobacco: Never Used  Vaping Use  . Vaping Use: Never used  Substance Use Topics  . Alcohol use: Yes    Comment: rarely  . Drug use: Never   Current Outpatient Medications  Medication Sig Dispense Refill  . Cholecalciferol (VITAMIN D3) 25 MCG (1000 UT) CAPS Take 1 capsule (1,000 Units total) by  mouth daily. 90 capsule 3  . gabapentin (NEURONTIN) 100 MG capsule Take 1 to 3 tabs (100mg  to 300mg ) by mouth up to three times a day for nerve pain. Start with 100mg  and increase only  if needed. 120 capsule 3  . Metoprolol Succinate 25 MG CS24 Take 25 mg by mouth daily. 90 capsule 0   No current facility-administered medications for this visit.   Allergies  Allergen Reactions  . Sublimaze [Fentanyl] Shortness Of Breath     Review of Systems: All systems reviewed and negative except where noted in HPI.   Lab Results  Component Value Date   WBC 6.5 11/13/2020   HGB 14.7 11/13/2020   HCT 44.4 11/13/2020   MCV 94.5 11/13/2020   PLT 271 11/13/2020    Lab Results  Component Value Date   CREATININE 0.65 11/13/2020   BUN 19 11/13/2020   NA 142 11/13/2020   K 4.2 11/13/2020   CL 105 11/13/2020   CO2 27 11/13/2020    Lab Results  Component Value Date   ALT 24 11/13/2020   AST 22 11/13/2020   ALKPHOS 51 11/13/2020   BILITOT 0.6 11/13/2020     Physical Exam: BP 110/72   Pulse (!) 59   Ht 5\' 4"  (1.626 m)  Wt 130 lb (59 kg)   BMI 22.31 kg/m  Constitutional: Pleasant,well-developed, female in no acute distress. Neurological: Alert and oriented to person place and time. Psychiatric: Normal mood and affect. Behavior is normal.   ASSESSMENT AND PLAN: 70 year old female here for reassessment of the following issues:  Pancreatic cyst Fatty liver History of colon polyps  The patient had incidentally noted pancreatic cysts on recent CT scan and MRI.  She has multiple small pancreatic cysts, the largest is 1.7 cm in size.  We discussed that hopefully these are benign and never cause problems but can be precancerous and have potential to increase her risk for pancreatic cancer.  Recommend a follow-up MRCP at this time, 6 months from her last exam to assess for interval growth.  I discussed that if this has increased in size or has any high risk features we may consider an  endoscopic ultrasound explained to her what that is.  Things are stable we may just follow this with surveillance MRI.  Otherwise reviewed hepatic steatosis with her, she has no history of alcohol use and her LFTs are normal.  I discussed spectrum of fatty liver disease, risk for fibrotic change and cirrhosis over time.  She is not overweight at all however has gained 10 pounds in the past year, she will watch her weight and check LFTs once yearly.  Otherwise she is due for surveillance colonoscopy at this time.  Discussed risk and benefits and she wants to proceed, further recommendations pending results.  Plan: - MRCP to survey pancreatic cyst - counseled on fatty liver as above, repeat LFTs in 1 year - colonoscopy for polyp surveillance  [place] Cellar, MD [facility]

## 2020-11-25 ENCOUNTER — Ambulatory Visit (HOSPITAL_COMMUNITY): Payer: Medicare Other

## 2020-11-29 ENCOUNTER — Telehealth (HOSPITAL_BASED_OUTPATIENT_CLINIC_OR_DEPARTMENT_OTHER): Payer: Self-pay | Admitting: Nurse Practitioner

## 2020-11-29 NOTE — Telephone Encounter (Signed)
Let pt know we had to cancel due to provider being out of the office. She stated she would call back to resch at another time.

## 2020-11-30 ENCOUNTER — Ambulatory Visit (INDEPENDENT_AMBULATORY_CARE_PROVIDER_SITE_OTHER): Payer: Medicare Other | Admitting: Nurse Practitioner

## 2020-11-30 ENCOUNTER — Other Ambulatory Visit: Payer: Self-pay

## 2020-11-30 ENCOUNTER — Ambulatory Visit (HOSPITAL_BASED_OUTPATIENT_CLINIC_OR_DEPARTMENT_OTHER): Payer: Medicare Other | Admitting: Nurse Practitioner

## 2020-11-30 ENCOUNTER — Encounter (HOSPITAL_BASED_OUTPATIENT_CLINIC_OR_DEPARTMENT_OTHER): Payer: Self-pay | Admitting: Nurse Practitioner

## 2020-11-30 VITALS — BP 143/70 | HR 62 | Ht 64.0 in | Wt 132.2 lb

## 2020-11-30 DIAGNOSIS — M778 Other enthesopathies, not elsewhere classified: Secondary | ICD-10-CM

## 2020-11-30 NOTE — Patient Instructions (Signed)
Ice the wrist for 20 minutes at a time 4 times a day today and tomorrow.  Keep the wrist wrapped up and immobilized all day today and tomorrow.   On Saturday, you can begin exercises. Do these 3-4 times a day (see the print out)and then ice the wrist for 20 minutes. Wrap the wrist back up in between exercise sessions for at least 5 days.  After 5 days, if the wrist feels better, you can leave the wrap off but continue to do the exercises at least 2 times a day and ice afterwards.   The tendon is swollen and can take some time to heal, but if it is still bothering you or if you have any worse pain, please let me know.   You can take 400mg  - 600mg  of Ibuprofen every 6-8 hours for swelling and pain. I recommend that today and tomorrow you keep it in your system to help keep the swelling and pain to a minimum, then you can take it as needed.   If it is not any better by next Thursday, please let me know.    Wrist Sprain, Adult-- Tendon Inflammation A wrist sprain is a stretch or tear in the strong tissues that connect the wrist bones to each other. These strong tissues are called ligaments. There are three types of wrist sprains:  Grade 1. The ligament is stretched more than normal. There may be a minor amount of wrist pain.  Grade 2. The ligament is partially torn. You may be able to move your wrist, but not very much. There may be a moderate amount of wrist pain.  Grade 3. The ligament or ligaments are completely torn. You may find it difficult to move your wrist even a little. There may be a significant amount of wrist pain. What are the causes? This condition may be caused by using the wrist too much during sports, exercise, or work. It can also happen due to a fall or during an accident. What increases the risk? You are more likely to develop this condition if:  You had a previous wrist or arm injury.  You have poor wrist strength and flexibility.  You play contact sports, such as  football or soccer.  You participate in sports that may result in a fall, such as skateboarding, biking, skiing, or snowboarding.  You do not exercise regularly.  You use exercise equipment that does not fit well. What are the signs or symptoms? Symptoms of this condition include:  Pain in the wrist, arm, or hand.  Swelling or bruised skin near the wrist, hand, or arm. The skin may look yellow or blue.  Stiffness or trouble moving the hand.  Hearing a noise, like a pop or a snap, at the time of injury, or feeling a tear at the time of the injury.  A warm feeling in the skin around the wrist. How is this diagnosed? This condition is diagnosed with a physical exam. Sometimes an X-ray is taken to make sure a bone did not break. You may also have an MRI of your wrist to check for torn ligaments. How is this treated? This condition is treated by resting and applying ice to your wrist. Additional treatment may include:  Taking medicine for pain and inflammation.  Wearing a splint, brace, or cast for a short period of time to keep your wrist from moving (immobilized).  Doing exercises to strengthen and stretch your wrist.  Having surgery. This may be done if the  ligament is completely torn. Follow these instructions at home: If you have a splint or brace:  Wear the splint or brace as told by your health care provider. Remove it only as told by your health care provider.  Loosen it if your fingers tingle, become numb, or turn cold and blue.  Keep it clean.  If the splint or brace is not waterproof: ? Do not let it get wet. ? Cover it with a watertight covering when you take a bath or a shower. If you have a cast:  Do not put pressure on any part of the cast until it is fully hardened. This may take several hours.  Do not stick anything inside the cast to scratch your skin. Doing that increases your risk of infection.  Check the skin around the cast every day. Tell your health  care provider about any concerns.  You may put lotion on dry skin around the edges of the cast. Do not put lotion on the skin underneath the cast.  Keep it clean.  If the cast is not waterproof: ? Do not let it get wet. ? Cover it with a watertight covering when you take a bath or shower. Managing pain, stiffness, and swelling  If directed, put ice on the injured area. To do this: ? If you have a removable splint or brace, remove it as told by your health care provider. ? Put ice in a plastic bag. ? Place a towel between your skin and the bag or between the splint or cast and the bag. ? Leave the ice on for 20 minutes, 2-3 times a day. ? Remove the ice if your skin turns bright red. This is very important. If you cannot feel pain, heat, or cold, you have a greater risk of damage to the area.  Move your fingers often to reduce stiffness and swelling.  Raise (elevate) the injured area above the level of your heart while you are sitting or lying down.   Activity  Rest your wrist as told by your health care provider. Do not do things that cause pain.  Ask your health care provider when it is safe to drive if you have a splint, brace, or cast on your wrist.  Do exercises as told by your health care provider.  Return to your normal activities as told by your health care provider. Ask your health care provider what activities are safe for you. General instructions  Take over-the-counter and prescription medicines only as told by your health care provider.  Do not use any products that contain nicotine or tobacco, such as cigarettes, e-cigarettes, and chewing tobacco. These can delay healing. If you need help quitting, ask your health care provider.  Keep all follow-up visits. This is important. Contact a health care provider if:  Your pain, bruising, or swelling gets worse.  Your skin becomes red, gets a rash, or has open sores.  Your pain does not get better or it gets  worse. Get help right away if:  You have a new or sudden sharp pain in the hand, arm, or wrist.  You have tingling or numbness in your hand.  Your fingers turn white, very red, or cold and blue.  You cannot move your fingers. Summary  A wrist sprain is damage to ligaments in your wrist.  Wrist sprains can range from mild to severe.  Return to your normal activities as told by your health care provider. Ask your health care provider what activities  are safe for you.  You may need to wear a splint, brace, or cast for a short period of time. This information is not intended to replace advice given to you by your health care provider. Make sure you discuss any questions you have with your health care provider. Document Revised: 11/08/2019 Document Reviewed: 11/08/2019 Elsevier Patient Education  2021 [employer].

## 2020-11-30 NOTE — Progress Notes (Addendum)
Acute Office Visit  Subjective:    Patient ID: Cheyenne Sanders, female    DOB: [birth year], 70 y.o.   MRN: [id number]  Chief Complaint  Patient presents with   Wrist Pain   Arm Pain    Left hand, wrist and arm pain since yesterday.  Swollen on yesterday.    HPI Patient is in today for pain and inflammation to the left wrist Reports working outside in the yard yesterday and noticed wrist pain after several hours of working Pain sometimes radiates into shoulder on same side No weakness, dizziness, chest pain, numbness, tingling, sweating, nausea, ecchymosis. Son took BP after pain reported- normal Swelling and pain noted at the wrist joint, which has improved with limited use   Past Medical History:  Diagnosis Date   Breast cancer [facility]) [phone]   R breast cancer x 2; lumpectomy with radiation, chemotherapy; mastectomy R.   Colon polyps    Coronary artery disease involving native coronary artery of native heart without angina pectoris 06/28/2015   Overview:  underwent cardiac cath/PTCA in 1992 at age 78   12/1990-cardiac catheterization/PTCA 08/1991-cardiac catheterization 06/01/2003-treadmill dual-isotope stress test-normal perfusion, 15.8 METs 02/28/2004-coronary artery calcium score-1342.9 02/04/2005-treadmill dual-isotope stress test-normal perfusion, 13.5 METs 01/26/2007-stress echocardiogram-normal, 13 METs 04/27/2008-treadmill dual-isotop   Encounter for medical examination to establish care 05/29/2020   Flank pain 09/06/2016   Hyperlipidemia    Hypertension    Nephrolithiasis    Neuromuscular disorder ([place])    Personal history of chemotherapy    Personal history of radiation therapy    Pneumonia    Skin infection 11/25/2017   Tick bite 11/25/2017    Past Surgical History:  Procedure Laterality Date   ABDOMINAL HYSTERECTOMY     BREAST BIOPSY Left 2016   BREAST LUMPECTOMY Right 1999   MASTECTOMY Right    2014   PARTIAL HYSTERECTOMY  1986   DUB; ovaries intact.     Family History  Problem Relation Age of Onset   Heart disease Mother    Colon cancer Maternal Aunt    Colon cancer Maternal Grandfather    Heart attack Maternal Grandfather        or ? stroke   Thyroid disease Son    Breast cancer Neg Hx    Esophageal cancer Neg Hx    Stomach cancer Neg Hx     Social History   Socioeconomic History   Marital status: Widowed    Spouse name: Not on file   Number of children: 2   Years of education: Not on file   Highest education level: Not on file  Occupational History   Occupation: retired  Tobacco Use   Smoking status: Never   Smokeless tobacco: Never  Vaping Use   Vaping Use: Never used  Substance and Sexual Activity   Alcohol use: Yes    Comment: rarely   Drug use: Never   Sexual activity: Not Currently    Partners: Male  Other Topics Concern   Not on file  Social History Narrative   Marital status: widowed x 2014; married x 11 years; not dating; not interested      Children:  2 sons; 4 grandchildren      Lives: alone; family in [place]      Employed: homemaker; previous work in [place]; 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      Tobacco: none      Alcohol: rare; social      Exercise: no formal exercise.  ADLs: independent with ADLs   Social Determinants of Health   Financial Resource Strain: Not on file  Food Insecurity: Not on file  Transportation Needs: Not on file  Physical Activity: Not on file  Stress: Not on file  Social Connections: Not on file  Intimate Partner Violence: Not At Risk   Fear of Current or Ex-Partner: No   Emotionally Abused: No   Physically Abused: No   Sexually Abused: No    Outpatient Medications Prior to Visit  Medication Sig Dispense Refill   Cholecalciferol (VITAMIN D3) 25 MCG (1000 UT) CAPS Take 1 capsule (1,000 Units total) by mouth daily. 90 capsule 3   gabapentin (NEURONTIN) 100 MG capsule Take 1 to 3 tabs (100mg  to 300mg ) by mouth up to three times a day for nerve pain. Start  with 100mg  and increase only  if needed. 120 capsule 3   Metoprolol Succinate 25 MG CS24 Take 25 mg by mouth daily. 90 capsule 0   No facility-administered medications prior to visit.    Allergies  Allergen Reactions   Sublimaze [Fentanyl] Shortness Of Breath    Review of Systems All review of systems negative except what is listed in the HPI     Objective:    Physical Exam Vitals and nursing note reviewed.  Constitutional:      Appearance: Normal appearance.  HENT:     Head: Normocephalic.  Musculoskeletal:        General: Swelling and tenderness present. Normal range of motion.       Arms:  Skin:    General: Skin is warm and dry.     Capillary Refill: Capillary refill takes less than 2 seconds.  Neurological:     General: No focal deficit present.     Mental Status: She is alert and oriented to person, place, and time.  Psychiatric:        Mood and Affect: Mood normal.        Behavior: Behavior normal.        Thought Content: Thought content normal.        Judgment: Judgment normal.    BP (!) 143/70   Pulse 62   Ht 5\' 4"  (1.626 m)   Wt 132 lb 3.2 oz (60 kg)   SpO2 100%   BMI 22.69 kg/m  Wt Readings from Last 3 Encounters:  01/09/21 130 lb (59 kg)  11/30/20 132 lb 3.2 oz (60 kg)  11/16/20 130 lb (59 kg)    Health Maintenance Due  Topic Date Due   TETANUS/TDAP  Never done   Zoster Vaccines- Shingrix (1 of 2) Never done   COVID-19 Vaccine (3 - Pfizer risk series) 01/07/2020    There are no preventive care reminders to display for this patient.   Lab Results  Component Value Date   TSH 3.110 11/13/2020   Lab Results  Component Value Date   WBC 6.5 11/13/2020   HGB 14.7 11/13/2020   HCT 44.4 11/13/2020   MCV 94.5 11/13/2020   PLT 271 11/13/2020   Lab Results  Component Value Date   NA 142 11/13/2020   K 4.2 11/13/2020   CO2 27 11/13/2020   GLUCOSE 87 11/13/2020   BUN 19 11/13/2020   CREATININE 0.65 11/13/2020   BILITOT 0.6 11/13/2020    ALKPHOS 51 11/13/2020   AST 22 11/13/2020   ALT 24 11/13/2020   PROT 6.7 11/13/2020   ALBUMIN 4.5 11/13/2020   CALCIUM 9.9 11/13/2020   ANIONGAP 10 11/13/2020  Lab Results  Component Value Date   CHOL 272 (H) 11/13/2020   Lab Results  Component Value Date   HDL 54 11/13/2020   Lab Results  Component Value Date   LDLCALC 197 (H) 11/13/2020   Lab Results  Component Value Date   TRIG 107 11/13/2020   Lab Results  Component Value Date   CHOLHDL 5.0 11/13/2020   Lab Results  Component Value Date   HGBA1C 5.5 06/17/2017       Assessment & Plan:   Problem List Items Addressed This Visit     Left wrist tendonitis - Primary    Sx and presentation consistent with tendonitis most likely due to overuse. Ice 76min x QID and 400-600mg  Ibuprofen up to every 8 hours x 2 days then as needed for pain and swelling. Wrist immobilized with compression wrap- maintain wrap x2 days then begin exercises Handout and demonstration provided Exercise and ice x4 times a day starting Saturday through Thursday. May use wrap in between sessions to reduce pain. Complete resolution may take up to 4 weeks, avoid repetitive activity, heavy lifting, or pulling with wrist for 4 weeks.  F/U if symptoms worsen or fail to improve         No orders of the defined types were placed in this encounter.    Orma Render, NP

## 2020-12-01 ENCOUNTER — Telehealth: Payer: Self-pay | Admitting: Gastroenterology

## 2020-12-01 DIAGNOSIS — M778 Other enthesopathies, not elsewhere classified: Secondary | ICD-10-CM

## 2020-12-01 HISTORY — DX: Other enthesopathies, not elsewhere classified: M77.8

## 2020-12-01 MED ORDER — DIAZEPAM 5 MG PO TABS: 5.0000 mg | ORAL_TABLET | Freq: Once | ORAL | 0 refills | Status: DC

## 2020-12-01 MED ORDER — DIAZEPAM 5 MG PO TABS: 5.0000 mg | ORAL_TABLET | Freq: Once | ORAL | 0 refills | Status: AC

## 2020-12-01 NOTE — Telephone Encounter (Signed)
Unable to print prescription for Valium 5 mg once 30 mins prior to MRI due to printer error.  Script was phoned in to [facility]. Caryl Pina rec'd the order. Called patient and let her know script has been sent to pharmacy

## 2020-12-01 NOTE — Telephone Encounter (Signed)
How about valium 5mg  x 1 30 minutes prior to MRI. Thanks

## 2020-12-01 NOTE — Assessment & Plan Note (Signed)
Sx and presentation consistent with tendonitis most likely due to overuse. Ice 45min x QID and 400-600mg  Ibuprofen up to every 8 hours x 2 days then as needed for pain and swelling. Wrist immobilized with compression wrap- maintain wrap x2 days then begin exercises Handout and demonstration provided Exercise and ice x4 times a day starting Saturday through Thursday. May use wrap in between sessions to reduce pain. Complete resolution may take up to 4 weeks, avoid repetitive activity, heavy lifting, or pulling with wrist for 4 weeks.  F/U if symptoms worsen or fail to improve

## 2020-12-01 NOTE — Telephone Encounter (Signed)
Pt is scheduled for MRI tomorrow and would like something prescribed to calm her nerves during the MRI.

## 2020-12-01 NOTE — Telephone Encounter (Signed)
Please advise thanks.

## 2020-12-02 ENCOUNTER — Other Ambulatory Visit: Payer: Self-pay

## 2020-12-02 ENCOUNTER — Other Ambulatory Visit: Payer: Self-pay | Admitting: Gastroenterology

## 2020-12-02 ENCOUNTER — Ambulatory Visit (HOSPITAL_COMMUNITY)
Admission: RE | Admit: 2020-12-02 | Discharge: 2020-12-02 | Disposition: A | Discharge status: Home / Self Care | Payer: Medicare Other | Source: Ambulatory Visit | Attending: Gastroenterology | Admitting: Gastroenterology

## 2020-12-02 DIAGNOSIS — K5730 Diverticulosis of large intestine without perforation or abscess without bleeding: Secondary | ICD-10-CM | POA: Diagnosis not present

## 2020-12-02 DIAGNOSIS — K862 Cyst of pancreas: Secondary | ICD-10-CM | POA: Diagnosis not present

## 2020-12-02 DIAGNOSIS — K8020 Calculus of gallbladder without cholecystitis without obstruction: Secondary | ICD-10-CM | POA: Diagnosis not present

## 2020-12-02 IMAGING — MR MR ABDOMEN WO/W CM MRCP
15 of 17 series · 44 of 48 positions shown · IV contrast (with contrast)
Comparison: [DATE] MRI and CT.

CLINICAL DATA: Pancreatic cyst/pseudocyst.

EXAM:
MRI ABDOMEN WITHOUT AND WITH CONTRAST (INCLUDING MRCP)
TECHNIQUE: Multiplanar multisequence MR imaging of the abdomen was performed
both before and after the administration of intravenous contrast.
Heavily T2-weighted images of the biliary and pancreatic ducts were
obtained, and three-dimensional MRCP images were rendered by post
processing.
CONTRAST:  6mL GADAVIST GADOBUTROL 1 MMOL/ML IV SOLN

[Series 3: T2 · coronal · 7.0mm · 1.56mm/px · 1 of 27 slices shown (1 of 2)]
[im 1/27]
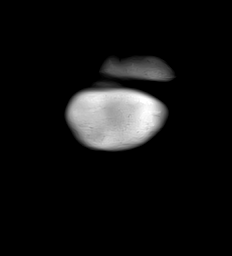

[Series 4: T2 fat-sat · axial · 6.0mm · 1.25mm/px · 1 of 36 slices shown]
[im 1/36]
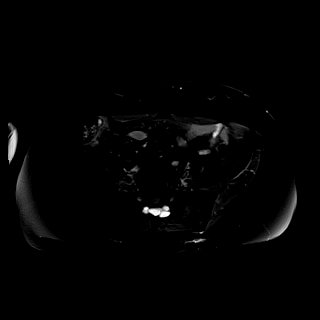

[Series 6: T1 · axial · 3.1mm · 1.25mm/px · z∈[-92,+128]mm · 3 of 72 slices shown (1 of 2)]
[im 1/72]
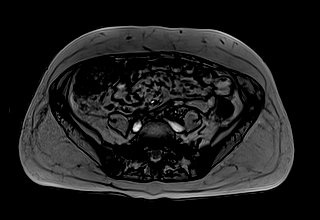
[im 36/72]
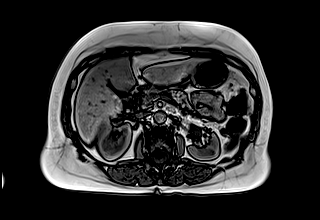
[im 72/72]
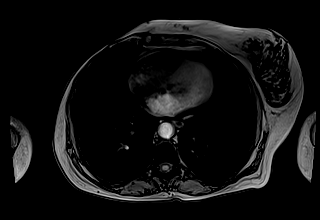

[Series 7: T1 · axial · 3.1mm · 1.25mm/px · z∈[-92,+128]mm · 3 of 72 slices shown (2 of 2)]
[im 1/72]
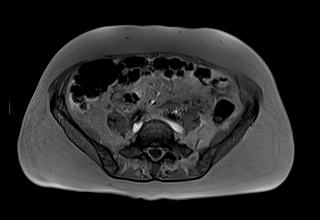
[im 36/72]
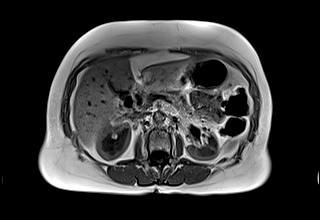
[im 72/72]
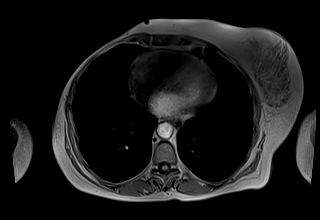

[Series 8: DWI · axial · 6.0mm · 1.49mm/px · z∈[-108,+144]mm · 3 of 72 slices shown (1 of 2)]
[im 1/72]
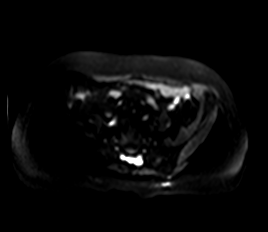
[im 36/72]
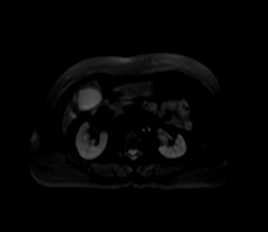
[im 72/72]
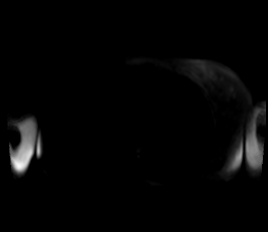

[Series 9: DWI · axial · 6.0mm · 1.49mm/px · z∈[-108,+144]mm · 2 of 36 slices shown (2 of 2)]
[im 1/36]
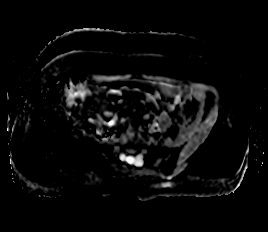
[im 36/36]
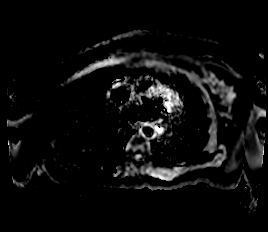

[Series 10: bSSFP · axial · 7.0mm · 1.25mm/px · 1 of 26 slices shown]
[im 1/26]
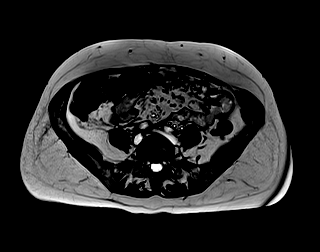

[Series 12: T1 dynamic · axial · 3.0mm · 1.25mm/px · z∈[-91,+122]mm · 4 of 72 slices shown (1 of 7)]
[im 1/72]
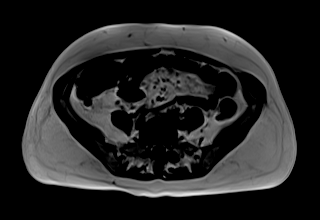
[im 24/72]
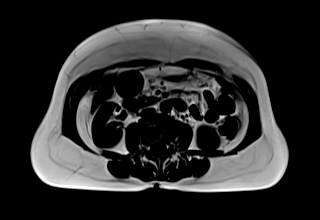
[im 48/72]
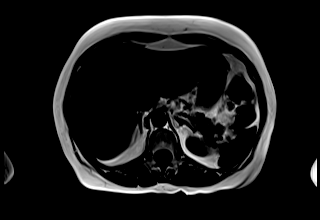
[im 72/72]
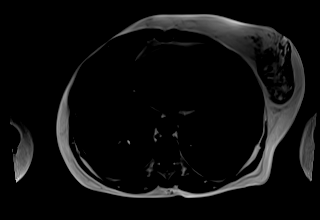

[Series 13: T1 dynamic · axial · 3.0mm · 1.25mm/px · z∈[-91,+122]mm · 4 of 72 slices shown (2 of 7)]
[im 1/72]
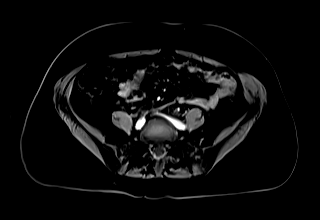
[im 24/72]
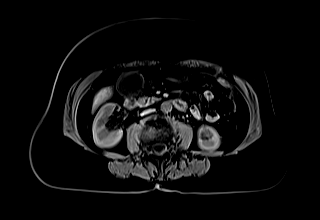
[im 48/72]
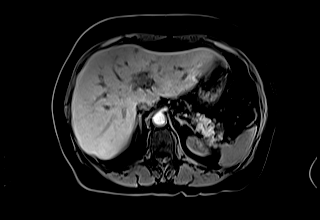
[im 72/72]
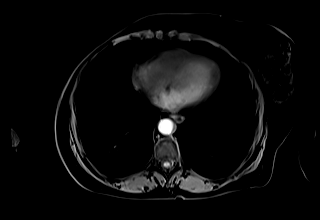

[Series 16: T1 dynamic · axial · 3.0mm · 1.25mm/px · z∈[-91,+122]mm · 4 of 72 slices shown (3 of 7)]
[im 1/72]
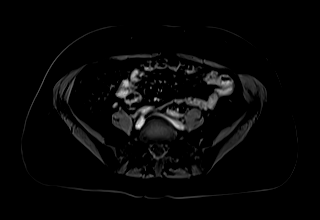
[im 24/72]
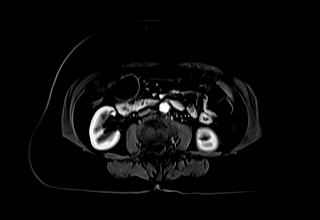
[im 48/72]
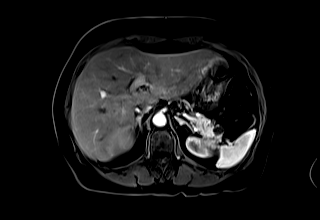
[im 72/72]
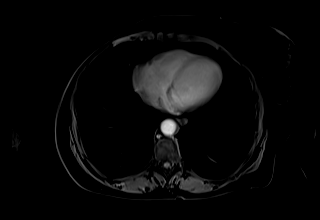

[Series 18: T1 dynamic · axial · 3.0mm · 1.25mm/px · z∈[-91,+122]mm · 4 of 72 slices shown (4 of 7)]
[im 1/72]
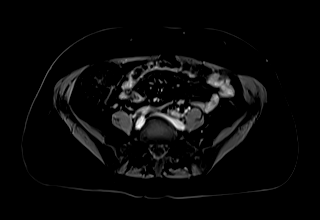
[im 24/72]
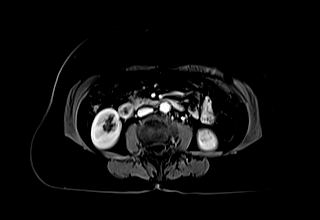
[im 48/72]
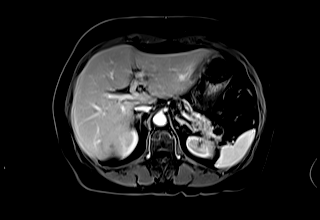
[im 72/72]
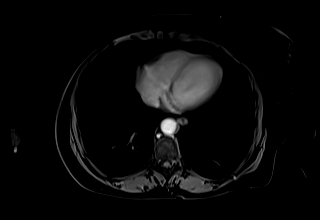

[Series 20: T1 dynamic · axial · 3.0mm · 1.25mm/px · z∈[-91,+122]mm · 4 of 72 slices shown (5 of 7)]
[im 1/72]
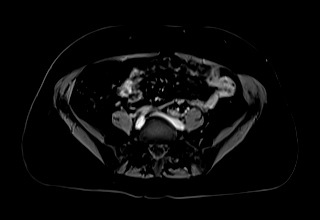
[im 24/72]
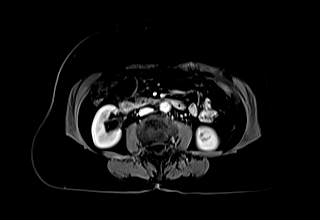
[im 48/72]
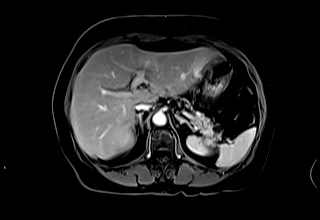
[im 72/72]
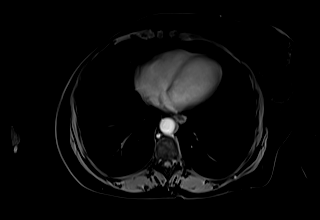

[Series 22: T1 dynamic · coronal · 3.0mm · 1.25mm/px · 4 of 72 slices shown (6 of 7)]
[im 1/72]
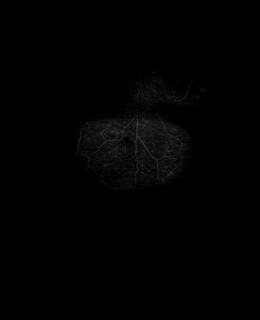
[im 24/72]
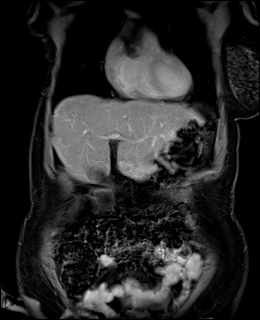
[im 48/72]
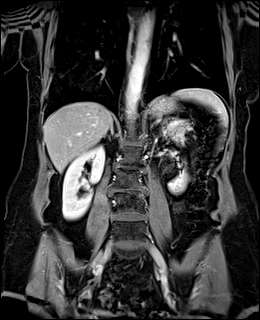
[im 72/72]
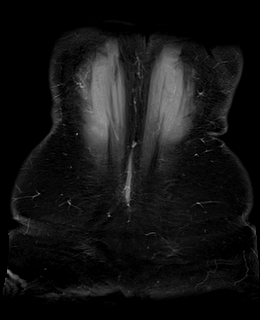

[Series 23: T2 · axial · 6.0mm · 1.56mm/px · z∈[-99,+125]mm · 2 of 32 slices shown (2 of 2)]
[im 1/32]
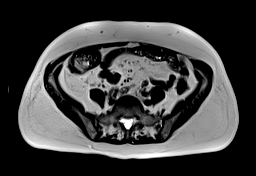
[im 32/32]
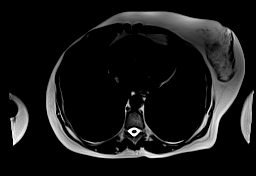

[Series 25: T1 dynamic · axial · 3.0mm · 1.25mm/px · z∈[-91,+122]mm · 4 of 72 slices shown (7 of 7)]
[im 1/72]
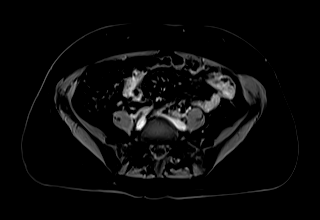
[im 24/72]
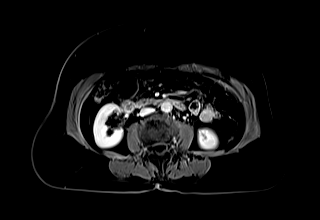
[im 48/72]
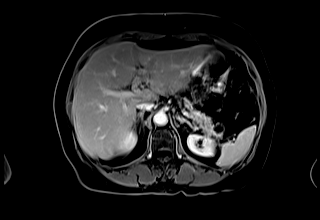
[im 72/72]
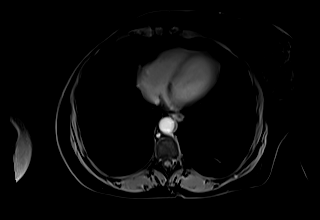

[44 of 48 positions shown; findings below may reference images not displayed]

FINDINGS: Lower chest: No acute findings.

Hepatobiliary: No suspicious mass or other parenchymal abnormality
identified. Small layering gallstone evident. No intrahepatic or
extrahepatic biliary dilation.

Pancreas: Several tiny cystic foci are identified in the body and
tail of pancreas. Dominant lesion is 15 mm in the tail of pancreas,
similar to minimally decreased in the interval with no evidence for
internal architecture or mural nodularity. No suspicious enhancing
pancreatic lesion. No main duct dilatation.

Spleen:  No splenomegaly. No focal mass lesion.

Adrenals/Urinary Tract: No adrenal nodule or mass. Tiny cyst noted
in the right kidney with central sinus cysts noted bilaterally.

Stomach/Bowel: Stomach is unremarkable. No gastric wall thickening.
No evidence of outlet obstruction. Duodenum is normally positioned
as is the ligament of Treitz. No small bowel or colonic dilatation
within the visualized abdomen. Diverticular changes are noted
diffusely within the visualized colon.

Vascular/Lymphatic: No abdominal aortic aneurysm no abdominal
lymphadenopathy

Other:  No intraperitoneal free fluid.

Musculoskeletal: No focal suspicious marrow enhancement within the
visualized bony anatomy.
IMPRESSION: 1. Several tiny cystic foci in the body and tail of pancreas,
similar to minimally decreased in size from prior study with
dominant lesion measuring 15 mm today. Repeat MRI in 6 months
recommended to ensure stability. This recommendation follows ACR
consensus guidelines: Management of Incidental Pancreatic Cysts: A
White Paper of the ACR Incidental Findings Committee. [HOSPITAL] [MX];[DATE].
2. Right hydroureteronephrosis with perinephric edema seen
previously has resolved in the interval.
3. Cholelithiasis. No biliary dilation.
4. Colonic diverticulosis.

## 2020-12-02 MED ORDER — GADOBUTROL 1 MMOL/ML IV SOLN
6.0000 mL | Freq: Once | INTRAVENOUS | Status: AC | PRN
  Administered 2020-12-02: 6 mL via INTRAVENOUS

## 2020-12-04 ENCOUNTER — Telehealth: Payer: Self-pay | Admitting: Gastroenterology

## 2020-12-04 NOTE — Telephone Encounter (Signed)
Spoke to pt in the office she will cb to sch appt

## 2020-12-04 NOTE — Telephone Encounter (Signed)
Called and spoke to patient.  Let her know that Dr. Havery Moros does not prescribe gabapentin for her.  She apologized and realized it was a different doctor. She will call them.

## 2020-12-04 NOTE — Telephone Encounter (Signed)
Pt called and stated that she takes Gabapentin. She wanted to know how to take this medication. Please give her a call. Thank you.

## 2020-12-12 ENCOUNTER — Telehealth: Payer: Self-pay | Admitting: Cardiovascular Disease

## 2020-12-12 NOTE — Telephone Encounter (Signed)
Pt was seen by her PCP Jacolyn Reedy MD and was prescribed Atorvastatin 40mg  on 11/15/20, pt would like to know if she should be taking it. Please advise

## 2020-12-12 NOTE — Telephone Encounter (Signed)
Spoke with pt, she wants to know what dr Claiborne Billings thinks about being on a statin due to her calcium score being 0. When they checked her lipids the first of may her LDL was 197. Aware dr Claiborne Billings is on vacation so there will be a delay in response but will forward for dr Claiborne Billings to advise.

## 2020-12-15 ENCOUNTER — Ambulatory Visit: Payer: Medicare Other | Admitting: Adult Health

## 2020-12-15 ENCOUNTER — Telehealth: Payer: Self-pay | Admitting: Gastroenterology

## 2020-12-15 NOTE — Telephone Encounter (Signed)
Opened in ERROR

## 2020-12-27 NOTE — Telephone Encounter (Signed)
Spoke to patient-discussed recommendations.   Patient continues to be hesitant to start medication.   She would like Dr. Claiborne Billings to provide his feedback and if this is the medication/dosage he would recommend.      PCP recommended atorvastatin (Lipitor) 40 mg daily. LDL 11/13/20 197.

## 2020-12-27 NOTE — Telephone Encounter (Signed)
Calcium score if ) and keeping LDL under control will assure the calcium score remains low.   Not taking statin and letting LDL high , can lead to plaque build up leading to CAD, PAD, or stroke.

## 2020-12-27 NOTE — Telephone Encounter (Signed)
Patient calling back to follow up.

## 2020-12-28 ENCOUNTER — Encounter: Payer: Medicare Other | Admitting: Gastroenterology

## 2020-12-29 DIAGNOSIS — B351 Tinea unguium: Secondary | ICD-10-CM | POA: Diagnosis not present

## 2020-12-29 DIAGNOSIS — N8111 Cystocele, midline: Secondary | ICD-10-CM | POA: Diagnosis not present

## 2020-12-29 DIAGNOSIS — G609 Hereditary and idiopathic neuropathy, unspecified: Secondary | ICD-10-CM | POA: Diagnosis not present

## 2020-12-29 DIAGNOSIS — M629 Disorder of muscle, unspecified: Secondary | ICD-10-CM | POA: Diagnosis not present

## 2020-12-29 HISTORY — DX: Tinea unguium: B35.1

## 2021-01-05 ENCOUNTER — Telehealth: Payer: Self-pay | Admitting: Gastroenterology

## 2021-01-05 NOTE — Telephone Encounter (Signed)
Follow up:     Patient calling to speak with some concering her taking this medication. Patient would loike for Dr. Claiborne Billings to let her know what she need to do.

## 2021-01-05 NOTE — Telephone Encounter (Signed)
She will be lying on her left side, I don't think should interfere with anything if you can call her to make sure she will be comfortable in that position. Thanks

## 2021-01-05 NOTE — Telephone Encounter (Signed)
Inbound call from patient. She have a procedure scheduled 6/28. Wanted it to be known her cervical wall that bladder lays on stretches and drops and wanted to make sure it does not interfere with procedure. Best contact number [phone]

## 2021-01-05 NOTE — Telephone Encounter (Signed)
Spoke with patient, she is aware and OK to proceed. Patient will pick up prep from pharmacy tomorrow. Patient verbalized understanding and had no concerns at the end of the call.

## 2021-01-05 NOTE — Telephone Encounter (Signed)
Pt states that she was told by her PCP want her to take. She states that she does not want to take until Dr Claiborne Billings tells her that this is what he would have had her start. She states that she has been waiting patiently and Dr Claiborne Billings has been back for a t least a week and she has not heard back from him. I relayed again the message from [facility]. Pt states that "this is good but I want to hear this from Dr Claiborne Billings". She states that her Calcium score is 0 then why does she need to take any medication at all? She is trying to change this with her diet and she STILL want to hear this answer from Dr Claiborne Billings. Please advise.

## 2021-01-08 ENCOUNTER — Telehealth: Payer: Self-pay | Admitting: Gastroenterology

## 2021-01-08 NOTE — Telephone Encounter (Signed)
Called and spoke to patient. All questions answered regarding Plenvu instruction.

## 2021-01-08 NOTE — Telephone Encounter (Signed)
Patient is calling to over the Plenvu prep medication and the possibility of cramping.

## 2021-01-08 NOTE — Telephone Encounter (Signed)
Patient called with nausea/vomiting after starting bowel prep. She drank the majority of her Plenvu without issue, then started drinking water approx 45 mins later with subsequent nausea/vomiting. Feels ok now, but not sure she can tolerate 2nd dose of prep tomorrow AM. We discussed options to include:  1) Drink fluids now to see if she can tolerate and to resume prep. If tolerating, to continue with dose #2 as scheduled in the AM 2) Miralax prep now in leiu of Plenvu dose #1 which may have been vomited out 3) Cancel prep and reschedule colonoscopy to later date with alternate prep and antiemetics.   Patient does not want to go out this evening to try to find Miralax prep, anti-emetics, etc. She will try liquids and call us in the AM to update if she is able to tolerate or if she needs to reschedule.

## 2021-01-08 NOTE — Telephone Encounter (Signed)
With her LDL greater than 190 and current age, it would be worthwhile to initiate lipid-lowering therapy.  Her calcium score is 0 which is excellent.  If she is uncertain about tolerability she can initially try cutting it in half for several weeks and then try the 40 mg tablet.

## 2021-01-08 NOTE — Telephone Encounter (Signed)
Pt informed of providers result & recommendations. Pt verbalized understanding. No further questions . She will start at 20mg  then increase to the 40mg .

## 2021-01-09 ENCOUNTER — Encounter: Payer: Self-pay | Admitting: Gastroenterology

## 2021-01-09 ENCOUNTER — Ambulatory Visit (AMBULATORY_SURGERY_CENTER): Payer: Medicare Other | Admitting: Gastroenterology

## 2021-01-09 ENCOUNTER — Other Ambulatory Visit: Payer: Self-pay

## 2021-01-09 VITALS — BP 105/62 | HR 54 | Temp 97.6°F | Resp 16 | Ht 64.0 in | Wt 130.0 lb

## 2021-01-09 DIAGNOSIS — D122 Benign neoplasm of ascending colon: Secondary | ICD-10-CM

## 2021-01-09 DIAGNOSIS — Z86010 Personal history of colonic polyps: Secondary | ICD-10-CM

## 2021-01-09 DIAGNOSIS — K635 Polyp of colon: Secondary | ICD-10-CM | POA: Diagnosis not present

## 2021-01-09 DIAGNOSIS — D123 Benign neoplasm of transverse colon: Secondary | ICD-10-CM

## 2021-01-09 MED ORDER — FLEET ENEMA 7-19 GM/118ML RE ENEM
1.0000 | ENEMA | Freq: Once | RECTAL | Status: AC
  Administered 2021-01-09: 1 via RECTAL

## 2021-01-09 MED ORDER — SODIUM CHLORIDE 0.9 % IV SOLN: 500.0000 mL | Freq: Once | INTRAVENOUS | Status: DC

## 2021-01-09 NOTE — Telephone Encounter (Signed)
Thank you Sarah, agree with your plan

## 2021-01-09 NOTE — Patient Instructions (Signed)
YOU HAD AN ENDOSCOPIC PROCEDURE TODAY AT THE [facility]:   Refer to the procedure report that was given to you for any specific questions about what was found during the examination.  If the procedure report does not answer your questions, please call your gastroenterologist to clarify.  If you requested that your care partner not be given the details of your procedure findings, then the procedure report has been included in a sealed envelope for you to review at your convenience later.  YOU SHOULD EXPECT: Some feelings of bloating in the abdomen. Passage of more gas than usual.  Walking can help get rid of the air that was put into your GI tract during the procedure and reduce the bloating. If you had a lower endoscopy (such as a colonoscopy or flexible sigmoidoscopy) you may notice spotting of blood in your stool or on the toilet paper. If you underwent a bowel prep for your procedure, you may not have a normal bowel movement for a few days.  Please Note:  You might notice some irritation and congestion in your nose or some drainage.  This is from the oxygen used during your procedure.  There is no need for concern and it should clear up in a day or so.  SYMPTOMS TO REPORT IMMEDIATELY:   Following lower endoscopy (colonoscopy or flexible sigmoidoscopy):  Excessive amounts of blood in the stool  Significant tenderness or worsening of abdominal pains  Swelling of the abdomen that is new, acute  Fever of 100F or higher  For urgent or emergent issues, a gastroenterologist can be reached at any hour by calling [phone]. Do not use MyChart messaging for urgent concerns.    DIET:  We do recommend a small meal at first, but then you may proceed to your regular diet.  Drink plenty of fluids but you should avoid alcoholic beverages for 24 hours.  ACTIVITY:  You should plan to take it easy for the rest of today and you should NOT DRIVE or use heavy machinery until tomorrow (because  of the sedation medicines used during the test).    FOLLOW UP: Our staff will call the number listed on your records 48-72 hours following your procedure to check on you and address any questions or concerns that you may have regarding the information given to you following your procedure. If we do not reach you, we will leave a message.  We will attempt to reach you two times.  During this call, we will ask if you have developed any symptoms of COVID 19. If you develop any symptoms (ie: fever, flu-like symptoms, shortness of breath, cough etc.) before then, please call [phone].  If you test positive for Covid 19 in the 2 weeks post procedure, please call and report this information to us.    If any biopsies were taken you will be contacted by phone or by letter within the next 1-3 weeks.  Please call us at [phone] if you have not heard about the biopsies in 3 weeks.    SIGNATURES/CONFIDENTIALITY: You and/or your care partner have signed paperwork which will be entered into your electronic medical record.  These signatures attest to the fact that that the information above on your After Visit Summary has been reviewed and is understood.  Full responsibility of the confidentiality of this discharge information lies with you and/or your care-partner. 

## 2021-01-09 NOTE — Telephone Encounter (Signed)
Cheyenne Sanders Monday contacted patient with follow up this AM, see other telephone note for details of that. Doing Miralax prep this AM

## 2021-01-09 NOTE — Telephone Encounter (Signed)
PT called back this morning stating that she did have some results from the first dose of Plenvu taken last night, cloudy brown liquid. She wants to try the Miralax prep this morning. Instructions given for 1/2 dose of Miralax with gatorade. Instructed the patient to call if she is unable to tolerate this. For colonoscopy at 1:30 today.

## 2021-01-09 NOTE — Progress Notes (Signed)
VS taken by C.W. 

## 2021-01-09 NOTE — Progress Notes (Signed)
Called to room to assist during endoscopic procedure.  Patient ID and intended procedure confirmed with present staff. Received instructions for my participation in the procedure from the performing physician.  

## 2021-01-09 NOTE — Op Note (Signed)
[facility] Patient Name: Cheyenne Sanders Procedure Date: 01/09/2021 1:16 PM MRN: [id number] Endoscopist: Remo Lipps P. Havery Moros , MD Age: 70 Referring MD:  Date of Birth: [birth year] Gender: Female Account #: [id number] Procedure:                Colonoscopy Indications:              High risk colon cancer surveillance: Personal                            history of colonic polyps (06/2015 - sessile                            serrated polyp) Medicines:                Monitored Anesthesia Care Procedure:                Pre-Anesthesia Assessment:                           - Prior to the procedure, a History and Physical                            was performed, and patient medications and                            allergies were reviewed. [name] tolerance of                            previous anesthesia was also reviewed. The risks                            and benefits of the procedure and the sedation                            options and risks were discussed with the patient.                            All questions were answered, and informed consent                            was obtained. Prior Anticoagulants: The patient has                            taken no previous anticoagulant or antiplatelet                            agents. ASA Grade Assessment: III - A patient with                            severe systemic disease. After reviewing the risks                            and benefits, the patient was deemed in  satisfactory condition to undergo the procedure.                           After obtaining informed consent, the colonoscope                            was passed under direct vision. Throughout the                            procedure, the patient's blood pressure, pulse, and                            oxygen saturations were monitored continuously. The                            Olympus PCF-H190DL (#[id number]) Colonoscope  was                            introduced through the anus and advanced to the the                            cecum, identified by appendiceal orifice and                            ileocecal valve. The colonoscopy was performed                            without difficulty. The patient tolerated the                            procedure well. The quality of the bowel                            preparation was adequate. The ileocecal valve,                            appendiceal orifice, and rectum were photographed. Scope In: 2:32:53 PM Scope Out: 2:57:05 PM Scope Withdrawal Time: 0 hours 16 minutes 38 seconds  Total Procedure Duration: 0 hours 24 minutes 12 seconds  Findings:                 The perianal and digital rectal examinations were                            normal.                           A 5 to 6 mm polyp was found in the ascending colon.                            The polyp was flat. The polyp was removed with a                            cold snare. Resection and retrieval were complete.  Two flat polyps were found in the transverse colon.                            The polyps were 4 to 5 mm in size. These polyps                            were removed with a cold snare. Resection and                            retrieval were complete.                           Many medium-mouthed diverticula were found in the                            entire colon. Severe diverticulosis in the left                            colon with luminal narrowing.                           The exam was otherwise without abnormality. Complications:            No immediate complications. Estimated blood loss:                            Minimal. Estimated Blood Loss:     Estimated blood loss was minimal. Impression:               - One 5 to 6 mm polyp in the ascending colon,                            removed with a cold snare. Resected and retrieved.                            - Two 4 to 5 mm polyps in the transverse colon,                            removed with a cold snare. Resected and retrieved.                           - Diverticulosis in the entire examined colon.                           - The examination was otherwise normal. Recommendation:           - Patient has a contact number available for                            emergencies. The signs and symptoms of potential                            delayed complications were discussed with the  patient. Return to normal activities tomorrow.                            Written discharge instructions were provided to the                            patient.                           - Resume previous diet.                           - Continue present medications.                           - Await pathology results. Remo Lipps P. Havery Moros, MD 01/09/2021 3:01:28 PM This report has been signed electronically.

## 2021-01-09 NOTE — Telephone Encounter (Signed)
Pt called to inform that she was not able to do miralax as recommended because she did not have any at home. She did not go to the bathroom all night but she did this morning three times. She is not sure if she would tolerate second part of Prep and would like some advise on what to do. Pls call her.

## 2021-01-09 NOTE — Progress Notes (Signed)
PT taken to PACU. Monitors in place. VSS. Report given to RN. 

## 2021-01-11 ENCOUNTER — Telehealth: Payer: Self-pay

## 2021-01-11 NOTE — Telephone Encounter (Signed)
  Follow up Call-  Call back number 01/09/2021  Post procedure Call Back phone  # [phone]  Permission to leave phone message Yes  Some recent data might be hidden     Patient questions:  Do you have a fever, pain , or abdominal swelling? No. Pain Score  0 *  Have you tolerated food without any problems? Yes.    Have you been able to return to your normal activities? Yes.    Do you have any questions about your discharge instructions: Diet  No Medications  No. Follow up visit  No.  Do you have questions or concerns about your Care? No.  Actions: * If pain score is 4 or above: No action needed, pain <4.

## 2021-01-18 ENCOUNTER — Encounter: Payer: Self-pay | Admitting: Gastroenterology

## 2021-01-24 ENCOUNTER — Other Ambulatory Visit (HOSPITAL_BASED_OUTPATIENT_CLINIC_OR_DEPARTMENT_OTHER): Payer: Self-pay | Admitting: Nurse Practitioner

## 2021-01-27 DIAGNOSIS — U071 COVID-19: Secondary | ICD-10-CM | POA: Diagnosis not present

## 2021-02-06 ENCOUNTER — Ambulatory Visit (HOSPITAL_BASED_OUTPATIENT_CLINIC_OR_DEPARTMENT_OTHER): Payer: Medicare Other | Admitting: Nurse Practitioner

## 2021-02-07 ENCOUNTER — Encounter: Payer: Medicare Other | Admitting: Gastroenterology

## 2021-02-09 ENCOUNTER — Ambulatory Visit (INDEPENDENT_AMBULATORY_CARE_PROVIDER_SITE_OTHER): Payer: Medicare Other | Admitting: Nurse Practitioner

## 2021-02-09 ENCOUNTER — Encounter (HOSPITAL_BASED_OUTPATIENT_CLINIC_OR_DEPARTMENT_OTHER): Payer: Self-pay | Admitting: Nurse Practitioner

## 2021-02-09 ENCOUNTER — Other Ambulatory Visit: Payer: Self-pay

## 2021-02-09 VITALS — BP 118/76 | HR 56 | Ht 64.0 in | Wt 129.6 lb

## 2021-02-09 DIAGNOSIS — E7800 Pure hypercholesterolemia, unspecified: Secondary | ICD-10-CM

## 2021-02-09 DIAGNOSIS — N8110 Cystocele, unspecified: Secondary | ICD-10-CM

## 2021-02-09 DIAGNOSIS — M722 Plantar fascial fibromatosis: Secondary | ICD-10-CM | POA: Diagnosis not present

## 2021-02-09 DIAGNOSIS — I10 Essential (primary) hypertension: Secondary | ICD-10-CM

## 2021-02-09 MED ORDER — ATORVASTATIN CALCIUM 10 MG PO TABS: 10.0000 mg | ORAL_TABLET | Freq: Every day | ORAL | 1 refills | Status: DC

## 2021-02-09 NOTE — Assessment & Plan Note (Signed)
Discussed treatment options for prolapsed bladder including pelvic floor PT and pessary use.  Discussed that she may see either GYN or urology for this for further evaluation and recommendations, as either would be helpful in direction of plan She will call urology to see if Cheyenne Sanders is able to see her for this for recommendations.  Discussed symptoms to monitor that would indicate a need to be seen immediately, such as decreased urination or inability to void, pain, signs of infection, bleeding, or prolapse that cannot be manually replaced with ease. She expressed understanding.

## 2021-02-09 NOTE — Assessment & Plan Note (Signed)
BP well controlled today No red flags Continue medication as directed.  F/U in 3 months for labs

## 2021-02-09 NOTE — Progress Notes (Signed)
Established Patient Office Visit  Subjective:  Patient ID: Cheyenne Sanders, female    DOB: [birth year]  Age: 70 y.o. MRN: [id number]  CC:  Chief Complaint  Patient presents with   Follow-up    HPI Cheyenne Sanders presents for F/U on plantar fasciitis. She would also like to discuss bladder prolapse and cholesterol medications.   Plantar fasciitis History of incomplete tears to plantar fascia.  Has been icing foot, doing stretches, seen by ortho and foot specialists Reports pain is still present in heel and arch of foot Concerns with swelling in the foot Has been wrapping with ace bandage to help with stability Was told my one of the podiatrists to stop PT Not sure what else she needs to do Bladder Dropping Noticed uncomfortable sensation in perineum and noticed bulging Was seen by GYN and told she had bladder prolapse Requesting advice as to whether she should see GYN for this or Urology No pain, difficulty urinating, or prolapse present at this time Cholesterol Lipids elevated on recent labs Recommendation for atorvastatin '40mg'$  Reports that she is concerned about starting on a high dose and the effects that the medication may have Would like further information    Past Medical History:  Diagnosis Date   Breast cancer ([place]) [id number]   R breast cancer x 2; lumpectomy with radiation, chemotherapy; mastectomy R.   Colon polyps    Coronary artery disease involving native coronary artery of native heart without angina pectoris 06/28/2015   Overview:  underwent cardiac cath/PTCA in 1992 at age 23   12/1990-cardiac catheterization/PTCA 08/1991-cardiac catheterization 06/01/2003-treadmill dual-isotope stress test-normal perfusion, 15.8 METs 02/28/2004-coronary artery calcium score-1342.9 02/04/2005-treadmill dual-isotope stress test-normal perfusion, 13.5 METs 01/26/2007-stress echocardiogram-normal, 13 METs 04/27/2008-treadmill dual-isotop   Encounter for medical examination to  establish care 05/29/2020   Flank pain 09/06/2016   Hyperlipidemia    Hypertension    Nephrolithiasis    Neuromuscular disorder ([place])    Personal history of chemotherapy    Personal history of radiation therapy    Pneumonia    Skin infection 11/25/2017   Tick bite 11/25/2017    Past Surgical History:  Procedure Laterality Date   ABDOMINAL HYSTERECTOMY     BREAST BIOPSY Left 2016   BREAST LUMPECTOMY Right 1999   MASTECTOMY Right    2014   PARTIAL HYSTERECTOMY  1986   DUB; ovaries intact.    Family History  Problem Relation Age of Onset   Heart disease Mother    Colon cancer Maternal Aunt    Colon cancer Maternal Grandfather    Heart attack Maternal Grandfather        or ? stroke   Thyroid disease Son    Breast cancer Neg Hx    Esophageal cancer Neg Hx    Stomach cancer Neg Hx     Social History   Socioeconomic History   Marital status: Widowed    Spouse name: Not on file   Number of children: 2   Years of education: Not on file   Highest education level: Not on file  Occupational History   Occupation: retired  Tobacco Use   Smoking status: Never   Smokeless tobacco: Never  Vaping Use   Vaping Use: Never used  Substance and Sexual Activity   Alcohol use: Yes    Comment: rarely   Drug use: Never   Sexual activity: Not Currently    Partners: Male  Other Topics Concern   Not on file  Social History Narrative  Marital status: widowed x 2014; married x 11 years; not dating; not interested      Children:  2 sons; 4 grandchildren      Lives: alone; family in [place]      Employed: homemaker; previous work in [place]; 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      Tobacco: none      Alcohol: rare; social      Exercise: no formal exercise.        ADLs: independent with ADLs   Social Determinants of Health   Financial Resource Strain: Not on file  Food Insecurity: Not on file  Transportation Needs: Not on file  Physical Activity: Not on file  Stress: Not on  file  Social Connections: Not on file  Intimate Partner Violence: Not At Risk   Fear of Current or Ex-Partner: No   Emotionally Abused: No   Physically Abused: No   Sexually Abused: No    Outpatient Medications Prior to Visit  Medication Sig Dispense Refill   Cholecalciferol (VITAMIN D3) 25 MCG (1000 UT) CAPS Take 1 capsule (1,000 Units total) by mouth daily. 90 capsule 3   gabapentin (NEURONTIN) 100 MG capsule Take 1 to 3 tabs ('100mg'$  to '300mg'$ ) by mouth up to three times a day for nerve pain. Start with '100mg'$  and increase only  if needed. 120 capsule 3   metoprolol succinate (TOPROL-XL) 25 MG 24 hr tablet Take 1 tablet by mouth once daily 90 tablet 0   Facility-Administered Medications Prior to Visit  Medication Dose Route Frequency Provider Last Rate Last Admin   0.9 %  sodium chloride infusion  500 mL Intravenous Once Armbruster, Carlota Raspberry, MD        Allergies  Allergen Reactions   Sublimaze [Fentanyl] Shortness Of Breath    ROS Review of Systems All review of systems negative except what is listed in the HPI    Objective:    Physical Exam  BP 118/76   Pulse (!) 56   Ht '5\' 4"'$  (1.626 m)   Wt 129 lb 9.6 oz (58.8 kg)   SpO2 96%   BMI 22.25 kg/m  Wt Readings from Last 3 Encounters:  02/09/21 129 lb 9.6 oz (58.8 kg)  01/09/21 130 lb (59 kg)  11/30/20 132 lb 3.2 oz (60 kg)     Health Maintenance Due  Topic Date Due   TETANUS/TDAP  Never done   Zoster Vaccines- Shingrix (1 of 2) Never done   COVID-19 Vaccine (3 - Pfizer risk series) 01/07/2020    There are no preventive care reminders to display for this patient.  Lab Results  Component Value Date   TSH 3.110 11/13/2020   Lab Results  Component Value Date   WBC 6.5 11/13/2020   HGB 14.7 11/13/2020   HCT 44.4 11/13/2020   MCV 94.5 11/13/2020   PLT 271 11/13/2020   Lab Results  Component Value Date   NA 142 11/13/2020   K 4.2 11/13/2020   CO2 27 11/13/2020   GLUCOSE 87 11/13/2020   BUN 19 11/13/2020    CREATININE 0.65 11/13/2020   BILITOT 0.6 11/13/2020   ALKPHOS 51 11/13/2020   AST 22 11/13/2020   ALT 24 11/13/2020   PROT 6.7 11/13/2020   ALBUMIN 4.5 11/13/2020   CALCIUM 9.9 11/13/2020   ANIONGAP 10 11/13/2020   Lab Results  Component Value Date   CHOL 272 (H) 11/13/2020   Lab Results  Component Value Date   HDL 54 11/13/2020   Lab Results  Component Value Date   LDLCALC 197 (H) 11/13/2020   Lab Results  Component Value Date   TRIG 107 11/13/2020   Lab Results  Component Value Date   CHOLHDL 5.0 11/13/2020   Lab Results  Component Value Date   HGBA1C 5.5 06/17/2017      Assessment & Plan:   Problem List Items Addressed This Visit   None Visit Diagnoses     Pure hypercholesterolemia    -  Primary   Relevant Medications   atorvastatin (LIPITOR) 10 MG tablet       Meds ordered this encounter  Medications   atorvastatin (LIPITOR) 10 MG tablet    Sig: Take 1 tablet (10 mg total) by mouth daily.    Dispense:  90 tablet    Refill:  1    Follow-up: Return in about 3 months (around 05/12/2021) for Lipids.    Orma Render, NP

## 2021-02-09 NOTE — Assessment & Plan Note (Signed)
Review of labs with patient and the increased risks associated with elevated cholesterol levels.  Discussed that often times diet and activity are not enough to help normalize levels. Discussed the role of statin therapy in dyslipidemia and stressed the importance of treatment BEFORE there is plaque build-up to prevent concerns and complications.  Joint decision made to trial atorvastatin '10mg'$  (lowest dose) and see if this improves her cholesterol levels.  Will consider increasing dose if not completely effective.  F/U for eval and repeat labs in 3 months

## 2021-02-09 NOTE — Assessment & Plan Note (Signed)
Symptoms and presentation consistent with plantar fasciitis Discussed that these symptoms can continue on and off for some time Recommend continued PT movements at home and utilizing ibuprofen for pain Recommend heel cup for increased stability Wear well supportive sneakers with arch support at all times while on feet- avoid walking barefoot Continue to utilize wrap to hep with support as needed.  Recommend f/u with orthopedics for further evaluation if symptoms persist.

## 2021-02-09 NOTE — Patient Instructions (Addendum)
I recommend trying ibuprofen daily to get the inflammation down.  I would like you to ice the foot and ankle for 20 minutes at a time three times a day for at least one week.  I would do the physical therapy moves daily for at least 2 weeks.   I would like you to look for a heel cup at the pharmacy to wear inside your shoe to help with your pain and allow the fascia to calm down.   I would call [facility] office and see if she is able to evaluate the bladder for you.   I have sent in a '10mg'$  dose of the atorvastatin to start with- we will try to see if this dose helps with your cholesterol and check this in about 3 months.

## 2021-03-14 ENCOUNTER — Other Ambulatory Visit (HOSPITAL_COMMUNITY): Payer: Self-pay

## 2021-03-14 DIAGNOSIS — M79672 Pain in left foot: Secondary | ICD-10-CM | POA: Diagnosis not present

## 2021-03-14 DIAGNOSIS — G609 Hereditary and idiopathic neuropathy, unspecified: Secondary | ICD-10-CM | POA: Diagnosis not present

## 2021-03-14 IMAGING — CR DG ANKLE COMPLETE 3+V*L*
3 series · 3 of 3 positions shown · non-contrast
Comparison: None.

CLINICAL DATA: Ankle pain and swelling

EXAM:
LEFT ANKLE COMPLETE - 3+ VIEW

[x ankle ap left]
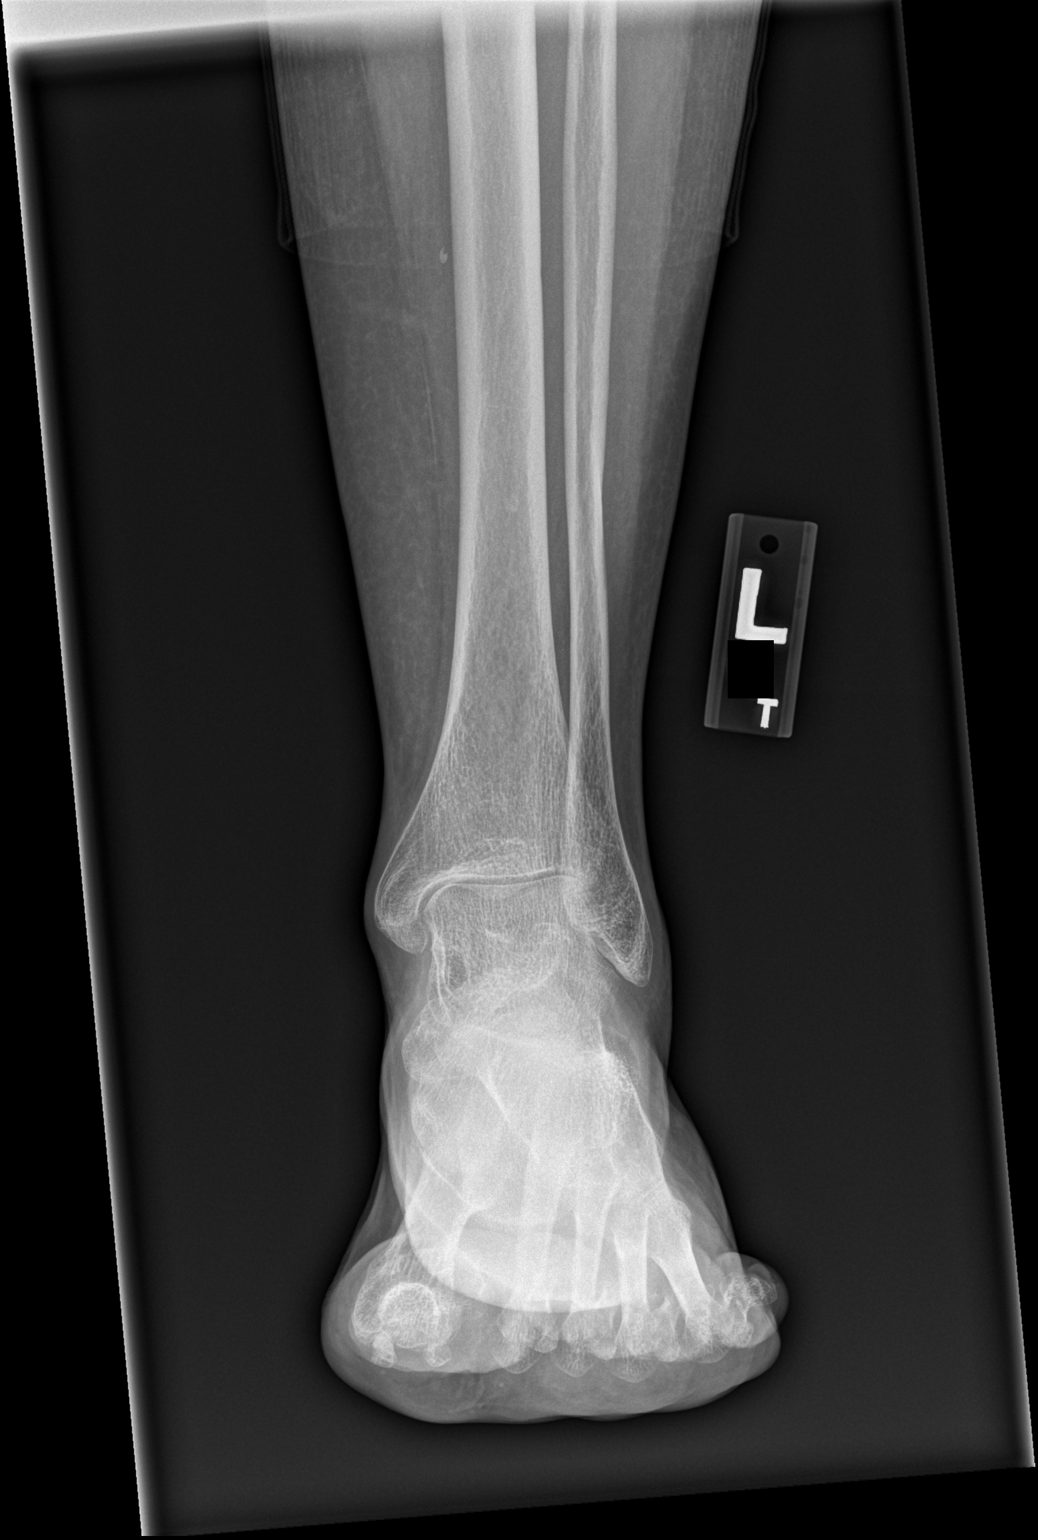

[x ankle obl left]
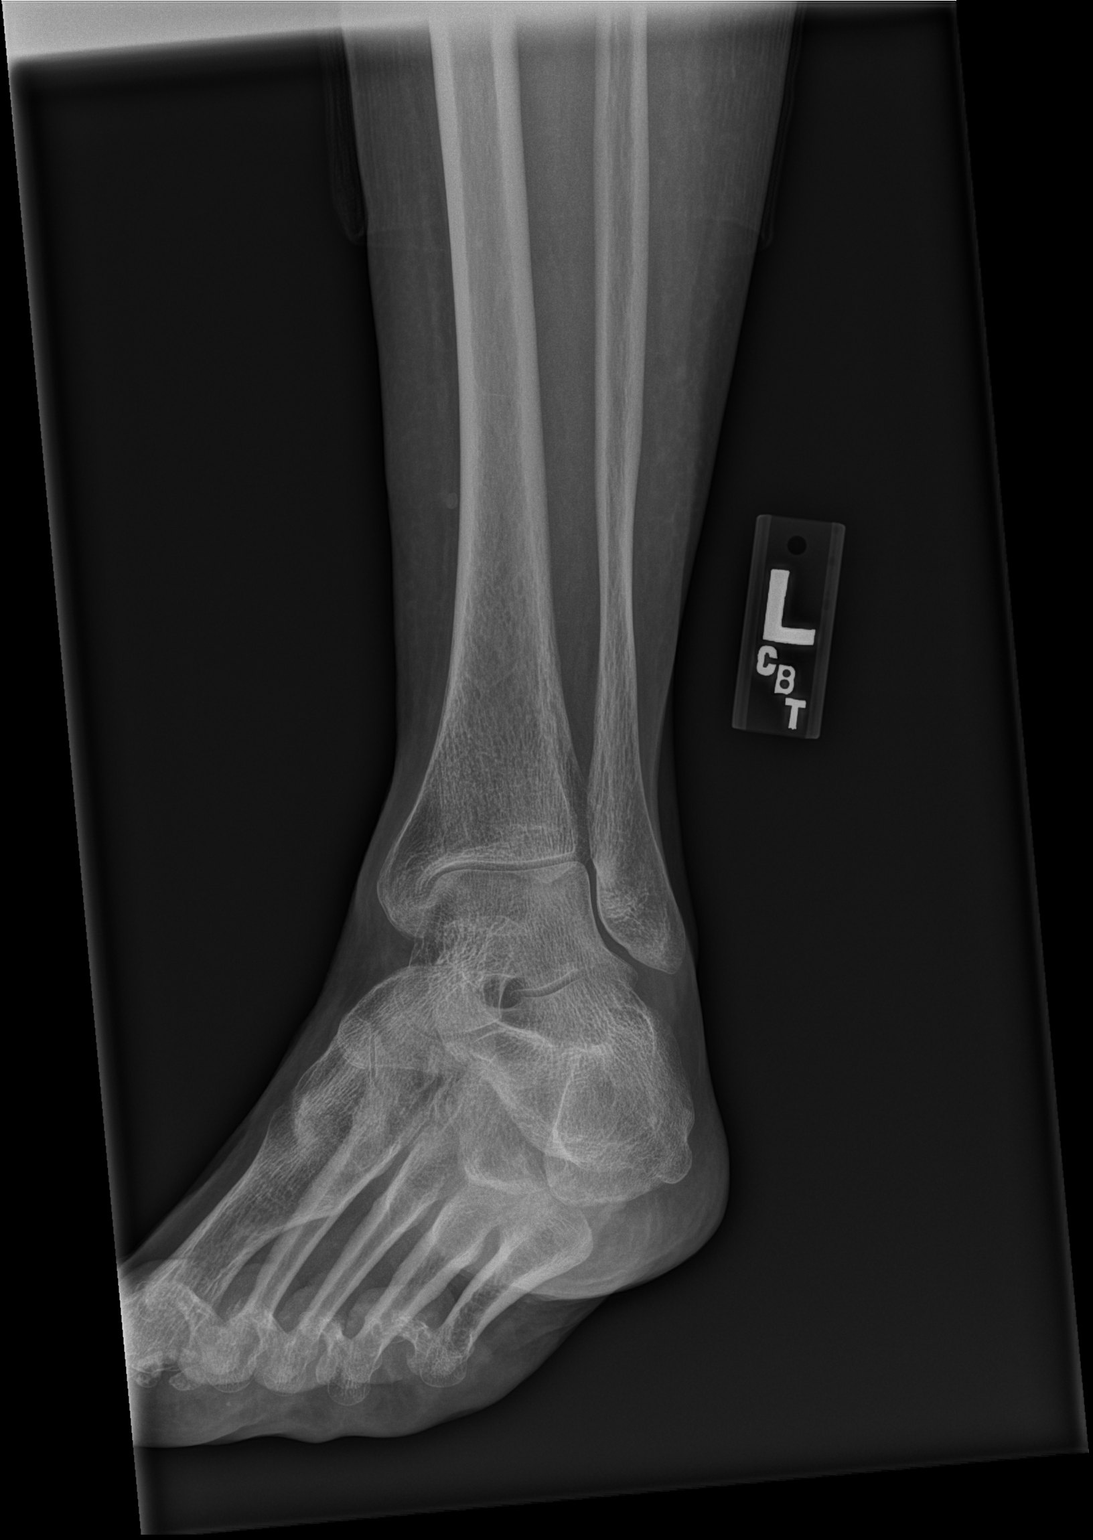

[x ankle lat left]
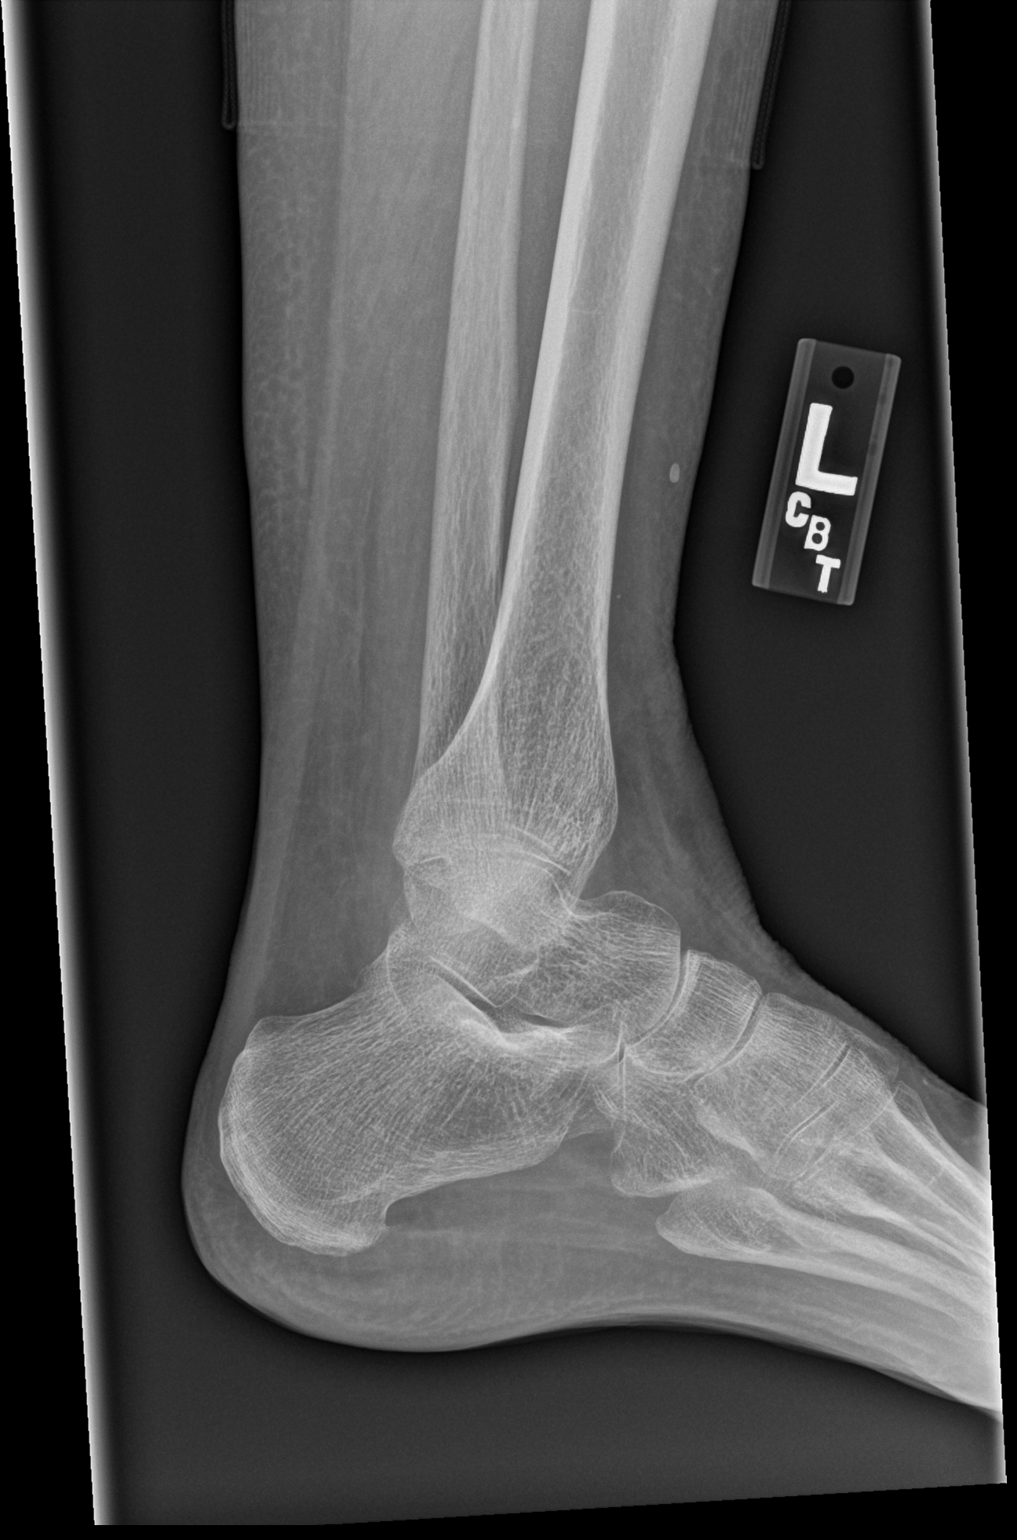

[3 of 3 positions shown; findings below may reference images not displayed]

FINDINGS: There is no evidence of fracture, dislocation, or joint effusion.
There is no evidence of arthropathy or other focal bone abnormality.
Soft tissues are unremarkable.
IMPRESSION: Negative.

## 2021-03-15 DIAGNOSIS — U071 COVID-19: Secondary | ICD-10-CM | POA: Diagnosis not present

## 2021-04-17 DIAGNOSIS — H2513 Age-related nuclear cataract, bilateral: Secondary | ICD-10-CM | POA: Diagnosis not present

## 2021-05-16 ENCOUNTER — Telehealth (HOSPITAL_BASED_OUTPATIENT_CLINIC_OR_DEPARTMENT_OTHER): Payer: Self-pay

## 2021-05-16 DIAGNOSIS — Z23 Encounter for immunization: Secondary | ICD-10-CM | POA: Diagnosis not present

## 2021-05-16 MED ORDER — METOPROLOL SUCCINATE ER 25 MG PO TB24: ORAL_TABLET | ORAL | 0 refills | Status: DC

## 2021-05-16 NOTE — Telephone Encounter (Signed)
Incoming fax request for medication refill Patient last seen in July, recommended 3 mo f/u Patient has no scheduled follow up. Will authorize a 30 day supply and advise patient to contact the office to arrange follow up

## 2021-05-22 DIAGNOSIS — N8110 Cystocele, unspecified: Secondary | ICD-10-CM | POA: Diagnosis not present

## 2021-05-22 DIAGNOSIS — R1084 Generalized abdominal pain: Secondary | ICD-10-CM | POA: Diagnosis not present

## 2021-05-22 DIAGNOSIS — N952 Postmenopausal atrophic vaginitis: Secondary | ICD-10-CM | POA: Diagnosis not present

## 2021-06-05 DIAGNOSIS — G6289 Other specified polyneuropathies: Secondary | ICD-10-CM | POA: Diagnosis not present

## 2021-06-12 ENCOUNTER — Other Ambulatory Visit: Payer: Self-pay

## 2021-06-12 ENCOUNTER — Ambulatory Visit (INDEPENDENT_AMBULATORY_CARE_PROVIDER_SITE_OTHER): Payer: Medicare Other | Admitting: Nurse Practitioner

## 2021-06-12 ENCOUNTER — Encounter (HOSPITAL_BASED_OUTPATIENT_CLINIC_OR_DEPARTMENT_OTHER): Payer: Self-pay | Admitting: Nurse Practitioner

## 2021-06-12 VITALS — BP 130/82 | HR 82 | Ht 64.0 in | Wt 134.4 lb

## 2021-06-12 DIAGNOSIS — I10 Essential (primary) hypertension: Secondary | ICD-10-CM

## 2021-06-12 DIAGNOSIS — E7489 Other specified disorders of carbohydrate metabolism: Secondary | ICD-10-CM

## 2021-06-12 DIAGNOSIS — E7800 Pure hypercholesterolemia, unspecified: Secondary | ICD-10-CM

## 2021-06-12 DIAGNOSIS — G609 Hereditary and idiopathic neuropathy, unspecified: Secondary | ICD-10-CM | POA: Diagnosis not present

## 2021-06-12 DIAGNOSIS — M79672 Pain in left foot: Secondary | ICD-10-CM | POA: Diagnosis not present

## 2021-06-12 MED ORDER — ATORVASTATIN CALCIUM 10 MG PO TABS: 10.0000 mg | ORAL_TABLET | Freq: Every day | ORAL | 3 refills | Status: DC

## 2021-06-12 MED ORDER — METOPROLOL SUCCINATE ER 25 MG PO TB24: ORAL_TABLET | ORAL | 3 refills | Status: DC

## 2021-06-12 NOTE — Assessment & Plan Note (Signed)
Peripheral neuropathy present bilaterally worse in the left foot. Review of records from neurology appointment reveals suspected possible demyelinating process could be contributing and neurology recommended labs for further evaluation. Discussed the labs that were ordered by neurology and explained that the reasoning behind these labs for patient. Reviewed previous labs performed in our office with patient and discussed labs that would likely not have a positive result based on labs performed here. Discussed with patient that it is her option to continue with the labs however I do recommend that she follow the advice of neurology for complete evaluation in order to determine the cause of the neuropathy which may also help identify treatable options. She plans to return to neurology for the lab work in the near future.

## 2021-06-12 NOTE — Patient Instructions (Signed)
I have printed the labs that I will need so you can have these done at the neurologist office if you would like.   You had a HIV test in 05/08/2020 and this was negative so this likely doesn't need repeating.

## 2021-06-12 NOTE — Progress Notes (Signed)
Established Patient Office Visit  Subjective:  Patient ID: Cheyenne Sanders, female    DOB: 1950-09-26  Age: 70 y.o. MRN: [id number]  CC:  Chief Complaint  Patient presents with   Medication Refill    Patient comes in today for blood pressure medication. When she called to get a refill she was advised to make a follow up visit. She seen neurologist last week, he wanted her to have lab work, but she wanted to see Laretta Bolster first. She is also interested and talking about the shingle vaccine.     HPI Cheyenne Sanders presents for follow-up for hypertension. She has not been taking her blood pressures at home but reports she is not having symptoms of chest pain, shortness of breath, dizziness, or edema. She has been taking her medication as prescribed and has not missed any doses. She denies any side effects from the medication.  She would like to discuss lab work that has been recommended by her neurologist for the foot pain she has been experiencing. She reports that she did have an electrical conduction study but she is unclear of what all it showed. She states that the neurologist has given her the option to have been labs done and she would like to go over these with me before making a decision to have them done. She reports that her pain has not changed significantly although she has been working with multiple modalities to try to find the cause and to eliminate the symptoms. She has been taking 100 mg of gabapentin at bedtime to help "knock the edge off" and allow her to sleep.  She reports that the pain is never completely gone but it does go away enough that she can rest okay.  Past Medical History:  Diagnosis Date   Breast cancer [facility]) [id number]   R breast cancer x 2; lumpectomy with radiation, chemotherapy; mastectomy R.   Sanders polyps    Coronary artery disease involving native coronary artery of native heart without angina pectoris 06/28/2015   Overview:  underwent cardiac  cath/PTCA in 1992 at age 40   12/1990-cardiac catheterization/PTCA 08/1991-cardiac catheterization 06/01/2003-treadmill dual-isotope stress test-normal perfusion, 15.8 METs 02/28/2004-coronary artery calcium score-1342.9 02/04/2005-treadmill dual-isotope stress test-normal perfusion, 13.5 METs 01/26/2007-stress echocardiogram-normal, 13 METs 04/27/2008-treadmill dual-isotop   Disorder of bone density and structure, unspecified  11/07/2020   Dysuria 11/07/2020   Encounter for medical examination to establish care 05/29/2020   Fatigue 11/07/2020   Flank pain 09/06/2016   Hyperlipidemia    Hypertension    Increased appetite 11/07/2020   Left wrist tendonitis 12/01/2020   Mixed hyperlipidemia 06/28/2015   Nephrolithiasis    Neuromuscular disorder ([place])    Nontraumatic tear of plantar fascia 09/02/2020   Pain in joint of right hip 03/02/2019   Personal history of chemotherapy    Personal history of radiation therapy    Plantar fasciitis 07/03/2020   Pneumonia    Precordial chest pain 06/28/2015   Skin infection 11/25/2017   Tick bite 11/25/2017   Weight gain 11/07/2020    Past Surgical History:  Procedure Laterality Date   ABDOMINAL HYSTERECTOMY     BREAST BIOPSY Left 2016   BREAST LUMPECTOMY Right 1999   MASTECTOMY Right    2014   PARTIAL HYSTERECTOMY  1986   DUB; ovaries intact.    Family History  Problem Relation Age of Onset   Heart disease Mother    Sanders cancer Maternal Aunt    Sanders cancer Maternal Grandfather  Heart attack Maternal Grandfather        or ? stroke   Thyroid disease Son    Breast cancer Neg Hx    Esophageal cancer Neg Hx    Stomach cancer Neg Hx     Social History   Socioeconomic History   Marital status: Widowed    Spouse name: Not on file   Number of children: 2   Years of education: Not on file   Highest education level: Not on file  Occupational History   Occupation: retired  Tobacco Use   Smoking status: Never   Smokeless tobacco: Never  Vaping  Use   Vaping Use: Never used  Substance and Sexual Activity   Alcohol use: Yes    Comment: rarely   Drug use: Never   Sexual activity: Not Currently    Partners: Male  Other Topics Concern   Not on file  Social History Narrative   Marital status: widowed x 2014; married x 11 years; not dating; not interested      Children:  2 sons; 4 grandchildren      Lives: alone; family in [place]      Employed: homemaker; previous work in [place]; 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      Tobacco: none      Alcohol: rare; social      Exercise: no formal exercise.        ADLs: independent with ADLs   Social Determinants of Health   Financial Resource Strain: Not on file  Food Insecurity: Not on file  Transportation Needs: Not on file  Physical Activity: Not on file  Stress: Not on file  Social Connections: Not on file  Intimate Partner Violence: Not At Risk   Fear of Current or Ex-Partner: No   Emotionally Abused: No   Physically Abused: No   Sexually Abused: No    Outpatient Medications Prior to Visit  Medication Sig Dispense Refill   Cholecalciferol (VITAMIN D3) 25 MCG (1000 UT) CAPS Take 1 capsule (1,000 Units total) by mouth daily. 90 capsule 3   gabapentin (NEURONTIN) 100 MG capsule Take 1 to 3 tabs (100mg  to 300mg ) by mouth up to three times a day for nerve pain. Start with 100mg  and increase only  if needed. 120 capsule 3   atorvastatin (LIPITOR) 10 MG tablet Take 1 tablet (10 mg total) by mouth daily. 90 tablet 1   metoprolol succinate (TOPROL-XL) 25 MG 24 hr tablet Take 1 tablet (25 mg total) by mouth daily. **DUE FOR FOLLOW UP. PLEASE CONTACT THE OFFICE TO ARRANGE. [phone]** 30 tablet 0   Facility-Administered Medications Prior to Visit  Medication Dose Route Frequency Provider Last Rate Last Admin   0.9 %  sodium chloride infusion  500 mL Intravenous Once Armbruster, Carlota Raspberry, MD        Allergies  Allergen Reactions   Sublimaze [Fentanyl] Shortness Of Breath     ROS Review of Systems All review of systems negative except what is listed in the HPI    Objective:    Physical Exam Vitals and nursing note reviewed.  Constitutional:      Appearance: Normal appearance.  HENT:     Head: Normocephalic.  Eyes:     Extraocular Movements: Extraocular movements intact.     Conjunctiva/sclera: Conjunctivae normal.     Pupils: Pupils are equal, round, and reactive to light.  Neck:     Vascular: No carotid bruit.  Cardiovascular:     Rate and Rhythm: Normal rate  and regular rhythm.     Pulses: Normal pulses.     Heart sounds: Normal heart sounds.  Pulmonary:     Effort: Pulmonary effort is normal.     Breath sounds: Normal breath sounds.  Abdominal:     General: Bowel sounds are normal.     Palpations: Abdomen is soft.  Musculoskeletal:        General: Swelling and tenderness present.     Cervical back: Normal range of motion.     Right lower leg: No edema.     Left lower leg: No edema.     Comments: Mild swelling to the left lateral malleolus with no signs of injury present.  Soft, mobile, freely movable mass present consistent with lipoma.  Lymphadenopathy:     Cervical: No cervical adenopathy.  Skin:    Capillary Refill: Capillary refill takes less than 2 seconds.  Neurological:     General: No focal deficit present.     Mental Status: She is alert and oriented to person, place, and time.     Sensory: No sensory deficit.     Gait: Gait abnormal.  Psychiatric:        Mood and Affect: Mood normal.        Behavior: Behavior normal.        Thought Content: Thought content normal.        Judgment: Judgment normal.    BP 130/82   Pulse 82   Ht 5\' 4"  (1.626 m)   Wt 134 lb 6.4 oz (61 kg)   SpO2 97%   BMI 23.07 kg/m  Wt Readings from Last 3 Encounters:  06/12/21 134 lb 6.4 oz (61 kg)  02/09/21 129 lb 9.6 oz (58.8 kg)  01/09/21 130 lb (59 kg)     Health Maintenance Due  Topic Date Due   Hepatitis C Screening  Never done    TETANUS/TDAP  Never done   Zoster Vaccines- Shingrix (1 of 2) Never done   DEXA SCAN  01/30/2018   COVID-19 Vaccine (3 - Pfizer risk series) 01/07/2020   MAMMOGRAM  04/06/2021    There are no preventive care reminders to display for this patient.  Lab Results  Component Value Date   TSH 3.110 11/13/2020   Lab Results  Component Value Date   WBC 6.5 11/13/2020   HGB 14.7 11/13/2020   HCT 44.4 11/13/2020   MCV 94.5 11/13/2020   PLT 271 11/13/2020   Lab Results  Component Value Date   NA 142 11/13/2020   K 4.2 11/13/2020   CO2 27 11/13/2020   GLUCOSE 87 11/13/2020   BUN 19 11/13/2020   CREATININE 0.65 11/13/2020   BILITOT 0.6 11/13/2020   ALKPHOS 51 11/13/2020   AST 22 11/13/2020   ALT 24 11/13/2020   PROT 6.7 11/13/2020   ALBUMIN 4.5 11/13/2020   CALCIUM 9.9 11/13/2020   ANIONGAP 10 11/13/2020   Lab Results  Component Value Date   CHOL 272 (H) 11/13/2020   Lab Results  Component Value Date   HDL 54 11/13/2020   Lab Results  Component Value Date   LDLCALC 197 (H) 11/13/2020   Lab Results  Component Value Date   TRIG 107 11/13/2020   Lab Results  Component Value Date   CHOLHDL 5.0 11/13/2020   Lab Results  Component Value Date   HGBA1C 5.5 06/17/2017      Assessment & Plan:   Problem List Items Addressed This Visit     Primary hypertension  Blood pressures are slightly elevated today however patient is not having any symptoms. Recommend continue metoprolol 25 mg daily. Recommend monitoring blood pressure at home with goal blood pressures less than 130/80. Labs ordered to be obtained when she returns to neurology for lab work.      Relevant Medications   atorvastatin (LIPITOR) 10 MG tablet   metoprolol succinate (TOPROL-XL) 25 MG 24 hr tablet   Other Relevant Orders   COMPLETE METABOLIC PANEL WITH GFR   Lipid panel   Hereditary and idiopathic peripheral neuropathy    Peripheral neuropathy present bilaterally worse in the left  foot. Review of records from neurology appointment reveals suspected possible demyelinating process could be contributing and neurology recommended labs for further evaluation. Discussed the labs that were ordered by neurology and explained that the reasoning behind these labs for patient. Reviewed previous labs performed in our office with patient and discussed labs that would likely not have a positive result based on labs performed here. Discussed with patient that it is her option to continue with the labs however I do recommend that she follow the advice of neurology for complete evaluation in order to determine the cause of the neuropathy which may also help identify treatable options. She plans to return to neurology for the lab work in the near future.      Pure hypercholesterolemia   Relevant Medications   atorvastatin (LIPITOR) 10 MG tablet   metoprolol succinate (TOPROL-XL) 25 MG 24 hr tablet   Other Relevant Orders   COMPLETE METABOLIC PANEL WITH GFR   Lipid panel   Other Visit Diagnoses     Idiopathic peripheral neuropathy    -  Primary   Left foot pain       Other specified disorders of carbohydrate metabolism (HCC)           Meds ordered this encounter  Medications   atorvastatin (LIPITOR) 10 MG tablet    Sig: Take 1 tablet (10 mg total) by mouth daily.    Dispense:  90 tablet    Refill:  3   metoprolol succinate (TOPROL-XL) 25 MG 24 hr tablet    Sig: Take 1 tablet (25 mg total) by mouth daily.    Dispense:  90 tablet    Refill:  3    Follow-up: Return in about 1 year (around 06/12/2022).   Time: 50 minutes, >50% spent counseling, care coordination, chart review, and documentation.    Orma Render, NP

## 2021-06-12 NOTE — Assessment & Plan Note (Signed)
Blood pressures are slightly elevated today however patient is not having any symptoms. Recommend continue metoprolol 25 mg daily. Recommend monitoring blood pressure at home with goal blood pressures less than 130/80. Labs ordered to be obtained when she returns to neurology for lab work.

## 2021-06-14 ENCOUNTER — Ambulatory Visit (INDEPENDENT_AMBULATORY_CARE_PROVIDER_SITE_OTHER): Payer: Medicare Other | Admitting: Nurse Practitioner

## 2021-06-14 DIAGNOSIS — G609 Hereditary and idiopathic neuropathy, unspecified: Secondary | ICD-10-CM

## 2021-06-14 DIAGNOSIS — E7800 Pure hypercholesterolemia, unspecified: Secondary | ICD-10-CM | POA: Diagnosis not present

## 2021-06-14 DIAGNOSIS — I10 Essential (primary) hypertension: Secondary | ICD-10-CM

## 2021-06-14 NOTE — Progress Notes (Signed)
   Established Patient Nurse Visit  ------------------------------------------------------------------------------------------  Patient presents in office today for Labs from her Neurologist DR. Tonuzi Lab requisition printed and given to [employer] Patient labs drawn by [employer] will need to be rerouted to him for advisement  [facility] Neurology - [facility]

## 2021-06-19 LAB — VITAMIN B12

## 2021-06-19 LAB — MAG IGM ANTIBODIES: MAG IgM Antibodies: <900 BTU (ref 0–999)

## 2021-06-19 LAB — COMPREHENSIVE METABOLIC PANEL
ALT: 26 IU/L (ref 0–32)
AST: 28 IU/L (ref 0–40)
Albumin/Globulin Ratio: 2.4 — ABNORMAL HIGH (ref 1.2–2.2)
Albumin: 5 g/dL — ABNORMAL HIGH (ref 3.8–4.8)
Alkaline Phosphatase: 83 IU/L (ref 44–121)
BUN/Creatinine Ratio: 25 (ref 12–28)
BUN: 17 mg/dL (ref 8–27)
Bilirubin Total: 0.6 mg/dL (ref 0.0–1.2)
CO2: 19 mmol/L — ABNORMAL LOW (ref 20–29)
Calcium: 10.3 mg/dL (ref 8.7–10.3)
Chloride: 105 mmol/L (ref 96–106)
Creatinine, Ser: 0.69 mg/dL (ref 0.57–1.00)
Globulin, Total: 2.1 g/dL (ref 1.5–4.5)
Glucose: 79 mg/dL (ref 70–99)
Potassium: 5.5 mmol/L — ABNORMAL HIGH (ref 3.5–5.2)
Sodium: 148 mmol/L — ABNORMAL HIGH (ref 134–144)
Total Protein: 7.1 g/dL (ref 6.0–8.5)
eGFR: 94 mL/min/{1.73_m2} (ref >59)

## 2021-06-19 LAB — LIPID PANEL
Chol/HDL Ratio: 3.5 ratio (ref 0.0–4.4)
Cholesterol, Total: 184 mg/dL (ref 100–199)
HDL: 52 mg/dL (ref >39)
LDL Chol Calc (NIH): 108 mg/dL — ABNORMAL HIGH (ref 0–99)
Triglycerides: 136 mg/dL (ref 0–149)
VLDL Cholesterol Cal: 24 mg/dL (ref 5–40)

## 2021-06-19 LAB — PROTEIN ELECTROPHORESIS, SERUM

## 2021-06-19 LAB — FANA STAINING PATTERNS: Homogeneous Pattern: 1:80 {titer}

## 2021-06-19 LAB — HAV, HBV PANEL
Hep B Core Total Ab: NEGATIVE
Hep B Surface Ab, Qual: NONREACTIVE
Hepatitis B Surface Ag: NEGATIVE
hep A Total Ab: NEGATIVE

## 2021-06-19 LAB — ANTINUCLEAR ANTIBODIES, IFA: ANA Titer 1: POSITIVE — AB

## 2021-06-19 LAB — MAG INTERPRETATION REFLEXED

## 2021-06-27 ENCOUNTER — Ambulatory Visit (INDEPENDENT_AMBULATORY_CARE_PROVIDER_SITE_OTHER): Payer: Medicare Other | Admitting: Nurse Practitioner

## 2021-06-27 ENCOUNTER — Other Ambulatory Visit: Payer: Self-pay

## 2021-06-27 ENCOUNTER — Encounter (HOSPITAL_BASED_OUTPATIENT_CLINIC_OR_DEPARTMENT_OTHER): Payer: Self-pay | Admitting: Nurse Practitioner

## 2021-06-27 DIAGNOSIS — M25571 Pain in right ankle and joints of right foot: Secondary | ICD-10-CM

## 2021-06-27 DIAGNOSIS — M25579 Pain in unspecified ankle and joints of unspecified foot: Secondary | ICD-10-CM | POA: Diagnosis not present

## 2021-06-27 DIAGNOSIS — M79672 Pain in left foot: Secondary | ICD-10-CM | POA: Diagnosis not present

## 2021-06-27 DIAGNOSIS — G609 Hereditary and idiopathic neuropathy, unspecified: Secondary | ICD-10-CM | POA: Diagnosis not present

## 2021-06-27 DIAGNOSIS — E7489 Other specified disorders of carbohydrate metabolism: Secondary | ICD-10-CM | POA: Diagnosis not present

## 2021-06-27 HISTORY — DX: Pain in right ankle and joints of right foot: M25.571

## 2021-06-27 MED ORDER — DICLOFENAC SODIUM 1 % EX GEL: 2.0000 g | Freq: Four times a day (QID) | CUTANEOUS | 6 refills | Status: DC

## 2021-06-27 NOTE — Assessment & Plan Note (Signed)
Left ankle pain of unknown etiology without injury.  ROM intact with no signs of crepitus. Pulses and temperature normal. Tenderness noted on medial distal calf along tendon consistent with suspected tendonitis.  Mild swelling noted on lateral malleolus which has been present before. Suspect this is communicating from the joint space.  Recommend stretching and voltaren gel to ankle. OK to use ace wrap if this helps.  Will get x-ray to evaluate for bony abnormality.

## 2021-06-27 NOTE — Patient Instructions (Signed)
Use the Voltaren gel 4 times a day for pain on the ankle.  You may also try the biofreee to see if this helps.  I sent the order for the x-ray to the [facility].   We will let you know if we need to do anything additional or if this is just a ligament.   Keep elevating the foot and use ice as needed. You may also wrap the ace wrap on it if it helps

## 2021-06-27 NOTE — Progress Notes (Signed)
Acute Office Visit  Subjective:    Patient ID: Cheyenne Sanders, female    DOB: 06/26/1951, 70 y.o.   MRN: [id number]  Chief Complaint  Patient presents with   Acute Visit    Patient comes in today for swollen and painful left ankle. She is not aware of an injury. She is aware that more labs need to be done. She describes her pain level at a 8. No BP taken as she is not able to get on the exam table due to the pain in her ankle.     HPI Patient is in today for ankle pain that started yesterday with severe medial pain in the right ankle that prevented her from walking comfortably. She endorses pain daily in the foot, but this was different. She denies any known injury or overuse of the ankle. She endorses swelling on the lateral malleolus, but no other edema or erythema present. Her neuropathic symptoms have not changed.   Past Medical History:  Diagnosis Date   Breast cancer [facility]) [id number]   R breast cancer x 2; lumpectomy with radiation, chemotherapy; mastectomy R.   Colon polyps    Coronary artery disease involving native coronary artery of native heart without angina pectoris 06/28/2015   Overview:  underwent cardiac cath/PTCA in 1992 at age 85   12/1990-cardiac catheterization/PTCA 08/1991-cardiac catheterization 06/01/2003-treadmill dual-isotope stress test-normal perfusion, 15.8 METs 02/28/2004-coronary artery calcium score-1342.9 02/04/2005-treadmill dual-isotope stress test-normal perfusion, 13.5 METs 01/26/2007-stress echocardiogram-normal, 13 METs 04/27/2008-treadmill dual-isotop   Disorder of bone density and structure, unspecified  11/07/2020   Dysuria 11/07/2020   Encounter for medical examination to establish care 05/29/2020   Fatigue 11/07/2020   Flank pain 09/06/2016   Hyperlipidemia    Hypertension    Increased appetite 11/07/2020   Left wrist tendonitis 12/01/2020   Mixed hyperlipidemia 06/28/2015   Nephrolithiasis    Neuromuscular disorder ([place])    Nontraumatic tear of  plantar fascia 09/02/2020   Pain in joint of right hip 03/02/2019   Personal history of chemotherapy    Personal history of radiation therapy    Plantar fasciitis 07/03/2020   Pneumonia    Precordial chest pain 06/28/2015   Skin infection 11/25/2017   Tick bite 11/25/2017   Weight gain 11/07/2020    Past Surgical History:  Procedure Laterality Date   ABDOMINAL HYSTERECTOMY     BREAST BIOPSY Left 2016   BREAST LUMPECTOMY Right 1999   MASTECTOMY Right    2014   PARTIAL HYSTERECTOMY  1986   DUB; ovaries intact.    Family History  Problem Relation Age of Onset   Heart disease Mother    Colon cancer Maternal Aunt    Colon cancer Maternal Grandfather    Heart attack Maternal Grandfather        or ? stroke   Thyroid disease Son    Breast cancer Neg Hx    Esophageal cancer Neg Hx    Stomach cancer Neg Hx     Social History   Socioeconomic History   Marital status: Widowed    Spouse name: Not on file   Number of children: 2   Years of education: Not on file   Highest education level: Not on file  Occupational History   Occupation: retired  Tobacco Use   Smoking status: Never   Smokeless tobacco: Never  Vaping Use   Vaping Use: Never used  Substance and Sexual Activity   Alcohol use: Yes    Comment: rarely   Drug use:  Never   Sexual activity: Not Currently    Partners: Male  Other Topics Concern   Not on file  Social History Narrative   Marital status: widowed x 2014; married x 11 years; not dating; not interested      Children:  2 sons; 4 grandchildren      Lives: alone; family in [place]      Employed: homemaker; previous work in [place]; 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      Tobacco: none      Alcohol: rare; social      Exercise: no formal exercise.        ADLs: independent with ADLs   Social Determinants of Health   Financial Resource Strain: Not on file  Food Insecurity: Not on file  Transportation Needs: Not on file  Physical Activity: Not on file   Stress: Not on file  Social Connections: Not on file  Intimate Partner Violence: Not At Risk   Fear of Current or Ex-Partner: No   Emotionally Abused: No   Physically Abused: No   Sexually Abused: No    Outpatient Medications Prior to Visit  Medication Sig Dispense Refill   atorvastatin (LIPITOR) 10 MG tablet Take 1 tablet (10 mg total) by mouth daily. 90 tablet 3   Cholecalciferol (VITAMIN D3) 25 MCG (1000 UT) CAPS Take 1 capsule (1,000 Units total) by mouth daily. 90 capsule 3   gabapentin (NEURONTIN) 100 MG capsule Take 1 to 3 tabs (132m to 3014m by mouth up to three times a day for nerve pain. Start with 10071mnd increase only  if needed. 120 capsule 3   metoprolol succinate (TOPROL-XL) 25 MG 24 hr tablet Take 1 tablet (25 mg total) by mouth daily. 90 tablet 3   Facility-Administered Medications Prior to Visit  Medication Dose Route Frequency Provider Last Rate Last Admin   0.9 %  sodium chloride infusion  500 mL Intravenous Once Armbruster, SteCarlota RaspberryD        Allergies  Allergen Reactions   Sublimaze [Fentanyl] Shortness Of Breath    Review of Systems     Objective:    Physical Exam Constitutional:      Appearance: Normal appearance.  Musculoskeletal:        General: Tenderness present. No deformity or signs of injury. Normal range of motion.     Right lower leg: No edema.     Left lower leg: No edema.     Right ankle: Normal.     Left ankle: Swelling present. No deformity, ecchymosis or lacerations. Tenderness present. Normal range of motion. Anterior drawer test negative. Normal pulse.       Legs:  Neurological:     Mental Status: She is alert.    There were no vitals taken for this visit. Wt Readings from Last 3 Encounters:  06/12/21 134 lb 6.4 oz (61 kg)  02/09/21 129 lb 9.6 oz (58.8 kg)  01/09/21 130 lb (59 kg)    Health Maintenance Due  Topic Date Due   Hepatitis C Screening  Never done   TETANUS/TDAP  Never done   Zoster Vaccines- Shingrix (1  of 2) Never done   DEXA SCAN  01/30/2018   COVID-19 Vaccine (3 - Pfizer risk series) 01/07/2020   MAMMOGRAM  04/06/2021    There are no preventive care reminders to display for this patient.   Lab Results  Component Value Date   TSH 3.110 11/13/2020   Lab Results  Component Value Date   WBC  6.5 11/13/2020   HGB 14.7 11/13/2020   HCT 44.4 11/13/2020   MCV 94.5 11/13/2020   PLT 271 11/13/2020   Lab Results  Component Value Date   NA 148 (H) 06/14/2021   K 5.5 (H) 06/14/2021   CO2 19 (L) 06/14/2021   GLUCOSE 79 06/14/2021   BUN 17 06/14/2021   CREATININE 0.69 06/14/2021   BILITOT 0.6 06/14/2021   ALKPHOS 83 06/14/2021   AST 28 06/14/2021   ALT 26 06/14/2021   PROT 7.1 06/14/2021   ALBUMIN 5.0 (H) 06/14/2021   CALCIUM 10.3 06/14/2021   ANIONGAP 10 11/13/2020   EGFR 94 06/14/2021   Lab Results  Component Value Date   CHOL 184 06/14/2021   Lab Results  Component Value Date   HDL 52 06/14/2021   Lab Results  Component Value Date   LDLCALC 108 (H) 06/14/2021   Lab Results  Component Value Date   TRIG 136 06/14/2021   Lab Results  Component Value Date   CHOLHDL 3.5 06/14/2021   Lab Results  Component Value Date   HGBA1C 5.5 06/17/2017       Assessment & Plan:   Problem List Items Addressed This Visit     Acute ankle pain - Primary    Left ankle pain of unknown etiology without injury.  ROM intact with no signs of crepitus. Pulses and temperature normal. Tenderness noted on medial distal calf along tendon consistent with suspected tendonitis.  Mild swelling noted on lateral malleolus which has been present before. Suspect this is communicating from the joint space.  Recommend stretching and voltaren gel to ankle. OK to use ace wrap if this helps.  Will get x-ray to evaluate for bony abnormality.       Relevant Medications   diclofenac Sodium (VOLTAREN) 1 % GEL   Other Relevant Orders   DG Ankle Complete Left     Meds ordered this encounter   Medications   diclofenac Sodium (VOLTAREN) 1 % GEL    Sig: Apply 2 g topically 4 (four) times daily.    Dispense:  50 g    Refill:  6    Time: 35 minutes, >50% spent counseling, care coordination, chart review, and documentation.   Orma Render, NP

## 2021-06-28 ENCOUNTER — Ambulatory Visit
Admission: RE | Admit: 2021-06-28 | Discharge: 2021-06-28 | Disposition: A | Discharge status: Home / Self Care | Payer: Medicare Other | Source: Ambulatory Visit | Attending: Nurse Practitioner | Admitting: Nurse Practitioner

## 2021-06-28 DIAGNOSIS — M7989 Other specified soft tissue disorders: Secondary | ICD-10-CM | POA: Diagnosis not present

## 2021-06-28 DIAGNOSIS — M25579 Pain in unspecified ankle and joints of unspecified foot: Secondary | ICD-10-CM

## 2021-08-01 ENCOUNTER — Telehealth: Payer: Self-pay | Admitting: Gastroenterology

## 2021-08-01 NOTE — Telephone Encounter (Signed)
Patient called states she is having GERD symptoms seeking advise on what OTC medications she can get.

## 2021-08-01 NOTE — Telephone Encounter (Signed)
Called patient.  She has some Pepcid AC.  I recommended she take that, or famotidine 20 mg, once to twice daily.  If that is not effective she can try omeprazole over-the-counter 30 minutes before a meal. She will try that and if it does not resolve her Sx she said she will call to schedule an appointment.

## 2021-08-06 DIAGNOSIS — B351 Tinea unguium: Secondary | ICD-10-CM | POA: Diagnosis not present

## 2021-08-06 DIAGNOSIS — G609 Hereditary and idiopathic neuropathy, unspecified: Secondary | ICD-10-CM | POA: Diagnosis not present

## 2021-08-06 DIAGNOSIS — M6702 Short Achilles tendon (acquired), left ankle: Secondary | ICD-10-CM | POA: Diagnosis not present

## 2021-08-17 DIAGNOSIS — Z20822 Contact with and (suspected) exposure to covid-19: Secondary | ICD-10-CM | POA: Diagnosis not present

## 2021-08-28 ENCOUNTER — Telehealth: Payer: Self-pay | Admitting: Gastroenterology

## 2021-08-28 NOTE — Telephone Encounter (Signed)
Patient called this morning stating she had a bout with reflux last night that kept her up. She is having pain in her back on the side.  She had taken two Pepcid and they really didn't help.  She basically had to sit up all night to get any relief.  Is there anything else she could take as the instructions on the Pepcid said to take no more than two per day.  Please call patient and advise.  Thank you.

## 2021-08-28 NOTE — Telephone Encounter (Signed)
Returned call to patient. Pt reports that she had been doing OK on Pepcid. She reports that she had some wild rice with herbs and spices for dinner, then took her Pepcid at 11 pm. Pt states that she had to sit up for the rest of the night. I told her that she should be following an anti-reflux diet, I reviewed this with her. Pt has not tried the Omeprazole. I told her to try Omeprazole 20 mg 30-60 minutes before breakfast and continue Pepcid at bedtime and see how she does. She has been advised to not eat within 3-4 hours of going to bed at night. She knows that if she does not see any improvement then we will need to schedule her a f/u appt. Pt verbalized understanding and had no concerns at the end of the call.

## 2021-08-30 NOTE — Telephone Encounter (Signed)
Pt returned call. She has been scheduled for an appt with Dr. Havery Moros on Tuesday, 09/04/21 at 8:10 am. Pt will arrive at our office by 8 am for check in. Pt verbalized understanding and had no concerns at the end of the call.

## 2021-08-30 NOTE — Telephone Encounter (Signed)
Lm on vm for patient to return call for sooner appt with Dr. Havery Moros.

## 2021-08-30 NOTE — Telephone Encounter (Signed)
Returned call to patient. Pt is concerned about taking Pepcid and Omeprazole. She states that she is not really having the pain anymore. I told her that she can discontinue the Pepcid at bedtime. Pt has been advised to try the Omeprazole 20 mg daily for the next week and see how she does. I told her that if her symptoms are well controlled on the Omeprazole daily then she can continue that. She know that she can take an additional Omeprazole if needed. Pt has been advised to keep appt as scheduled. Pt verbalized understanding and had no concerns at the end of the call.

## 2021-08-30 NOTE — Telephone Encounter (Signed)
Inbound call from patient would like a call back with questions about medication

## 2021-09-04 ENCOUNTER — Encounter: Payer: Self-pay | Admitting: Gastroenterology

## 2021-09-04 ENCOUNTER — Ambulatory Visit (INDEPENDENT_AMBULATORY_CARE_PROVIDER_SITE_OTHER): Payer: Medicare Other | Admitting: Gastroenterology

## 2021-09-04 VITALS — BP 132/82 | HR 59 | Ht 64.0 in | Wt 132.0 lb

## 2021-09-04 DIAGNOSIS — K219 Gastro-esophageal reflux disease without esophagitis: Secondary | ICD-10-CM

## 2021-09-04 DIAGNOSIS — K862 Cyst of pancreas: Secondary | ICD-10-CM | POA: Diagnosis not present

## 2021-09-04 DIAGNOSIS — Z86010 Personal history of colonic polyps: Secondary | ICD-10-CM | POA: Diagnosis not present

## 2021-09-04 MED ORDER — OMEPRAZOLE MAGNESIUM 20 MG PO TBEC
20.0000 mg | DELAYED_RELEASE_TABLET | Freq: Two times a day (BID) | ORAL | 2 refills | Status: DC
Start: 2021-09-04 — End: 2023-03-25

## 2021-09-04 MED ORDER — SUCRALFATE 1 G PO TABS: 1.0000 g | ORAL_TABLET | Freq: Four times a day (QID) | ORAL | 1 refills | Status: DC | PRN

## 2021-09-04 NOTE — Progress Notes (Signed)
HPI :  71 year old female here to follow-up for possible reflux, history of pancreatic cyst.  I last saw her in June 2022 for colonoscopy.  She is here today to discuss symptoms that been bothering her in her chest.  For the past month or so she has felt some burning in her epigastric area that can go up into her lower chest, pyrosis.  She states she had a really bad episode at night where the symptoms were in her chest, her epigastric area and felt in her back as well, burning sensation.  She was tried on Pepcid twice daily for 1 week which did not provide much relief.  For the past 7 days she has been on omeprazole 20 mg daily.  She states she has not had any severe episodes since being on the omeprazole but has not resolved her symptoms yet, she still feels some burning there.  She denies any dysphagia.  No nausea or vomiting.  She is questions if spicy foods can sometimes make her symptoms worse.  She denies any blood in her stools.  She has rare NSAID use.  Denies any weight loss.  Recall she had a history of atypical chest pain in the past with a negative cardiac evaluation in recent years.  She also has a history of pancreatic cyst.  Recall that she previously was admitted a few years ago for renal stones. She had CT scan imaging which showed a renal stone but also a pancreatic cyst, that led to a follow-up MRI as below. Her husband died of pancreatic cancer and she is very sensitive to this.  No family history otherwise of pancreatic cancer.   Her last MRCP was in May 2022 which showed stable size of the pancreatic cyst, in fact smaller than it was on previous imaging.  Work-up as outlined below  Prior evaluation: Echo 08/06/19 - normal LV function, mild MR and TR   CTA 09/29/19 - no evidence of CAD   CT abdomen / pelvis with contrast 05/08/20: IMPRESSION: 1. Questionable area of colonic wall thickening and pericolonic edema involving the redundant sigmoid colon. This may represent short  segment colitis or diverticulitis. No perforation. 2. Right pelvic 6 mm calcification, difficult to differentiate if this is within or adjacent to the right ureter or an adjacent phlebolith. There is mild right hydroureter and dilatation of the right renal collecting system without significant perinephric edema. There is also dilatation of the left renal pelvis suggesting underlying extrarenal pelvis configuration. 3. Additional nonobstructing right renal stone. 4. Low-density lesions in the liver, largest measuring 11 x 11 mm in the far left lobe of the liver, nonspecific. Given history of breast cancer, recommend further evaluation with hepatic protocol MRI. There is also a 12 mm low-density lesion in the pancreatic tail. Recommend further characterization with pancreatic protocol MRI on an elective basis. 5. Small hiatal hernia.     MRI abdomen 05/08/20 - IMPRESSION: 1. There are at least 3 nonenhancing liver lesions corresponding to the CT abnormality. No abnormal enhancement associated with these lesions which are favored to represent benign cysts. 2. Hepatic steatosis. 3. Multiple small scattered cysts within the pancreas. The largest is in the distal tail of pancreas measuring 1.7 cm. Interval surveillance imaging is recommended. Initial follow-up examination should be obtained in 6 months with repeat MRI without and with contrast material. This recommendation follows ACR consensus guidelines: Management of Incidental Pancreatic Cysts: A White Paper of the ACR Incidental Findings Committee. J Am Coll Radiol  5462;70:350-093. 4. Persistent right-sided hydronephrosis, hydroureter and perinephric fat stranding secondary to distal right ureteral calculus.   MRCP 12/04/20: IMPRESSION: 1. Several tiny cystic foci in the body and tail of pancreas, similar to minimally decreased in size from prior study with dominant lesion measuring 15 mm today.  2. Right hydroureteronephrosis  with perinephric edema seen previously has resolved in the interval. 3. Cholelithiasis. No biliary dilation. 4. Colonic diverticulosis.  Colonoscopy 01/09/21: The perianal and digital rectal examinations were normal. - A 5 to 6 mm polyp was found in the ascending colon. The polyp was flat. The polyp was removed with a cold snare. Resection and retrieval were complete. - Two flat polyps were found in the transverse colon. The polyps were 4 to 5 mm in size. These polyps were removed with a cold snare. Resection and retrieval were complete. - Many medium-mouthed diverticula were found in the entire colon. Severe diverticulosis in the left colon with luminal narrowing. - The exam was otherwise without abnormality.  Surgical [P], colon, transverse, ascending, polyp (3) - SESSILE SERRATED POLYP WITHOUT CYTOLOGIC DYSPLASIA (THREE).  Repeat in 5 years recommended    Past Medical History:  Diagnosis Date   Breast cancer [facility]) [id number]   R breast cancer x 2; lumpectomy with radiation, chemotherapy; mastectomy R.   Colon polyps    Coronary artery disease involving native coronary artery of native heart without angina pectoris 06/28/2015   Overview:  underwent cardiac cath/PTCA in 1992 at age 19   12/1990-cardiac catheterization/PTCA 08/1991-cardiac catheterization 06/01/2003-treadmill dual-isotope stress test-normal perfusion, 15.8 METs 02/28/2004-coronary artery calcium score-1342.9 02/04/2005-treadmill dual-isotope stress test-normal perfusion, 13.5 METs 01/26/2007-stress echocardiogram-normal, 13 METs 04/27/2008-treadmill dual-isotop   Disorder of bone density and structure, unspecified  11/07/2020   Dysuria 11/07/2020   Encounter for medical examination to establish care 05/29/2020   Fatigue 11/07/2020   Flank pain 09/06/2016   Hyperlipidemia    Hypertension    Increased appetite 11/07/2020   Left wrist tendonitis 12/01/2020   Mixed hyperlipidemia 06/28/2015   Nephrolithiasis    Neuromuscular  disorder ([place])    Nontraumatic tear of plantar fascia 09/02/2020   Pain in joint of right hip 03/02/2019   Personal history of chemotherapy    Personal history of radiation therapy    Plantar fasciitis 07/03/2020   Pneumonia    Precordial chest pain 06/28/2015   Skin infection 11/25/2017   Tick bite 11/25/2017   Weight gain 11/07/2020     Past Surgical History:  Procedure Laterality Date   ABDOMINAL HYSTERECTOMY     BREAST BIOPSY Left 2016   BREAST LUMPECTOMY Right 1999   MASTECTOMY Right    2014   PARTIAL HYSTERECTOMY  1986   DUB; ovaries intact.   Family History  Problem Relation Age of Onset   Heart disease Mother    Colon cancer Maternal Aunt    Colon cancer Maternal Grandfather    Heart attack Maternal Grandfather        or ? stroke   Thyroid disease Son    Breast cancer Neg Hx    Esophageal cancer Neg Hx    Stomach cancer Neg Hx    Social History   Tobacco Use   Smoking status: Never   Smokeless tobacco: Never  Vaping Use   Vaping Use: Never used  Substance Use Topics   Alcohol use: Yes    Comment: rarely   Drug use: Never   Current Outpatient Medications  Medication Sig Dispense Refill   atorvastatin (LIPITOR) 10 MG tablet Take  1 tablet (10 mg total) by mouth daily. 90 tablet 3   Cholecalciferol (VITAMIN D3) 25 MCG (1000 UT) CAPS Take 1 capsule (1,000 Units total) by mouth daily. 90 capsule 3   diclofenac Sodium (VOLTAREN) 1 % GEL Apply 2 g topically 4 (four) times daily. 50 g 6   metoprolol succinate (TOPROL-XL) 25 MG 24 hr tablet Take 1 tablet (25 mg total) by mouth daily. 90 tablet 3   omeprazole (PRILOSEC OTC) 20 MG tablet Take 20 mg by mouth daily.     Current Facility-Administered Medications  Medication Dose Route Frequency Provider Last Rate Last Admin   0.9 %  sodium chloride infusion  500 mL Intravenous Once Arnel Wymer, Carlota Raspberry, MD       Allergies  Allergen Reactions   Sublimaze [Fentanyl] Shortness Of Breath     Review of Systems: All  systems reviewed and negative except where noted in HPI.    Lab Results  Component Value Date   WBC 6.5 11/13/2020   HGB 14.7 11/13/2020   HCT 44.4 11/13/2020   MCV 94.5 11/13/2020   PLT 271 11/13/2020    Lab Results  Component Value Date   CREATININE 0.69 06/14/2021   BUN 17 06/14/2021   NA 148 (H) 06/14/2021   K 5.5 (H) 06/14/2021   CL 105 06/14/2021   CO2 19 (L) 06/14/2021    Lab Results  Component Value Date   ALT 26 06/14/2021   AST 28 06/14/2021   ALKPHOS 83 06/14/2021   BILITOT 0.6 06/14/2021     Physical Exam: BP 132/82    Pulse (!) 59    Ht 5\' 4"  (1.626 m)    Wt 132 lb (59.9 kg)    BMI 22.66 kg/m  Constitutional: Pleasant,well-developed, female in no acute distress. Abdominal: Soft, nondistended, nontender. There are no masses palpable.  Extremities: no edema. Neurological: Alert and oriented to person place and time. Skin: Skin is warm and dry. No rashes noted. Psychiatric: Normal mood and affect. Behavior is normal.   ASSESSMENT AND PLAN: 71 year old female here for reassessment of the following:  GERD Pancreatic cyst History of colon polyps  Symptoms based on her history today are very consistent with that of GERD.  She did not benefit from Pepcid and lower dose omeprazole seems to have helped prevent more severe episodes but has not resolved it.  Discussed options.  Recommending we increase her omeprazole to 20 mg twice daily for the next month and see if we can resolve her symptoms, then she can titrate down and use either as needed or the lowest dose needed to control symptoms.  We will also give her some Carafate to use as needed.  If however, her symptoms or not controlled on this regimen then we will need to consider an endoscopy.  We discussed what endoscopy is, risk benefits, she is agreeable to it if needed.  I asked her to touch base in a few weeks if she is not feeling better.  Otherwise we reviewed her pancreatic cyst.  This appears stable in  size over time.  We discussed variety of guidelines out there to manage pancreatic cysts, ideology guidelines versus ACG, AGA -based on GI Society guidelines I do not think she would warrant another MRCP for 1 year from her last exam and she is comfortable with that.  We will plan on an MRCP in May 2023.  She is otherwise due for her next colonoscopy in 5 years from her last exam.  Plan: -  omeprazole 20mg  BID for 1 month and then once daily thereafter and titrate down PRN - trial of Carafate 1 tablet every 6 hours PRN - if no improvement or worsening in upcoming weeks we will consider EGD - plan on repeating MRCP in May 2023 - surveillance colonoscopy to be done in 5 years  Jolly Mango, MD [facility]

## 2021-09-04 NOTE — Patient Instructions (Addendum)
If you are age 69 or older, your body mass index should be between 23-30. Your Body mass index is 22.66 kg/m. If this is out of the aforementioned range listed, please consider follow up with your Primary Care Provider.  If you are age 9 or younger, your body mass index should be between 19-25. Your Body mass index is 22.66 kg/m. If this is out of the aformentioned range listed, please consider follow up with your Primary Care Provider.   ________________________________________________________  The [place] [facility] providers would like to encourage you to use [facility] to communicate with providers for non-urgent requests or questions.  Due to long hold times on the telephone, sending your provider a message by [facility] may be a faster and more efficient way to get a response.  Please allow 48 business hours for a response.  Please remember that this is for non-urgent requests.  _______________________________________________________  We have sent the following medications to your pharmacy for you to pick up at your convenience: Omeprazole 20 mg: Take twice a day for 30 days, then decrease to one daily thereafter Carafate tablets: Take 1 tablet by mouth every 6 hours as needed.   You have been scheduled for an MRI at [facility], 1st floor, Radiology. Your appointment is scheduled on 5-22 at 8:00am. Please arrive 30 minutes prior to your appointment time for registration purposes. Please make certain not to have anything to eat or drink 6 hours prior to your test. In addition, if you have any metal in your body, have a pacemaker or defibrillator, please be sure to let your ordering physician know. This test typically takes 45 minutes to 1 hour to complete. Should you need to reschedule, please call [phone].   You will be due for a recall colonoscopy in 12-2025. We will send you a reminder in the mail when it gets closer to that time.   Thank you for entrusting me with your care and for  choosing [facility], Dr. [place] Cellar

## 2021-09-10 ENCOUNTER — Ambulatory Visit: Payer: Medicare Other | Admitting: Nurse Practitioner

## 2021-09-20 DIAGNOSIS — Z20822 Contact with and (suspected) exposure to covid-19: Secondary | ICD-10-CM | POA: Diagnosis not present

## 2021-10-02 ENCOUNTER — Ambulatory Visit (INDEPENDENT_AMBULATORY_CARE_PROVIDER_SITE_OTHER): Payer: Medicare Other | Admitting: Family Medicine

## 2021-10-02 ENCOUNTER — Encounter (HOSPITAL_BASED_OUTPATIENT_CLINIC_OR_DEPARTMENT_OTHER): Payer: Self-pay | Admitting: Family Medicine

## 2021-10-02 ENCOUNTER — Ambulatory Visit
Admission: RE | Admit: 2021-10-02 | Discharge: 2021-10-02 | Disposition: A | Discharge status: Home / Self Care | Payer: Medicare Other | Source: Ambulatory Visit | Attending: Family Medicine | Admitting: Family Medicine

## 2021-10-02 ENCOUNTER — Other Ambulatory Visit: Payer: Self-pay

## 2021-10-02 VITALS — BP 122/78 | HR 72 | Temp 97.6°F | Ht 64.0 in | Wt 134.8 lb

## 2021-10-02 DIAGNOSIS — M25571 Pain in right ankle and joints of right foot: Secondary | ICD-10-CM

## 2021-10-02 DIAGNOSIS — G8929 Other chronic pain: Secondary | ICD-10-CM

## 2021-10-02 DIAGNOSIS — M7989 Other specified soft tissue disorders: Secondary | ICD-10-CM | POA: Diagnosis not present

## 2021-10-02 IMAGING — CR DG ANKLE COMPLETE 3+V*R*
3 series · 3 of 3 positions shown · non-contrast
Comparison: None.

CLINICAL DATA: Acute on chronic right ankle pain with swelling over
lateral aspect.

EXAM:
RIGHT ANKLE - COMPLETE 3+ VIEW

[x ankle ap right]
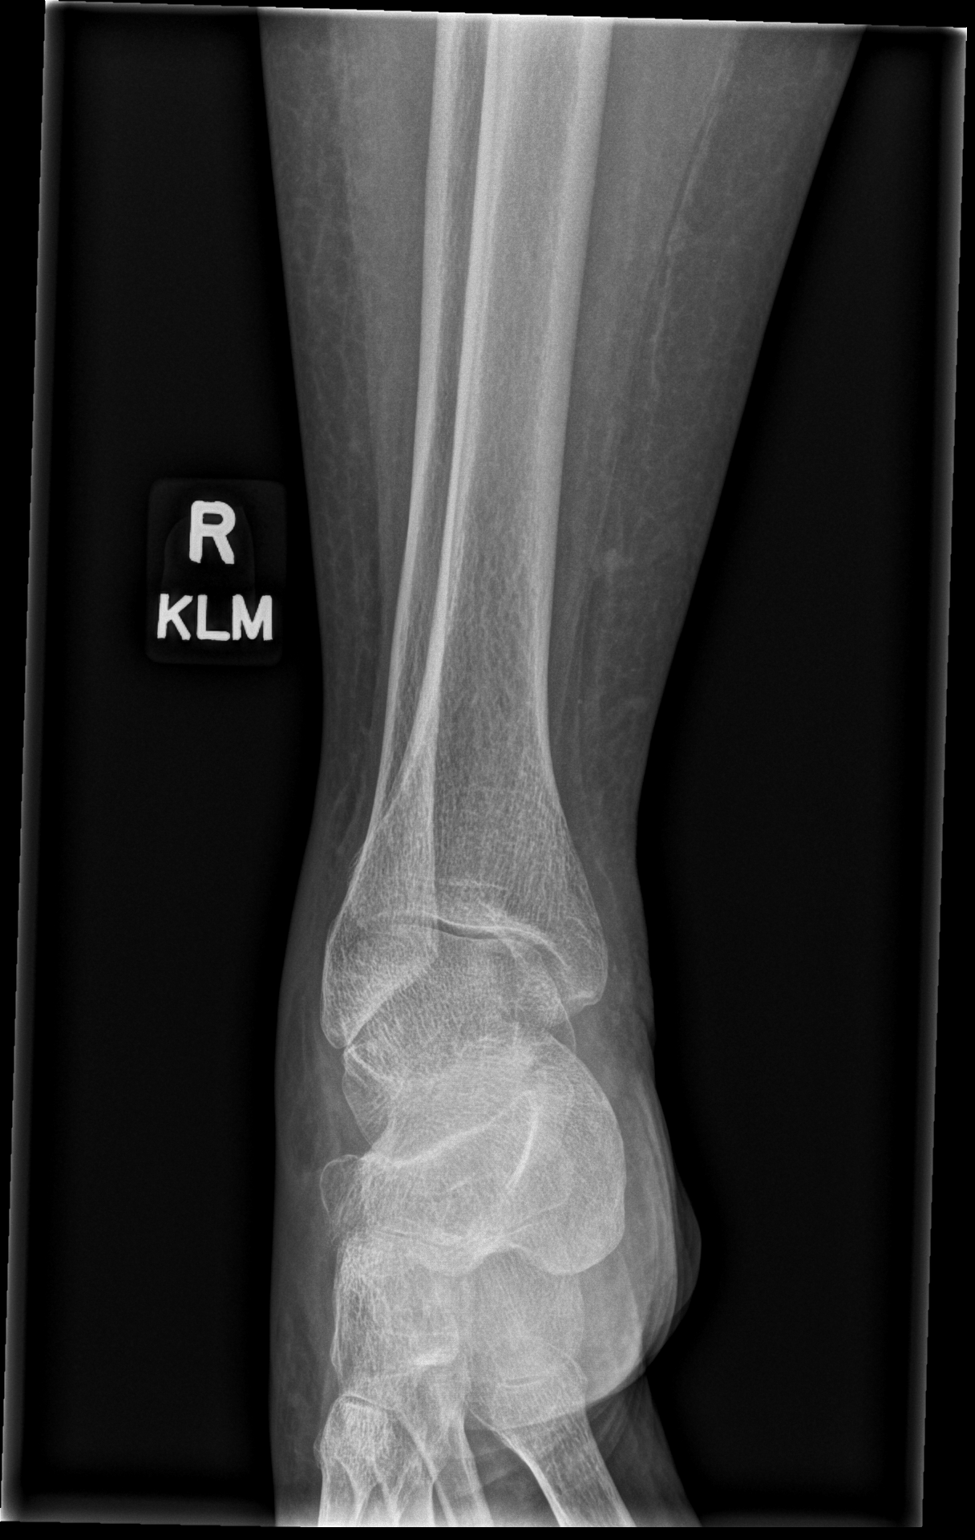

[x ankle obl right]
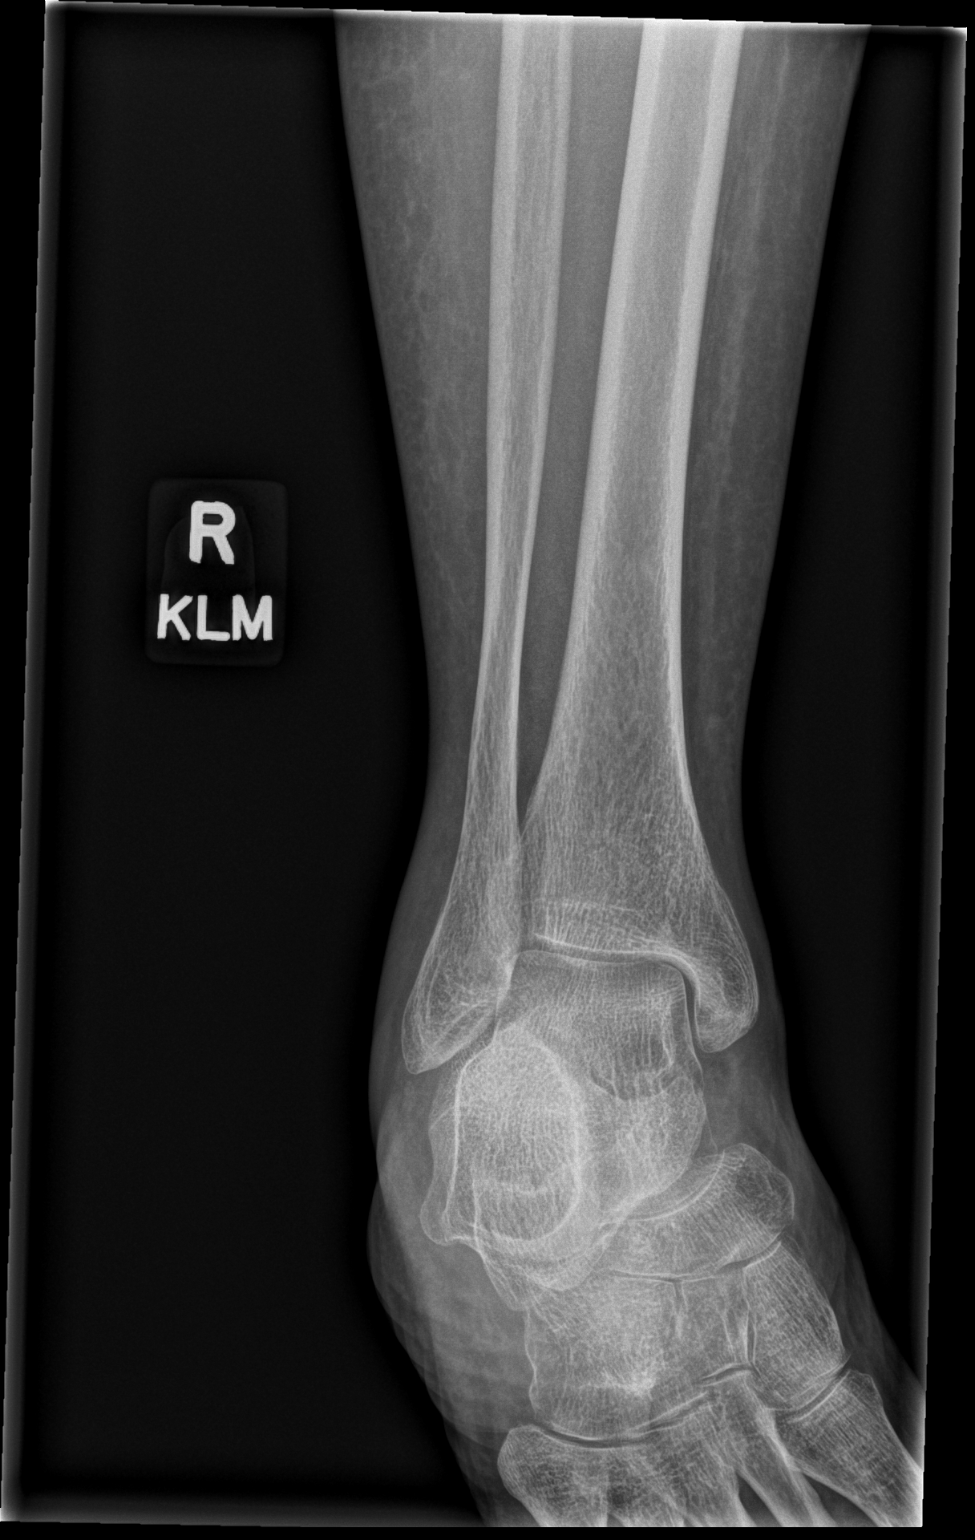

[x ankle lat right]
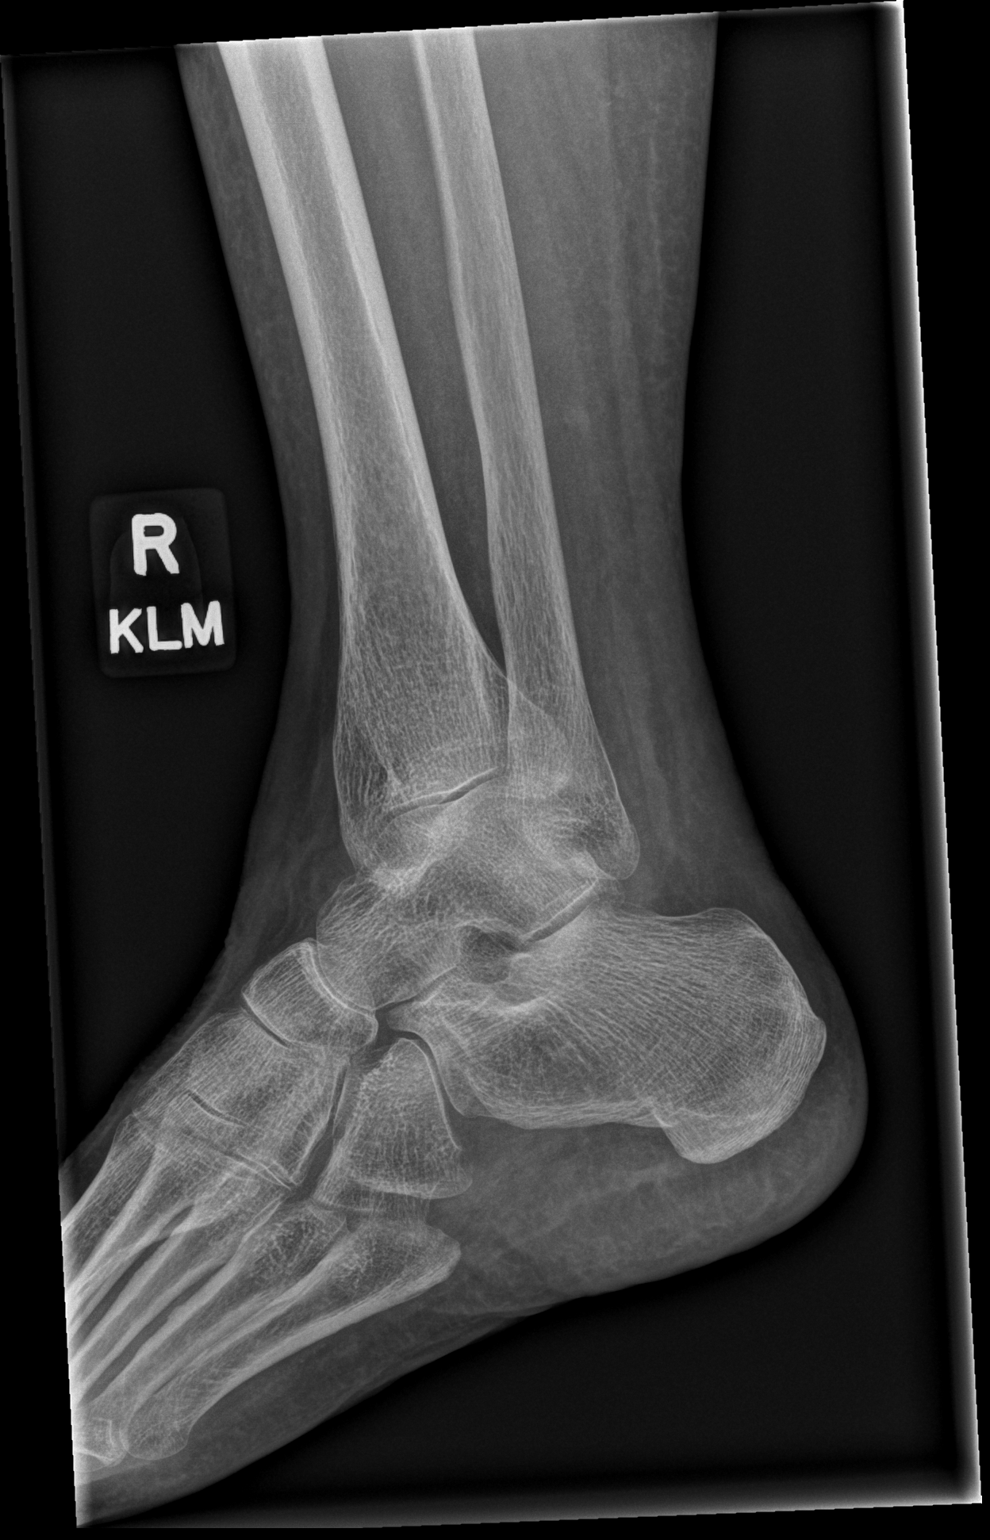

[3 of 3 positions shown; findings below may reference images not displayed]

FINDINGS: There is no evidence of fracture, dislocation, or joint effusion.
There is no evidence of significant arthropathy or other focal bone
abnormality. Mild soft tissue swelling over the lateral ankle.
IMPRESSION: Mild soft tissue swelling over the lateral ankle. No acute osseous
abnormality.

## 2021-10-02 NOTE — Patient Instructions (Addendum)
Edema ?Edema is when you have too much fluid in your body or under your skin. Edema may make your legs, feet, and ankles swell. Swelling often happens in looser tissues, such as around your eyes. This is a common condition. It gets more common as you get older. ?There are many possible causes of edema. These include: ?Eating too much salt (sodium). ?Being on your feet or sitting for a long time. ?Certain medical conditions, such as: ?Pregnancy. ?Heart failure. ?Liver disease. ?Kidney disease. ?Cancer. ?Hot weather may make edema worse. Edema is usually painless. Your skin may look swollen or shiny. ?Follow these instructions at home: ?Medicines ?Take over-the-counter and prescription medicines only as told by your doctor. ?Your doctor may prescribe a medicine to help your body get rid of extra water (diuretic). Take this medicine if you are told to take it. ?Eating and drinking ?Eat a low-salt (low-sodium) diet as told by your doctor. Sometimes, eating less salt may reduce swelling. ?Depending on the cause of your swelling, you may need to limit how much fluid you drink (fluid restriction). ?General instructions ?Raise the injured area above the level of your heart while you are sitting or lying down. ?Do not sit still or stand for a long time. ?Do not wear tight clothes. Do not wear garters on your upper legs. ?Exercise your legs. This can help the swelling go down. ?Wear compression stockings as told by your doctor. It is important that these are the right size. These should be prescribed by your doctor to prevent possible injuries. ?If elastic bandages or wraps are recommended, use them as told by your doctor. ?Contact a doctor if: ?Treatment is not working. ?You have heart, liver, or kidney disease and have symptoms of edema. ?You have sudden and unexplained weight gain. ?Get help right away if: ?You have shortness of breath or chest pain. ?You cannot breathe when you lie down. ?You have pain, redness, or warmth  in the swollen areas. ?You have heart, liver, or kidney disease and get edema all of a sudden. ?You have a fever and your symptoms get worse all of a sudden. ?These symptoms may be an emergency. Get help right away. Call 911. ?Do not wait to see if the symptoms will go away. ?Do not drive yourself to the hospital. ?Summary ?Edema is when you have too much fluid in your body or under your skin. ?Edema may make your legs, feet, and ankles swell. Swelling often happens in looser tissues, such as around your eyes. ?Raise the injured area above the level of your heart while you are sitting or lying down. ?Follow your doctor's instructions about diet and how much fluid you can drink. ?This information is not intended to replace advice given to you by your health care provider. Make sure you discuss any questions you have with your health care provider. ?Document Revised: 03/05/2021 Document Reviewed: 03/05/2021 ?Elsevier Patient Education ? [place]. ? ? ?Medication Instructions:  ?Your physician recommends that you continue on your current medications as directed. Please refer to the Current Medication list given to you today. ?--If you need a refill on any your medications before your next appointment, please call your pharmacy first. If no refills are authorized on file call the office.-- ? ? ?Referrals/Procedures/Imaging: ?Xray at [place] ? ?Follow-Up: ?Your next appointment:   ?Your physician recommends that you schedule a follow-up appointment in: PRN with Dr. [place] ? ?You will receive a text message or e-mail with a link to  a survey about your care and experience with [place] today! We would greatly appreciate your feedback!  ? ?Thanks for letting us be apart of your health journey!!  ?Primary Care and Sports Medicine  ? ?Dr. Kyung Rudd [place]  ? ?We encourage you to activate your patient portal called "MyChart".  Sign up information is provided on this After Visit Summary.  MyChart is used to  connect with patients for Virtual Visits (Telemedicine).  Patients are able to view lab/test results, encounter notes, upcoming appointments, etc.  Non-urgent messages can be sent to your provider as well. To learn more about what you can do with MyChart, please visit --  [email or web address].    ?

## 2021-10-02 NOTE — Progress Notes (Signed)
? ? ?  Procedures performed today:   ? ?None. ? ?Independent interpretation of notes and tests performed by another provider:  ? ?None. ? ?Brief History, Exam, Impression, and Recommendations:   ? ?BP 122/78   Pulse 72   Temp 97.6 ?F (36.4 ?C)   Ht '5\' 4"'$  (1.626 m)   Wt 134 lb 12.8 oz (61.1 kg)   SpO2 98%   BMI 23.14 kg/m?  ? ?Right ankle pain ?Cheyenne Sanders is a 71 year old female presenting for evaluation of right ankle pain.  She has a longstanding history of left ankle and heel issues with prior imaging and evaluation with podiatry ?She indicates that right ankle has been painful over the past 1 to 2 years, however has been worsening over the past couple months.  She has had some associated swelling for a few weeks.  Pain and swelling are primarily over lateral aspect of ankle.  She notes worsening of symptoms when she is walking, particular if she has been sitting for a prolonged period of time and then starts walking.  She has tried various conservative measures including topical Voltaren, Ace bandage wrap.  Has not had significant relief with these interventions ?On exam there is moderate swelling over the lateral aspect of ankle, there is some pitting edema present both anterior and posterior to lateral malleolus.  Patient with normal passive and active range of motion, normal strength for dorsiflexion, plantarflexion, eversion and inversion.  No tenderness to palpation over proximal fifth metatarsal.  No significant anterior joint line tenderness. ?Patient with chronic right ankle pain with acute worsening.  Uncertain etiology with generally unremarkable exam except for notable edema over lateral aspect of ankle ?Within differential include peroneal tendinitis, arthritis, stress reaction.  At this time, recommend proceeding with initial x-ray imaging given duration of symptoms and acute worsening ?If x-rays unremarkable, could consider short period of immobilization with Cam boot for about 2 to 4 weeks and  proceed with referral to physical therapy for local treatment and gradual progression of stretching and strengthening exercises.  In that case, would plan for follow-up in about 4 to 6 weeks to monitor progress ? ? ?___________________________________________ ?Cheyenne Dercole [place], MD, ABFM, CAQSM ?Primary Care and Sports Medicine ?[place] ?

## 2021-10-02 NOTE — Assessment & Plan Note (Signed)
Cheyenne Sanders is a 71 year old female presenting for evaluation of right ankle pain.  She has a longstanding history of left ankle and heel issues with prior imaging and evaluation with podiatry ?She indicates that right ankle has been painful over the past 1 to 2 years, however has been worsening over the past couple months.  She has had some associated swelling for a few weeks.  Pain and swelling are primarily over lateral aspect of ankle.  She notes worsening of symptoms when she is walking, particular if she has been sitting for a prolonged period of time and then starts walking.  She has tried various conservative measures including topical Voltaren, Ace bandage wrap.  Has not had significant relief with these interventions ?On exam there is moderate swelling over the lateral aspect of ankle, there is some pitting edema present both anterior and posterior to lateral malleolus.  Patient with normal passive and active range of motion, normal strength for dorsiflexion, plantarflexion, eversion and inversion.  No tenderness to palpation over proximal fifth metatarsal.  No significant anterior joint line tenderness. ?Patient with chronic right ankle pain with acute worsening.  Uncertain etiology with generally unremarkable exam except for notable edema over lateral aspect of ankle ?Within differential include peroneal tendinitis, arthritis, stress reaction.  At this time, recommend proceeding with initial x-ray imaging given duration of symptoms and acute worsening ?If x-rays unremarkable, could consider short period of immobilization with Cam boot for about 2 to 4 weeks and proceed with referral to physical therapy for local treatment and gradual progression of stretching and strengthening exercises.  In that case, would plan for follow-up in about 4 to 6 weeks to monitor progress ?

## 2021-10-04 DIAGNOSIS — N8111 Cystocele, midline: Secondary | ICD-10-CM | POA: Diagnosis not present

## 2021-10-05 ENCOUNTER — Ambulatory Visit (HOSPITAL_BASED_OUTPATIENT_CLINIC_OR_DEPARTMENT_OTHER): Payer: Medicare Other | Admitting: Family Medicine

## 2021-10-08 DIAGNOSIS — M7751 Other enthesopathy of right foot: Secondary | ICD-10-CM | POA: Diagnosis not present

## 2021-10-09 ENCOUNTER — Telehealth: Payer: Self-pay | Admitting: Gastroenterology

## 2021-10-09 NOTE — Telephone Encounter (Signed)
Called and spoke to patient.  Let her know that Dr. Havery Moros indicates she would be OK to take the Medrol as prescribed by her podiatrist.  ?

## 2021-10-09 NOTE — Telephone Encounter (Signed)
If her podiatrist recommended it I don't see any contraindications from what she has seen me for in the past. I would think okay to use ?

## 2021-10-09 NOTE — Telephone Encounter (Signed)
Patient called said she was given Medrol medication by her Podiatrist and she wants to know if that would be ok for her to take. ?

## 2021-10-22 ENCOUNTER — Telehealth: Payer: Self-pay | Admitting: Gastroenterology

## 2021-10-22 NOTE — Telephone Encounter (Signed)
Called and spoke to patient. She has been taking omeprazole once a day. She was instructed to try to not take it and see if Sx reoccur. If they do and are bothersome, she can take Prilosec daily.  We discussed that Carafate is something she can take as needed. She expressed understanding. All questions answered  ? ?

## 2021-10-22 NOTE — Telephone Encounter (Signed)
Inbound call from patient. Have questions about if she should stop Prilosec abruptly when she is completely out or if she gradually come off? ? ?She also have questions if she should take sucralfate ?

## 2021-10-24 DIAGNOSIS — M216X2 Other acquired deformities of left foot: Secondary | ICD-10-CM | POA: Diagnosis not present

## 2021-10-24 DIAGNOSIS — M7751 Other enthesopathy of right foot: Secondary | ICD-10-CM | POA: Diagnosis not present

## 2021-10-24 DIAGNOSIS — M216X1 Other acquired deformities of right foot: Secondary | ICD-10-CM | POA: Diagnosis not present

## 2021-11-07 DIAGNOSIS — N8111 Cystocele, midline: Secondary | ICD-10-CM | POA: Diagnosis not present

## 2021-11-11 DIAGNOSIS — Z20822 Contact with and (suspected) exposure to covid-19: Secondary | ICD-10-CM | POA: Diagnosis not present

## 2021-11-20 DIAGNOSIS — Z20822 Contact with and (suspected) exposure to covid-19: Secondary | ICD-10-CM | POA: Diagnosis not present

## 2021-11-28 DIAGNOSIS — Z129 Encounter for screening for malignant neoplasm, site unspecified: Secondary | ICD-10-CM | POA: Diagnosis not present

## 2021-11-28 DIAGNOSIS — L821 Other seborrheic keratosis: Secondary | ICD-10-CM | POA: Diagnosis not present

## 2021-11-28 DIAGNOSIS — B351 Tinea unguium: Secondary | ICD-10-CM | POA: Diagnosis not present

## 2021-11-28 DIAGNOSIS — D2371 Other benign neoplasm of skin of right lower limb, including hip: Secondary | ICD-10-CM | POA: Diagnosis not present

## 2021-11-28 DIAGNOSIS — D2372 Other benign neoplasm of skin of left lower limb, including hip: Secondary | ICD-10-CM | POA: Diagnosis not present

## 2021-11-28 DIAGNOSIS — D2271 Melanocytic nevi of right lower limb, including hip: Secondary | ICD-10-CM | POA: Diagnosis not present

## 2021-11-28 DIAGNOSIS — M7751 Other enthesopathy of right foot: Secondary | ICD-10-CM | POA: Diagnosis not present

## 2021-11-28 DIAGNOSIS — L720 Epidermal cyst: Secondary | ICD-10-CM | POA: Diagnosis not present

## 2021-12-03 ENCOUNTER — Ambulatory Visit (HOSPITAL_COMMUNITY)
Admission: RE | Admit: 2021-12-03 | Discharge: 2021-12-03 | Disposition: A | Discharge status: Home / Self Care | Payer: Medicare Other | Source: Ambulatory Visit | Attending: Gastroenterology | Admitting: Gastroenterology

## 2021-12-03 ENCOUNTER — Other Ambulatory Visit: Payer: Self-pay | Admitting: Gastroenterology

## 2021-12-03 DIAGNOSIS — K5730 Diverticulosis of large intestine without perforation or abscess without bleeding: Secondary | ICD-10-CM | POA: Diagnosis not present

## 2021-12-03 DIAGNOSIS — K862 Cyst of pancreas: Secondary | ICD-10-CM

## 2021-12-03 DIAGNOSIS — K8020 Calculus of gallbladder without cholecystitis without obstruction: Secondary | ICD-10-CM | POA: Diagnosis not present

## 2021-12-03 DIAGNOSIS — Z86010 Personal history of colonic polyps: Secondary | ICD-10-CM | POA: Insufficient documentation

## 2021-12-03 DIAGNOSIS — K219 Gastro-esophageal reflux disease without esophagitis: Secondary | ICD-10-CM | POA: Insufficient documentation

## 2021-12-03 DIAGNOSIS — K7689 Other specified diseases of liver: Secondary | ICD-10-CM | POA: Diagnosis not present

## 2021-12-03 IMAGING — MR MR ABDOMEN WO/W CM MRCP
14 of 17 series · 43 of 48 positions shown · IV contrast (gadavist)
Comparison: Multiple priors including MRI [DATE]

CLINICAL DATA: Follow-up pancreatic cysts.

EXAM:
MRI ABDOMEN WITHOUT AND WITH CONTRAST (INCLUDING MRCP)
TECHNIQUE: Multiplanar multisequence MR imaging of the abdomen was performed
both before and after the administration of intravenous contrast.
Heavily T2-weighted images of the biliary and pancreatic ducts were
obtained, and three-dimensional MRCP images were rendered by post
processing.
CONTRAST:  6mL GADAVIST GADOBUTROL 1 MMOL/ML IV SOLN

[Series 3: T2 fat-sat · axial · 6.0mm · 1.09mm/px · 1 of 32 slices shown]
[im 1/32]
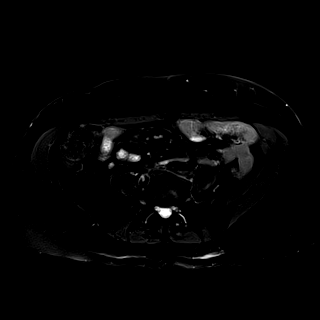

[Series 7: T2 · coronal · 6.0mm · 1.56mm/px · 1 of 28 slices shown (1 of 2)]
[im 1/28]
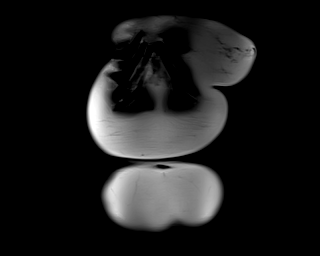

[Series 8: DWI · axial · 6.0mm · 1.49mm/px · z∈[-103,+149]mm · 4 of 72 slices shown (1 of 2)]
[im 1/72]
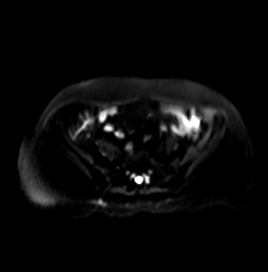
[im 24/72]
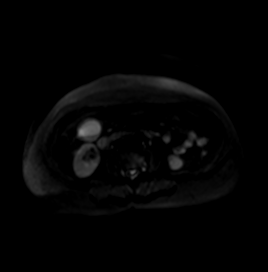
[im 48/72]
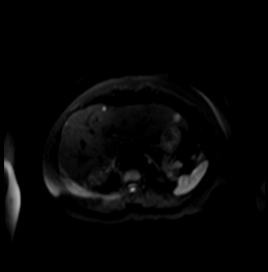
[im 72/72]
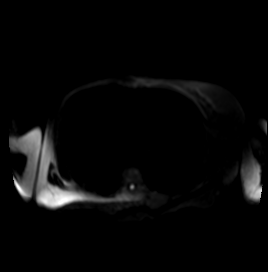

[Series 9: DWI · axial · 6.0mm · 1.49mm/px · z∈[-103,+149]mm · 2 of 36 slices shown (2 of 2)]
[im 1/36]
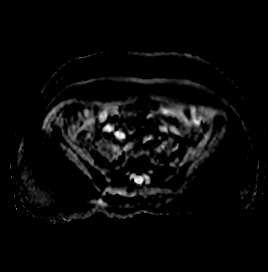
[im 36/36]
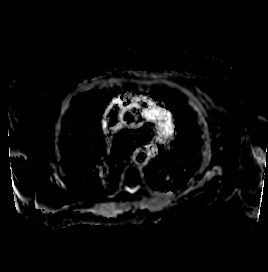

[Series 10: T1 · axial · 3.0mm · 1.09mm/px · z∈[-115,+122]mm · 4 of 80 slices shown (1 of 2)]
[im 1/80]
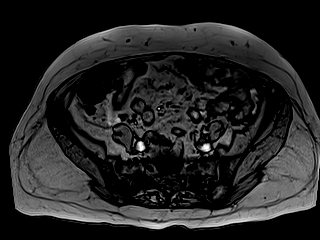
[im 27/80]
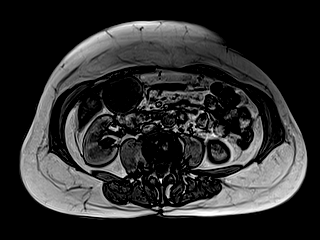
[im 53/80]
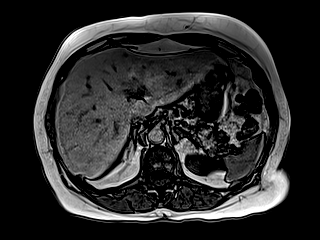
[im 80/80]
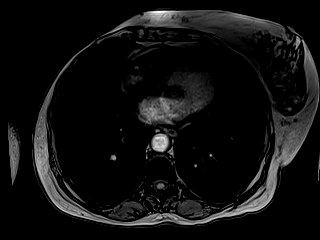

[Series 11: T1 · axial · 3.0mm · 1.09mm/px · z∈[-115,+122]mm · 4 of 80 slices shown (2 of 2)]
[im 1/80]
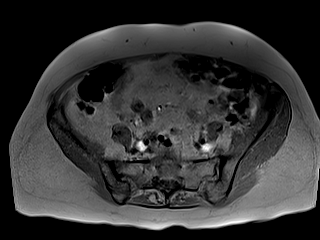
[im 27/80]
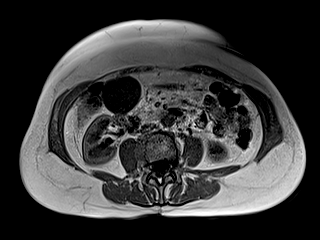
[im 53/80]
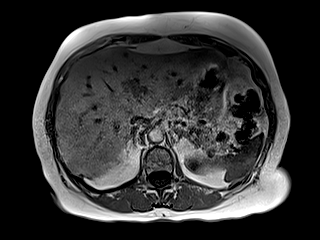
[im 80/80]
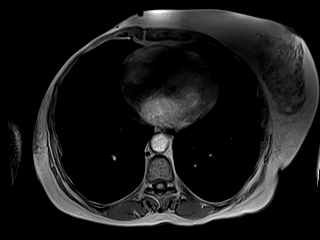

[Series 12: cor obl thk · sagittal · 50.0mm · 0.78mm/px · 1 of 9 slices shown]
[im 1/9]
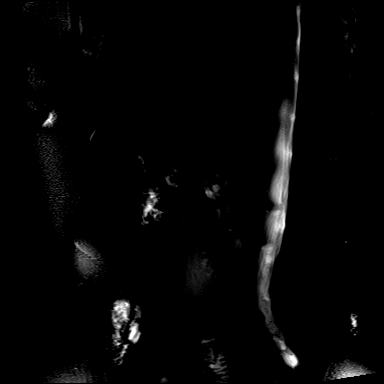

[Series 16: T2 · axial · 6.0mm · 1.37mm/px · z∈[-82,+127]mm · 2 of 30 slices shown (2 of 2)]
[im 1/30]
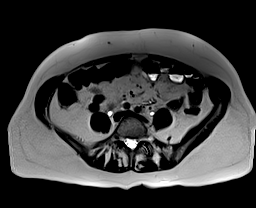
[im 30/30]
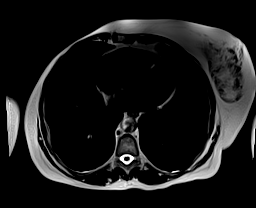

[Series 18: T1 dynamic · axial · 3.0mm · 1.09mm/px · z∈[-115,+122]mm · 4 of 80 slices shown (1 of 6)]
[im 1/80]
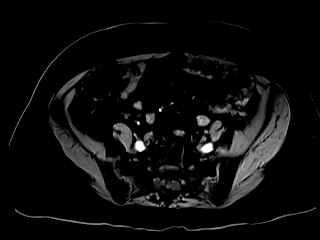
[im 27/80]
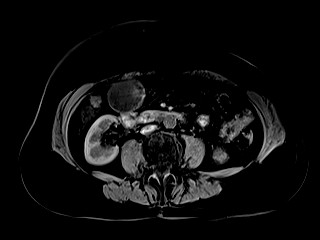
[im 53/80]
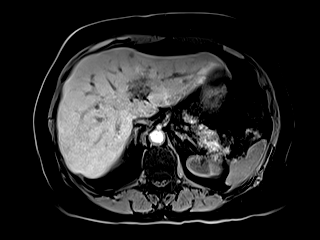
[im 80/80]
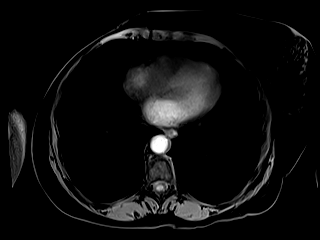

[Series 22: T1 dynamic · axial · 3.0mm · 1.09mm/px · z∈[-115,+122]mm · 4 of 80 slices shown (2 of 6)]
[im 1/80]
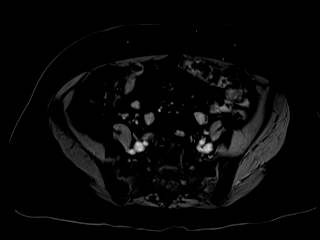
[im 27/80]
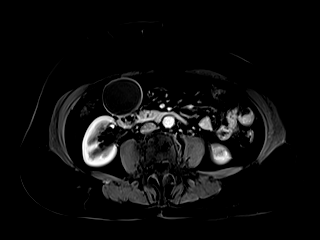
[im 53/80]
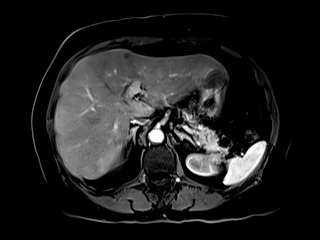
[im 80/80]
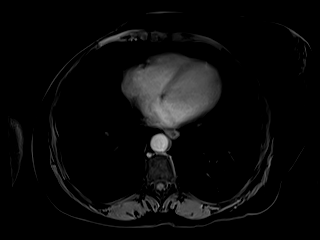

[Series 24: T1 dynamic · axial · 3.0mm · 1.09mm/px · z∈[-115,+122]mm · 4 of 80 slices shown (3 of 6)]
[im 1/80]
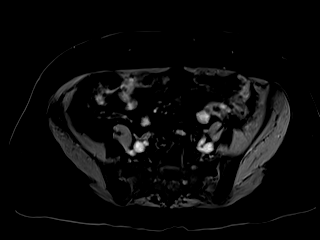
[im 27/80]
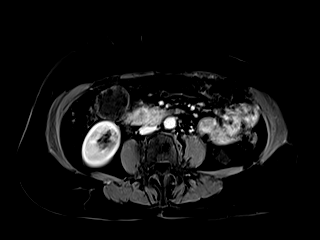
[im 53/80]
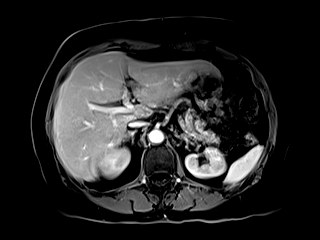
[im 80/80]
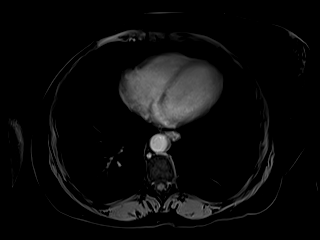

[Series 26: T1 dynamic · axial · 3.0mm · 1.09mm/px · z∈[-115,+122]mm · 4 of 80 slices shown (4 of 6)]
[im 1/80]
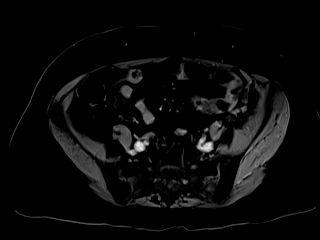
[im 27/80]
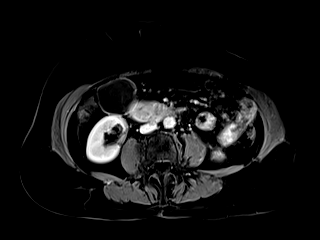
[im 53/80]
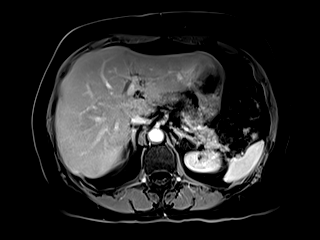
[im 80/80]
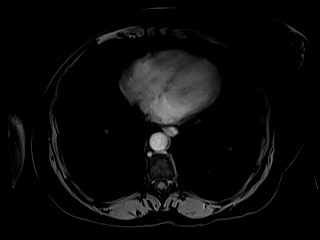

[Series 28: T1 dynamic · coronal · 3.0mm · 1.41mm/px · 4 of 72 slices shown (5 of 6)]
[im 1/72]
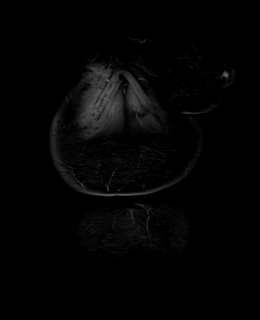
[im 24/72]
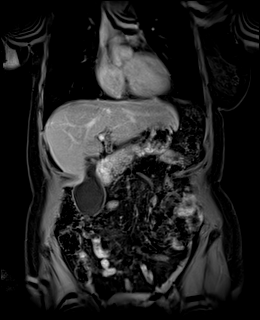
[im 48/72]
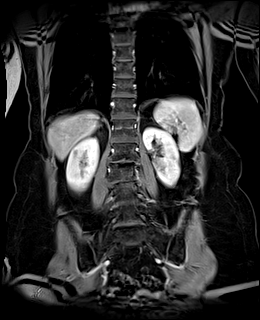
[im 72/72]
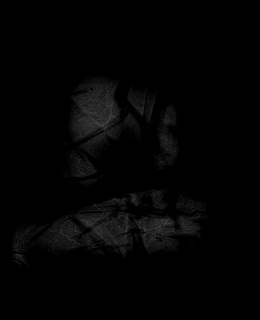

[Series 30: T1 dynamic · axial · 3.0mm · 1.09mm/px · z∈[-115,+122]mm · 4 of 80 slices shown (6 of 6)]
[im 1/80]
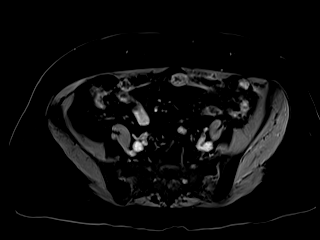
[im 27/80]
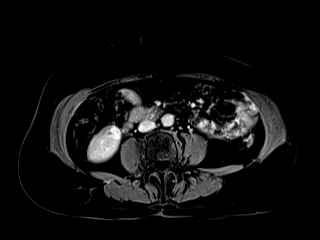
[im 53/80]
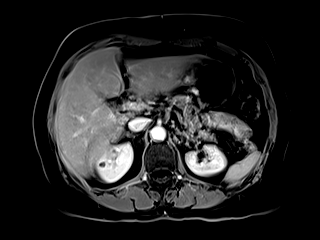
[im 80/80]
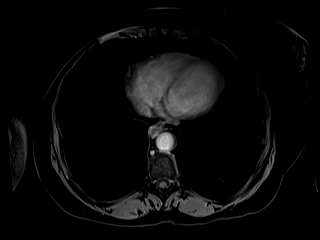

[43 of 48 positions shown; findings below may reference images not displayed]

FINDINGS: Lower chest: No acute abnormality.

Hepatobiliary: Nonenhancing bilobar hepatic cysts measure up to 14
mm in the left lobe of the liver. No suspicious hepatic lesion.
Cholelithiasis in a distended gallbladder without findings of acute
cholecystitis. No biliary ductal dilation.

Pancreas: Cystic lesions scattered throughout the pancreas measure
up to 15 mm on image [DATE], unchanged from prior. A few of which
demonstrate ductal communication but none of which demonstrate
suspicious postcontrast enhancement. No main pancreatic ductal
dilation.

Spleen:  Within normal limits in size and appearance.

Adrenals/Urinary Tract: Adrenal gland appears normal. Benign renal
sinus cysts which require no follow-up. No suspicious renal mass.

Stomach/Bowel: Colonic diverticulosis without findings of acute
diverticulitis. No pathologic dilation or evidence of acute
inflammation involving the visualized loops of large or small
bowel.

Vascular/Lymphatic: No pathologically enlarged lymph nodes
identified. No abdominal aortic aneurysm demonstrated.

Other:  Significant abdominal free fluid.

Musculoskeletal: No suspicious bone lesions identified.
IMPRESSION: 1. Stable scattered cystic pancreatic lesions measuring up to 15 mm,
none of which demonstrate suspicious MRI features. Likely reflecting
side branch IPMNs. Recommend continued follow up pre and post
contrast MRI/MRCP in 1 year. This recommendation follows ACR
consensus guidelines: Management of Incidental Pancreatic Cysts: A
White Paper of the ACR Incidental Findings Committee. [HOSPITAL] [6O];[DATE].
2. Cholelithiasis without findings of acute cholecystitis.
3. Colonic diverticulosis without findings of acute diverticulitis.

## 2021-12-03 IMAGING — MR MR 3D RECON AT SCANNER
12 of 16 series · 34 of 48 positions shown · IV contrast (gadavist)
Comparison: Multiple priors including MRI [DATE]

CLINICAL DATA: Follow-up pancreatic cysts.

EXAM:
MRI ABDOMEN WITHOUT AND WITH CONTRAST (INCLUDING MRCP)
TECHNIQUE: Multiplanar multisequence MR imaging of the abdomen was performed
both before and after the administration of intravenous contrast.
Heavily T2-weighted images of the biliary and pancreatic ducts were
obtained, and three-dimensional MRCP images were rendered by post
processing.
CONTRAST:  6mL GADAVIST GADOBUTROL 1 MMOL/ML IV SOLN

[Series 4: T2 fat-sat · axial · 6.0mm · 1.09mm/px · 1 of 32 slices shown]
[im 1/32]
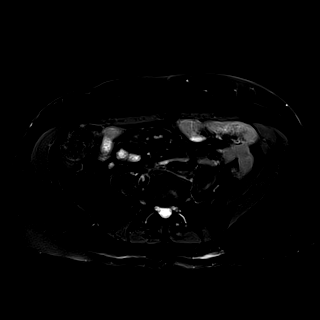

[Series 8: T2 · coronal · 6.0mm · 1.56mm/px · 1 of 28 slices shown (1 of 2)]
[im 1/28]
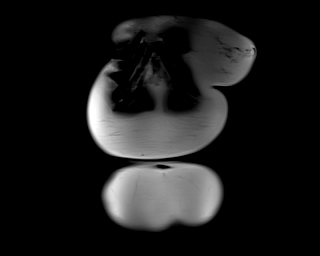

[Series 9: DWI · axial · 6.0mm · 1.49mm/px · z∈[-103,+149]mm · 4 of 72 slices shown (1 of 2)]
[im 1/72]
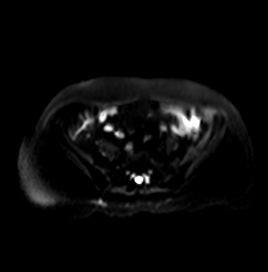
[im 24/72]
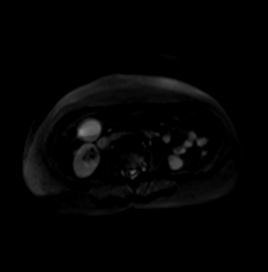
[im 48/72]
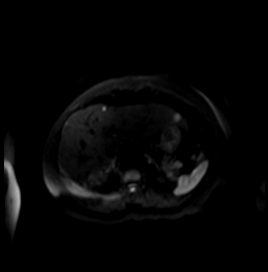
[im 72/72]
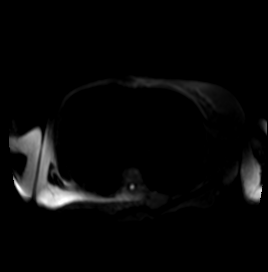

[Series 10: DWI · axial · 6.0mm · 1.49mm/px · z∈[-103,+149]mm · 2 of 36 slices shown (2 of 2)]
[im 1/36]
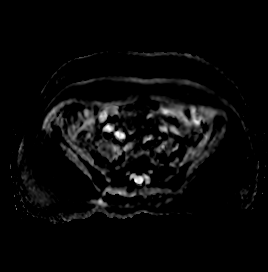
[im 36/36]
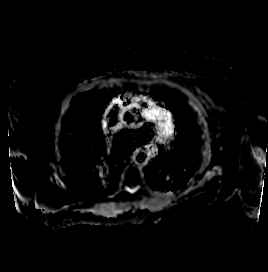

[Series 11: T1 · axial · 3.0mm · 1.09mm/px · z∈[-115,+122]mm · 4 of 80 slices shown (1 of 2)]
[im 1/80]
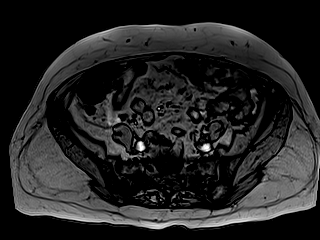
[im 27/80]
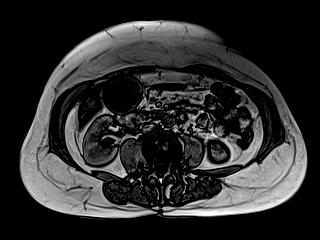
[im 53/80]
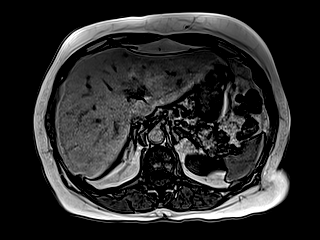
[im 80/80]
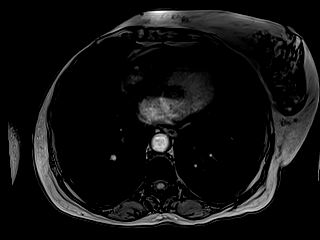

[Series 12: T1 · axial · 3.0mm · 1.09mm/px · z∈[-115,+122]mm · 4 of 80 slices shown (2 of 2)]
[im 1/80]
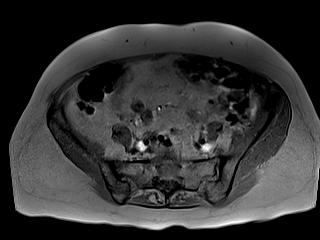
[im 27/80]
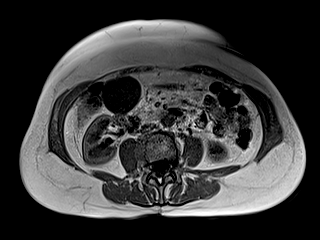
[im 53/80]
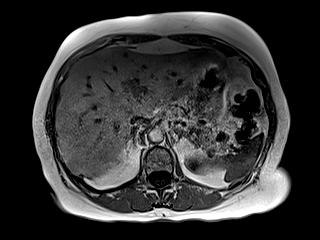
[im 80/80]
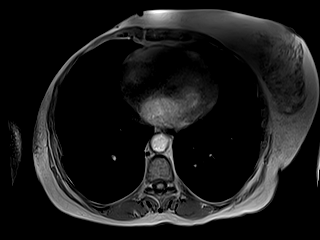

[Series 13: cor obl thk · sagittal · 50.0mm · 0.78mm/px · 1 of 9 slices shown]
[im 1/9]
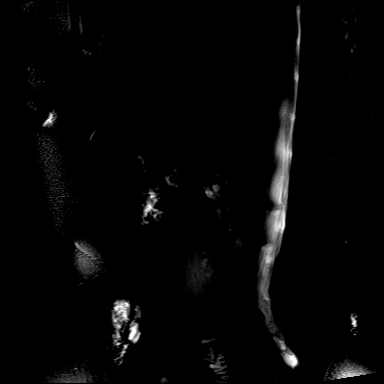

[Series 15: cor_3d_spc_trig · coronal · 1.0mm · 0.49mm/px · 4 of 72 slices shown]
[im 1/72]
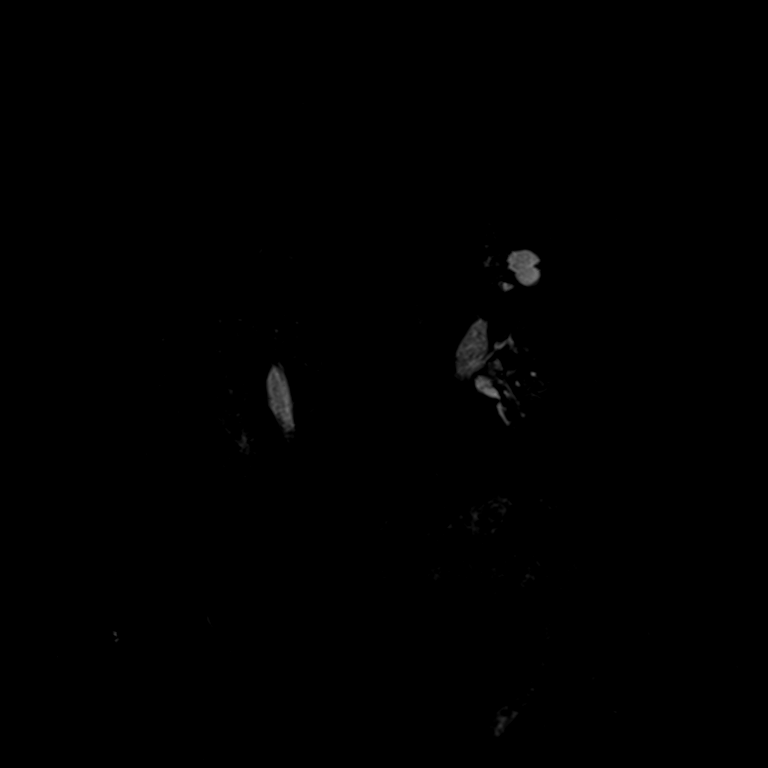
[im 24/72]
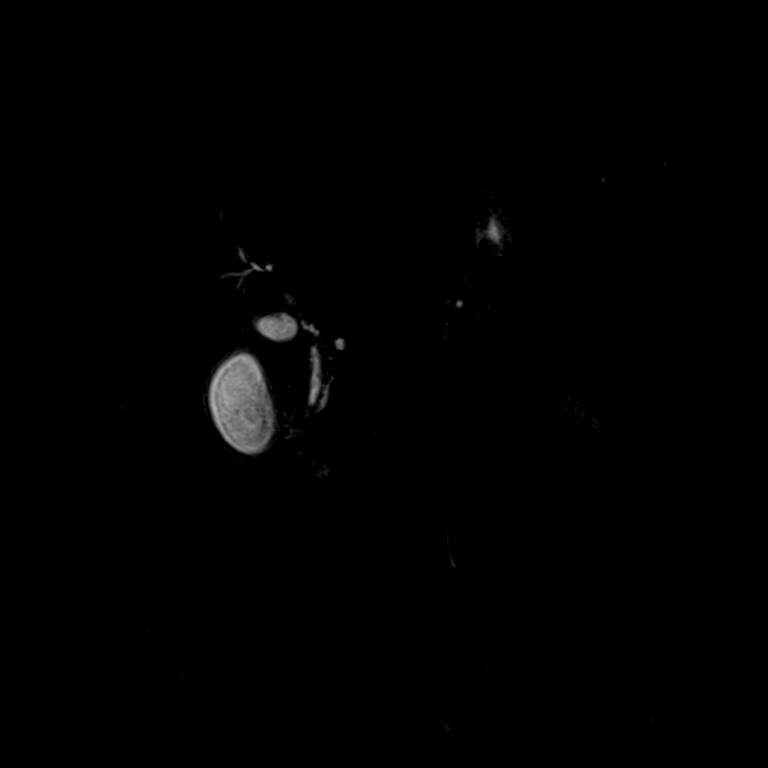
[im 48/72]
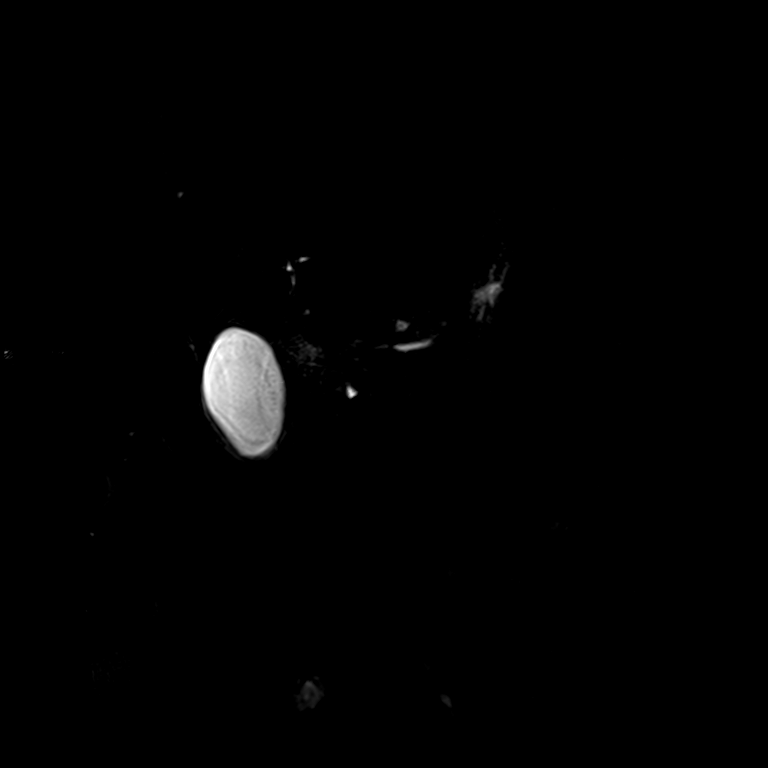
[im 72/72]
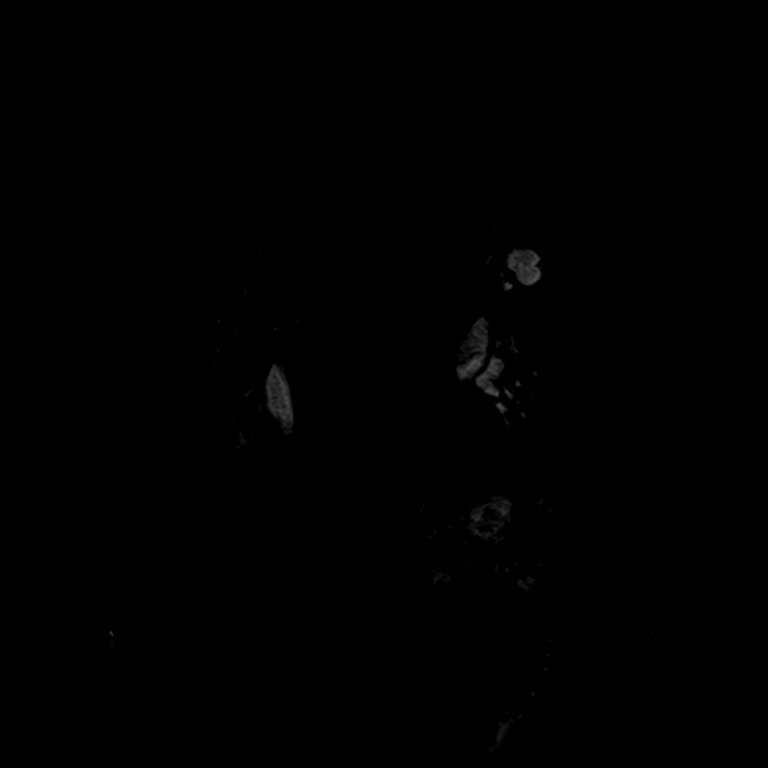

[Series 17: T2 · axial · 6.0mm · 1.37mm/px · 1 of 30 slices shown (2 of 2)]
[im 1/30]
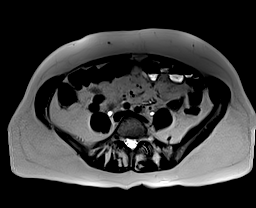

[Series 19: T1 dynamic · axial · 3.0mm · 1.09mm/px · z∈[-115,+122]mm · 4 of 80 slices shown (1 of 3)]
[im 1/80]
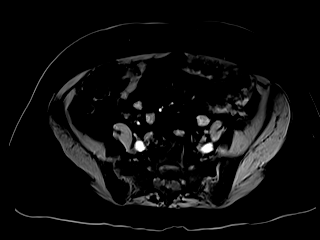
[im 27/80]
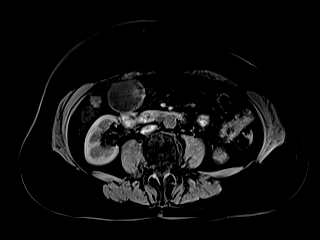
[im 53/80]
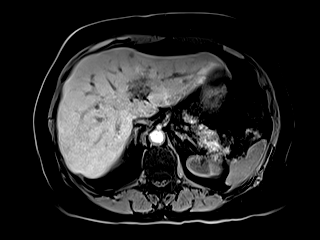
[im 80/80]
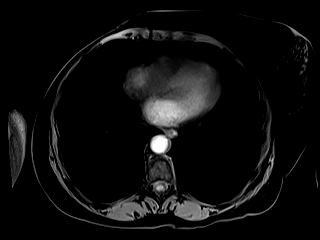

[Series 22: T1 dynamic · axial · 3.0mm · 1.09mm/px · z∈[-115,+122]mm · 4 of 80 slices shown (2 of 3)]
[im 1/80]
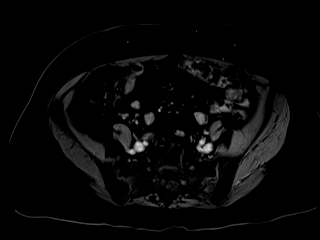
[im 27/80]
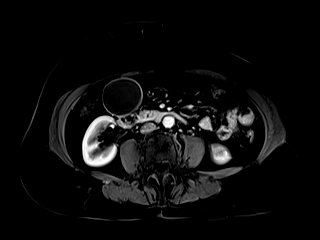
[im 53/80]
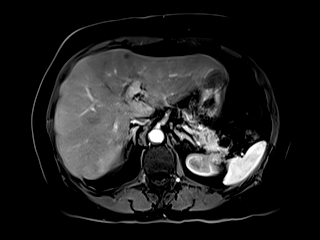
[im 80/80]
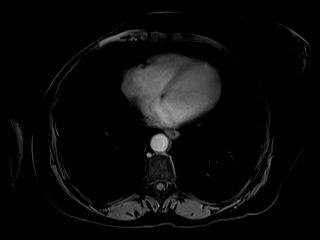

[Series 24: T1 dynamic · axial · 3.0mm · 1.09mm/px · z∈[-115,+122]mm · 4 of 80 slices shown (3 of 3)]
[im 1/80]
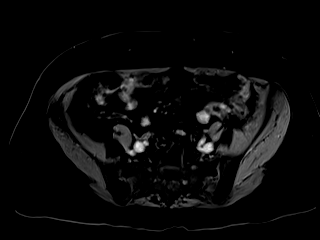
[im 27/80]
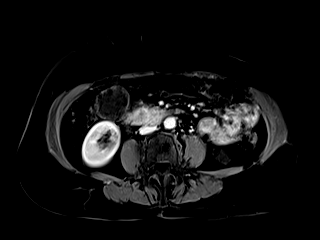
[im 53/80]
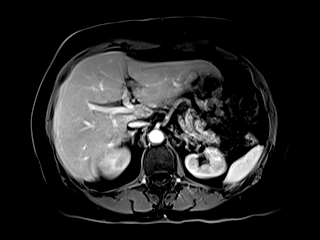
[im 80/80]
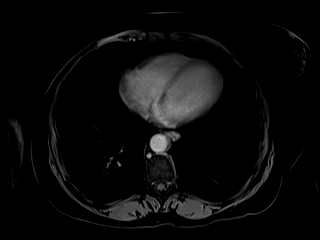

[34 of 48 positions shown; findings below may reference images not displayed]

FINDINGS: Lower chest: No acute abnormality.

Hepatobiliary: Nonenhancing bilobar hepatic cysts measure up to 14
mm in the left lobe of the liver. No suspicious hepatic lesion.
Cholelithiasis in a distended gallbladder without findings of acute
cholecystitis. No biliary ductal dilation.

Pancreas: Cystic lesions scattered throughout the pancreas measure
up to 15 mm on image [DATE], unchanged from prior. A few of which
demonstrate ductal communication but none of which demonstrate
suspicious postcontrast enhancement. No main pancreatic ductal
dilation.

Spleen:  Within normal limits in size and appearance.

Adrenals/Urinary Tract: Adrenal gland appears normal. Benign renal
sinus cysts which require no follow-up. No suspicious renal mass.

Stomach/Bowel: Colonic diverticulosis without findings of acute
diverticulitis. No pathologic dilation or evidence of acute
inflammation involving the visualized loops of large or small
bowel.

Vascular/Lymphatic: No pathologically enlarged lymph nodes
identified. No abdominal aortic aneurysm demonstrated.

Other:  Significant abdominal free fluid.

Musculoskeletal: No suspicious bone lesions identified.
IMPRESSION: 1. Stable scattered cystic pancreatic lesions measuring up to 15 mm,
none of which demonstrate suspicious MRI features. Likely reflecting
side branch IPMNs. Recommend continued follow up pre and post
contrast MRI/MRCP in 1 year. This recommendation follows ACR
consensus guidelines: Management of Incidental Pancreatic Cysts: A
White Paper of the ACR Incidental Findings Committee. [HOSPITAL] [6O];[DATE].
2. Cholelithiasis without findings of acute cholecystitis.
3. Colonic diverticulosis without findings of acute diverticulitis.

## 2021-12-03 MED ORDER — GADOBUTROL 1 MMOL/ML IV SOLN
6.0000 mL | Freq: Once | INTRAVENOUS | Status: AC | PRN
  Administered 2021-12-03: 6 mL via INTRAVENOUS

## 2021-12-24 DIAGNOSIS — H52223 Regular astigmatism, bilateral: Secondary | ICD-10-CM | POA: Diagnosis not present

## 2021-12-24 DIAGNOSIS — H5203 Hypermetropia, bilateral: Secondary | ICD-10-CM | POA: Diagnosis not present

## 2021-12-24 DIAGNOSIS — H524 Presbyopia: Secondary | ICD-10-CM | POA: Diagnosis not present

## 2021-12-24 DIAGNOSIS — H11153 Pinguecula, bilateral: Secondary | ICD-10-CM | POA: Diagnosis not present

## 2021-12-24 DIAGNOSIS — H2513 Age-related nuclear cataract, bilateral: Secondary | ICD-10-CM | POA: Diagnosis not present

## 2021-12-24 DIAGNOSIS — H43812 Vitreous degeneration, left eye: Secondary | ICD-10-CM | POA: Diagnosis not present

## 2021-12-24 DIAGNOSIS — H11131 Conjunctival pigmentations, right eye: Secondary | ICD-10-CM | POA: Diagnosis not present

## 2022-03-01 DIAGNOSIS — M6702 Short Achilles tendon (acquired), left ankle: Secondary | ICD-10-CM | POA: Diagnosis not present

## 2022-03-01 DIAGNOSIS — M7751 Other enthesopathy of right foot: Secondary | ICD-10-CM | POA: Diagnosis not present

## 2022-03-01 DIAGNOSIS — L602 Onychogryphosis: Secondary | ICD-10-CM | POA: Diagnosis not present

## 2022-03-01 DIAGNOSIS — M722 Plantar fascial fibromatosis: Secondary | ICD-10-CM | POA: Diagnosis not present

## 2022-03-27 ENCOUNTER — Encounter (HOSPITAL_BASED_OUTPATIENT_CLINIC_OR_DEPARTMENT_OTHER): Payer: Self-pay | Admitting: Nurse Practitioner

## 2022-03-27 ENCOUNTER — Ambulatory Visit (INDEPENDENT_AMBULATORY_CARE_PROVIDER_SITE_OTHER): Payer: Medicare Other | Admitting: Nurse Practitioner

## 2022-03-27 VITALS — BP 147/73 | HR 60 | Ht 64.0 in | Wt 133.0 lb

## 2022-03-27 DIAGNOSIS — R7309 Other abnormal glucose: Secondary | ICD-10-CM | POA: Diagnosis not present

## 2022-03-27 DIAGNOSIS — R531 Weakness: Secondary | ICD-10-CM

## 2022-03-27 DIAGNOSIS — E559 Vitamin D deficiency, unspecified: Secondary | ICD-10-CM

## 2022-03-27 DIAGNOSIS — R42 Dizziness and giddiness: Secondary | ICD-10-CM | POA: Diagnosis not present

## 2022-03-27 MED ORDER — MECLIZINE HCL 25 MG PO TABS
25.0000 mg | ORAL_TABLET | Freq: Three times a day (TID) | ORAL | 3 refills | Status: DC | PRN
Start: 2022-03-27 — End: 2022-08-06

## 2022-03-27 NOTE — Progress Notes (Signed)
Orma Render, DNP, AGNP-c [facility] [address]  [place], [place] [address] [phone]  Subjective:   Cheyenne Sanders is a 71 y.o. female presents to day for evaluation of: Acute Visit (Patient has been dizzy x 1 week. She is feeling light headed. She has had vertigo in the past, she states it feels the same. She states she has ear pain)   Episodic lightheadedness Vertigo Weakness She endorses waking up dizzy about one week ago. She reports she took a dramamine and felt better. She reports that on and off she has had these symptoms for "a long time", but they do seem to be getting worse.  She endorses feeling lightheaded and "funny" with "weird vision"/not as clear- her last eye appt was one month ago and all was normal.  She has a history of vertigo, but is not sure this feels the same.  She reports the sensation is "like my head has been shaken like a snowglobe"  Increased Hunger She reports increased hunger which is a change from her baseline.  She tells me that she craves high carb snacks after dinner at night.  She reports her blood sugar has been abnormal in the past and she is concerned about this with the cravings.     Murmur in the apical area very small  Nystagmus   PMH, Medications, and Allergies reviewed and updated in chart as appropriate.   ROS negative except for what is listed in HPI. Objective:  BP (!) 147/73   Pulse 60   Ht '5\' 4"'$  (1.626 m)   Wt 133 lb (60.3 kg)   SpO2 99%   BMI 22.83 kg/m  Physical Exam Vitals and nursing note reviewed.  Constitutional:      General: She is not in acute distress.    Appearance: Normal appearance. She is not ill-appearing.  HENT:     Head: Normocephalic.     Right Ear: Tympanic membrane normal.     Left Ear: Tympanic membrane normal.     Nose: Nose normal.     Mouth/Throat:     Mouth: Mucous membranes are moist.     Pharynx: Oropharynx is clear.   Eyes:     General: Lids are normal. Lids are everted, no foreign bodies appreciated. Vision grossly intact. Gaze aligned appropriately. No visual field deficit.    Extraocular Movements: Extraocular movements intact.     Right eye: Nystagmus present.     Left eye: No nystagmus.     Pupils: Pupils are equal, round, and reactive to light.  Neck:     Vascular: No carotid bruit.  Cardiovascular:     Rate and Rhythm: Normal rate and regular rhythm.     Pulses: Normal pulses.     Heart sounds: Murmur heard.     Comments: Chronic murmur Pulmonary:     Effort: Pulmonary effort is normal.     Breath sounds: Normal breath sounds.  Musculoskeletal:        General: Normal range of motion.     Cervical back: Normal range of motion.  Lymphadenopathy:     Cervical: No cervical adenopathy.  Skin:    General: Skin is warm and dry.     Capillary Refill: Capillary refill takes less than 2 seconds.  Neurological:     General: No focal deficit present.     Mental Status: She is alert and oriented to person, place, and time.     Cranial  Nerves: No cranial nerve deficit or dysarthria.     Sensory: No sensory deficit.     Motor: No weakness.     Coordination: Coordination normal.     Gait: Gait normal.     Deep Tendon Reflexes: Reflexes normal.  Psychiatric:        Mood and Affect: Mood normal.        Behavior: Behavior normal.        Thought Content: Thought content normal.        Judgment: Judgment normal.           Assessment & Plan:   1. Vitamin D deficiency Will check labs today given history. Has not had levels checked in a while.  - CBC with Differential/Platelet - Comprehensive metabolic panel - TSH - Hemoglobin A1c - VITAMIN D 25 Hydroxy (Vit-D Deficiency, Fractures)  2. Episodic lightheadedness Lightheadedness with positive nystagmus. No falls, weakness, or additional neurological symptoms present today. Suspect vertigo etiology. Will obtain labs for further evaluation to  ensure we are not missing anything.  - CBC with Differential/Platelet - Comprehensive metabolic panel - TSH - Hemoglobin A1c - VITAMIN D 25 Hydroxy (Vit-D Deficiency, Fractures)  3. Vertigo Symptoms and presentation consistent with vertigo. Will send treatment with meclizine and recommend close monitoring. Will send at home exercises to perform. If no improvement or symptoms change patient aware to follow-up. - meclizine (ANTIVERT) 25 MG tablet; Take 1 tablet (25 mg total) by mouth 3 (three) times daily as needed for dizziness.  Dispense: 30 tablet; Refill: 3  4. Weakness Subjective concerns. Will monitor labs today. Suspect that this is related to vertigo symptoms.  - TSH  5. Other abnormal glucose History of abnormal glucose with recent concerns of increased hunger and cravings for sweets. Will monitor a1c with labs to ensure no concerning signs present. Given nystagmus, unlikely this is causing dizziness or blurry vision, but would like to ensure we are not missing anything.  - Hemoglobin A1c    Orma Render, DNP, AGNP-c 04/07/2022  10:48 PM    History, Medications, Surgery, SDOH, and Family History reviewed and updated as appropriate.

## 2022-03-27 NOTE — Patient Instructions (Signed)
Your heart and lungs sound great today.  I want to check some labs to make sure that this is not an electrolyte imbalance or something going on with your kidneys or blood sugar.

## 2022-03-28 LAB — COMPREHENSIVE METABOLIC PANEL
ALT: 23 IU/L (ref 0–32)
AST: 25 IU/L (ref 0–40)
Albumin/Globulin Ratio: 2.3 — ABNORMAL HIGH (ref 1.2–2.2)
Albumin: 4.9 g/dL (ref 3.9–4.9)
Alkaline Phosphatase: 79 IU/L (ref 44–121)
BUN/Creatinine Ratio: 25 (ref 12–28)
BUN: 15 mg/dL (ref 8–27)
Bilirubin Total: 0.7 mg/dL (ref 0.0–1.2)
CO2: 25 mmol/L (ref 20–29)
Calcium: 10.3 mg/dL (ref 8.7–10.3)
Chloride: 101 mmol/L (ref 96–106)
Creatinine, Ser: 0.61 mg/dL (ref 0.57–1.00)
Globulin, Total: 2.1 g/dL (ref 1.5–4.5)
Glucose: 73 mg/dL (ref 70–99)
Potassium: 5.3 mmol/L — ABNORMAL HIGH (ref 3.5–5.2)
Sodium: 142 mmol/L (ref 134–144)
Total Protein: 7 g/dL (ref 6.0–8.5)
eGFR: 97 mL/min/{1.73_m2} (ref >59)

## 2022-03-28 LAB — CBC WITH DIFFERENTIAL/PLATELET
Basophils Absolute: 0.1 10*3/uL (ref 0.0–0.2)
Basos: 1 %
EOS (ABSOLUTE): 0 10*3/uL (ref 0.0–0.4)
Eos: 0 %
Hematocrit: 45.8 % (ref 34.0–46.6)
Hemoglobin: 15.1 g/dL (ref 11.1–15.9)
Immature Grans (Abs): 0 10*3/uL (ref 0.0–0.1)
Immature Granulocytes: 0 %
Lymphocytes Absolute: 2 10*3/uL (ref 0.7–3.1)
Lymphs: 28 %
MCH: 30.7 pg (ref 26.6–33.0)
MCHC: 33 g/dL (ref 31.5–35.7)
MCV: 93 fL (ref 79–97)
Monocytes Absolute: 0.5 10*3/uL (ref 0.1–0.9)
Monocytes: 7 %
Neutrophils Absolute: 4.5 10*3/uL (ref 1.4–7.0)
Neutrophils: 64 %
Platelets: 284 10*3/uL (ref 150–450)
RBC: 4.92 x10E6/uL (ref 3.77–5.28)
RDW: 13.2 % (ref 11.7–15.4)
WBC: 7.2 10*3/uL (ref 3.4–10.8)

## 2022-03-28 LAB — TSH: TSH: 3.54 u[IU]/mL (ref 0.450–4.500)

## 2022-03-28 LAB — HEMOGLOBIN A1C
Est. average glucose Bld gHb Est-mCnc: 120 mg/dL
Hgb A1c MFr Bld: 5.8 % — ABNORMAL HIGH (ref 4.8–5.6)

## 2022-03-28 LAB — VITAMIN D 25 HYDROXY (VIT D DEFICIENCY, FRACTURES): Vit D, 25-Hydroxy: 30.9 ng/mL (ref 30.0–100.0)

## 2022-05-09 ENCOUNTER — Ambulatory Visit (INDEPENDENT_AMBULATORY_CARE_PROVIDER_SITE_OTHER): Payer: Medicare Other | Admitting: Nurse Practitioner

## 2022-05-09 DIAGNOSIS — Z23 Encounter for immunization: Secondary | ICD-10-CM

## 2022-05-09 NOTE — Progress Notes (Signed)
Patient presented for nurse visit for administration of her flu vaccine.  After obtaining informed consent, the immunization is given by Terrill Mohr, CMA. Patient reportedly tolerated with no concerns.   I have reviewed this encounter including the documentation in this note and/or discussed this patient with the [facility]. All orders and medical decision making have been approved by the supervising primary care provider.   I am certifying that I agree with the content of this note as supervising primary care provider.   Orma Render, DNP, AGNP-c

## 2022-05-09 NOTE — Addendum Note (Signed)
Addended by: Ordell Prichett, Clarise Cruz E on: 05/09/2022 01:59 PM   Modules accepted: Level of Service

## 2022-05-21 DIAGNOSIS — N8111 Cystocele, midline: Secondary | ICD-10-CM | POA: Diagnosis not present

## 2022-05-21 DIAGNOSIS — Z4689 Encounter for fitting and adjustment of other specified devices: Secondary | ICD-10-CM | POA: Diagnosis not present

## 2022-06-11 ENCOUNTER — Ambulatory Visit (INDEPENDENT_AMBULATORY_CARE_PROVIDER_SITE_OTHER): Payer: Medicare Other | Admitting: Family Medicine

## 2022-06-11 ENCOUNTER — Ambulatory Visit (HOSPITAL_BASED_OUTPATIENT_CLINIC_OR_DEPARTMENT_OTHER): Payer: Medicare Other | Admitting: Family Medicine

## 2022-06-11 ENCOUNTER — Encounter (HOSPITAL_BASED_OUTPATIENT_CLINIC_OR_DEPARTMENT_OTHER): Payer: Self-pay | Admitting: Family Medicine

## 2022-06-11 VITALS — BP 151/78 | HR 69 | Temp 97.6°F | Ht 64.0 in | Wt 138.0 lb

## 2022-06-11 DIAGNOSIS — M76812 Anterior tibial syndrome, left leg: Secondary | ICD-10-CM | POA: Diagnosis not present

## 2022-06-11 HISTORY — DX: Anterior tibial syndrome, left leg: M76.812

## 2022-06-11 NOTE — Assessment & Plan Note (Signed)
Patient presents for evaluation of pain over her left ankle.  She reports that this started over the past several days.  Does not recall any inciting event, not aware of any specific increased activity or change in activities which preceded onset of pain.  Pain is primarily over anterior left ankle.  It will be worse with walking.  She has been trying topical Voltaren gel, heat with some relief.  Not aware of any increased swelling, no numbness or tingling over this area, slight tingling near inside of left heel. On exam, no obvious skin changes, no bruising or erythema noted.  Mild tenderness to palpation along tibialis anterior tendon more proximally near musculotendinous junction.  No significant swelling over anterior ankle or foot.  She does have slight puffiness in the distal aspect of foot.  Normal active and passive range of motion for dorsiflexion, plantarflexion, inversion and eversion.  She does have some pain with active dorsiflexion.  Good strength in all planes of motion.  Distal neurovascular exam is intact, normal DP and PT pulse. Exam most consistent with tendinitis of tibialis anterior tendon, primarily near musculotendinous junction.  Discussed treatment options including continuing topical NSAID use, ice or heat as preferred, home exercises.  Can also consider referral to physical therapy.  She does have a short leg walking boot at home, discussed that she can use this for short period of time to allow for some rest over about 1 to 2 weeks and then proceeding with use of rehab exercises. We will plan for follow-up as needed, discussed that if symptoms are not improving over the next 4 to 6 weeks, return to the office and we would consider x-ray imaging at time, referral to physical therapy

## 2022-06-11 NOTE — Patient Instructions (Signed)

## 2022-06-11 NOTE — Progress Notes (Signed)
    Procedures performed today:    None.  Independent interpretation of notes and tests performed by another provider:   None.  Brief History, Exam, Impression, and Recommendations:    BP (!) 151/78 (BP Location: Left Arm, Patient Position: Sitting, Cuff Size: Normal)   Pulse 69   Temp 97.6 F (36.4 C) (Oral)   Ht '5\' 4"'$  (1.626 m)   Wt 138 lb (62.6 kg)   SpO2 100%   BMI 23.69 kg/m   Anterior tibialis tendinitis of left lower extremity Patient presents for evaluation of pain over her left ankle.  She reports that this started over the past several days.  Does not recall any inciting event, not aware of any specific increased activity or change in activities which preceded onset of pain.  Pain is primarily over anterior left ankle.  It will be worse with walking.  She has been trying topical Voltaren gel, heat with some relief.  Not aware of any increased swelling, no numbness or tingling over this area, slight tingling near inside of left heel. On exam, no obvious skin changes, no bruising or erythema noted.  Mild tenderness to palpation along tibialis anterior tendon more proximally near musculotendinous junction.  No significant swelling over anterior ankle or foot.  She does have slight puffiness in the distal aspect of foot.  Normal active and passive range of motion for dorsiflexion, plantarflexion, inversion and eversion.  She does have some pain with active dorsiflexion.  Good strength in all planes of motion.  Distal neurovascular exam is intact, normal DP and PT pulse. Exam most consistent with tendinitis of tibialis anterior tendon, primarily near musculotendinous junction.  Discussed treatment options including continuing topical NSAID use, ice or heat as preferred, home exercises.  Can also consider referral to physical therapy.  She does have a short leg walking boot at home, discussed that she can use this for short period of time to allow for some rest over about 1 to 2 weeks  and then proceeding with use of rehab exercises. We will plan for follow-up as needed, discussed that if symptoms are not improving over the next 4 to 6 weeks, return to the office and we would consider x-ray imaging at time, referral to physical therapy  Return if symptoms worsen or fail to improve.   ___________________________________________ Cheyenne Pag[place], MD, ABFM, CAQSM [facility] [place]

## 2022-08-05 ENCOUNTER — Other Ambulatory Visit (HOSPITAL_BASED_OUTPATIENT_CLINIC_OR_DEPARTMENT_OTHER): Payer: Self-pay | Admitting: Nurse Practitioner

## 2022-08-05 DIAGNOSIS — I10 Essential (primary) hypertension: Secondary | ICD-10-CM

## 2022-08-06 ENCOUNTER — Encounter: Payer: Self-pay | Admitting: Nurse Practitioner

## 2022-08-06 ENCOUNTER — Ambulatory Visit (INDEPENDENT_AMBULATORY_CARE_PROVIDER_SITE_OTHER): Payer: Medicare Other | Admitting: Nurse Practitioner

## 2022-08-06 VITALS — BP 130/82 | HR 64 | Temp 97.8°F | Wt 134.6 lb

## 2022-08-06 DIAGNOSIS — E559 Vitamin D deficiency, unspecified: Secondary | ICD-10-CM

## 2022-08-06 DIAGNOSIS — E7800 Pure hypercholesterolemia, unspecified: Secondary | ICD-10-CM | POA: Diagnosis not present

## 2022-08-06 DIAGNOSIS — R7303 Prediabetes: Secondary | ICD-10-CM | POA: Diagnosis not present

## 2022-08-06 DIAGNOSIS — Z1231 Encounter for screening mammogram for malignant neoplasm of breast: Secondary | ICD-10-CM | POA: Diagnosis not present

## 2022-08-06 DIAGNOSIS — E538 Deficiency of other specified B group vitamins: Secondary | ICD-10-CM | POA: Diagnosis not present

## 2022-08-06 DIAGNOSIS — I10 Essential (primary) hypertension: Secondary | ICD-10-CM

## 2022-08-06 DIAGNOSIS — M542 Cervicalgia: Secondary | ICD-10-CM | POA: Diagnosis not present

## 2022-08-06 LAB — LIPID PANEL

## 2022-08-06 MED ORDER — METOPROLOL SUCCINATE ER 25 MG PO TB24: ORAL_TABLET | ORAL | 3 refills | Status: DC

## 2022-08-06 MED ORDER — ATORVASTATIN CALCIUM 10 MG PO TABS: 10.0000 mg | ORAL_TABLET | Freq: Every day | ORAL | 3 refills | Status: DC

## 2022-08-06 NOTE — Progress Notes (Signed)
Worthy Keeler, DNP, AGNP-c [facility]  [address] [place], [place] [address] [phone]  ESTABLISHED PATIENT- Chronic Health and/or Follow-Up Visit  Blood pressure 130/82, pulse 64, temperature 97.8 F (36.6 C), weight 134 lb 9.6 oz (61.1 kg).    Cheyenne Sanders is a 72 y.o. year old female presenting today for evaluation and management of the following:  HTN/HLD/PreDM Jacelynn tells me she has been doing well overall. She is in need of medication refills today and labs. She reports she has been compliant with her medications and she is not having any side effects. She is following a healthy diet plan. She denies CP, shob, palpitations.   Neck Pain Cheyenne Sanders reports concerns with a sharp pain that occurred on the left side of her neck around the area of her carotid artery. This occurred on one occasion about a month ago, but she tells me this was severe in nature and worried her. It only lasted for a second and there were no other associated symptoms that she is aware of. She does not think it was muscular in nature. She tells me that she is having "twinges" of discomfort in the same area of the right side. This has been going on for about the last week. She tells me that she has not had any URI symptoms or ear pain, no dizziness, weakness, vision changes, nausea, or palpitations when this happens. She has no neck pain. She does not think this occurs with movement.   All ROS negative with exception of what is listed above.   PHYSICAL EXAM Physical Exam Vitals and nursing note reviewed.  Constitutional:      Appearance: Normal appearance.  HENT:     Head: Normocephalic and atraumatic.     Right Ear: Ear canal and external ear normal.     Left Ear: Ear canal and external ear normal.     Ears:     Comments: TM slightly bulging and white in coloration bilaterally.   Eyes:     Extraocular Movements: Extraocular movements intact.     Conjunctiva/sclera: Conjunctivae  normal.     Pupils: Pupils are equal, round, and reactive to light.  Neck:     Thyroid: No thyromegaly.     Vascular: Normal carotid pulses. No carotid bruit or JVD.     Trachea: Trachea and phonation normal.  Cardiovascular:     Rate and Rhythm: Normal rate and regular rhythm.     Pulses: Normal pulses.     Heart sounds: Normal heart sounds.  Pulmonary:     Effort: Pulmonary effort is normal.     Breath sounds: Normal breath sounds.  Abdominal:     General: Bowel sounds are normal. There is no distension.     Palpations: Abdomen is soft.     Tenderness: There is no abdominal tenderness. There is no guarding.  Musculoskeletal:     Cervical back: Normal range of motion and neck supple. No rigidity or tenderness.     Right lower leg: No edema.     Left lower leg: No edema.  Lymphadenopathy:     Cervical: No cervical adenopathy.  Skin:    General: Skin is warm and dry.     Capillary Refill: Capillary refill takes less than 2 seconds.  Neurological:     General: No focal deficit present.     Mental Status: She is alert and oriented to person, place, and time.     Cranial Nerves: No cranial nerve deficit.  Sensory: No sensory deficit.     Motor: No weakness.     Gait: Gait normal.  Psychiatric:        Mood and Affect: Mood normal.        Behavior: Behavior normal.     PLAN Problem List Items Addressed This Visit     Primary hypertension - Primary    Chronic. BP is slightly elevated in the office today, but this is much improved from her typical white coat hypertension. Her BP is well controlled at home with goal <130/80. She is not having any alarm symptoms. Will obtain labs today for monitoring and refill metoprolol. We will plan to follow-up in 6 months      Relevant Medications   metoprolol succinate (TOPROL-XL) 25 MG 24 hr tablet   atorvastatin (LIPITOR) 10 MG tablet   Other Relevant Orders   Comprehensive metabolic panel   Lipid panel   Hemoglobin A1c   Pure  hypercholesterolemia    Chronic. Currently on atorvastatin '10mg'$ . No concerns at this time. Will monitor labs today.       Relevant Medications   metoprolol succinate (TOPROL-XL) 25 MG 24 hr tablet   atorvastatin (LIPITOR) 10 MG tablet   Other Relevant Orders   Comprehensive metabolic panel   Lipid panel   Hemoglobin A1c   Vitamin D deficiency    Repeat labs today for monitoring.       Relevant Orders   VITAMIN D 25 Hydroxy (Vit-D Deficiency, Fractures)   Pre-diabetes    Chronic. Currently diet controlled. No alarm symptoms present at this time. We will plan to monitor labs today and make changes as needed based on findings. Recommend continue with high fiber, low fat and carbohydrate diet and work to continue exercise routinely.       Relevant Orders   Hemoglobin A1c   Neck pain    Intermittent pain of unknown etiology. This could be related to eustachian tube changes or spasms. I feel it is much less likely related to the carotid artery given the lack of associated symptoms. She had a cardiac calcium CT 3 years ago which shows a score of 0. She sees ENT tomorrow, so I would like for their input. She will let me know if this returns or if she has any new or worsening symptoms.       Other Visit Diagnoses     B12 deficiency       Relevant Orders   B12 and Folate Panel   Screening mammogram for breast cancer       Relevant Orders   MM 3D SCREEN BREAST BILATERAL       Return in about 6 months (around 02/04/2023) for Chronic .   Worthy Keeler, DNP, AGNP-c 08/06/2022 11:40 AM

## 2022-08-06 NOTE — Assessment & Plan Note (Signed)
Chronic. Currently diet controlled. No alarm symptoms present at this time. We will plan to monitor labs today and make changes as needed based on findings. Recommend continue with high fiber, low fat and carbohydrate diet and work to continue exercise routinely.

## 2022-08-06 NOTE — Assessment & Plan Note (Signed)
Intermittent pain of unknown etiology. This could be related to eustachian tube changes or spasms. I feel it is much less likely related to the carotid artery given the lack of associated symptoms. She had a cardiac calcium CT 3 years ago which shows a score of 0. She sees ENT tomorrow, so I would like for their input. She will let me know if this returns or if she has any new or worsening symptoms.

## 2022-08-06 NOTE — Assessment & Plan Note (Signed)
Repeat labs today for monitoring.

## 2022-08-06 NOTE — Assessment & Plan Note (Signed)
Chronic. BP is slightly elevated in the office today, but this is much improved from her typical white coat hypertension. Her BP is well controlled at home with goal <130/80. She is not having any alarm symptoms. Will obtain labs today for monitoring and refill metoprolol. We will plan to follow-up in 6 months

## 2022-08-06 NOTE — Assessment & Plan Note (Signed)
Chronic. Currently on atorvastatin '10mg'$ . No concerns at this time. Will monitor labs today.

## 2022-08-06 NOTE — Patient Instructions (Addendum)
It was a pleasure seeing you today! Thank you for trusting me with your care.   If your neck pain continues please let me know. I am going to check with the doctors I work with and see what their thoughts are.   I have sent in the refills for you.    If labs were collected today, you will see the results as soon as they become available in [place]. I will review these labs once I have received all of the results and send you comments and recommendations, if any. If you have specific concerns, we can set up an appointment (virtual or in person) to go over details and come up with a plan together.   If you have received any referrals today, the office where the referral was made will be in contact with you to set up your appointment.   If you have received orders for imaging today, the imaging office will contact you to schedule this. Please note that some imaging requires a prior authorization from insurance, so an order is not a guarantee this will be covered by your insurance, but I want to assure you we will do our best to get this covered and if it is not, you will be notified and we can come up with an alternative plan.   If you take regular prescription medications, please contact your pharmacy for routine refill requests. They will send this directly to [place].  If you were ordered new medication as a part of your examination and treatment today, please contact your pharmacy to determine the status. Many prescriptions require a prior authorization and the pharmacy will contact us to get this started. This may take a few days for approval or denial. If the medication is denied, we will work with you to try alternative medications.   If you have any questions or concerns, please do not hesitate to contact the office via telephone or [place].  MyChart messages are received by the [facility] staff during regular business hours Monday through Friday and we do our best to respond in a timely manner.  If your  request requires an appointment, the staff will gladly help set that up so that we have the time dedicated to ensure that your questions are appropriately answered.

## 2022-08-07 ENCOUNTER — Encounter: Payer: Self-pay | Admitting: Nurse Practitioner

## 2022-08-07 DIAGNOSIS — H938X2 Other specified disorders of left ear: Secondary | ICD-10-CM | POA: Diagnosis not present

## 2022-08-07 DIAGNOSIS — L249 Irritant contact dermatitis, unspecified cause: Secondary | ICD-10-CM | POA: Diagnosis not present

## 2022-08-07 LAB — COMPREHENSIVE METABOLIC PANEL
ALT: 25 IU/L (ref 0–32)
AST: 26 IU/L (ref 0–40)
Albumin/Globulin Ratio: 2 (ref 1.2–2.2)
Albumin: 4.4 g/dL (ref 3.9–4.9)
Alkaline Phosphatase: 74 IU/L (ref 44–121)
BUN/Creatinine Ratio: 34 — ABNORMAL HIGH (ref 12–28)
BUN: 20 mg/dL (ref 8–27)
Bilirubin Total: 0.5 mg/dL (ref 0.0–1.2)
CO2: 24 mmol/L (ref 20–29)
Calcium: 9.6 mg/dL (ref 8.7–10.3)
Chloride: 106 mmol/L (ref 96–106)
Creatinine, Ser: 0.58 mg/dL (ref 0.57–1.00)
Globulin, Total: 2.2 g/dL (ref 1.5–4.5)
Glucose: 80 mg/dL (ref 70–99)
Potassium: 4.7 mmol/L (ref 3.5–5.2)
Sodium: 145 mmol/L — ABNORMAL HIGH (ref 134–144)
Total Protein: 6.6 g/dL (ref 6.0–8.5)
eGFR: 98 mL/min/{1.73_m2} (ref >59)

## 2022-08-07 LAB — LIPID PANEL
Chol/HDL Ratio: 3.3 ratio (ref 0.0–4.4)
Cholesterol, Total: 163 mg/dL (ref 100–199)
HDL: 50 mg/dL (ref >39)
LDL Chol Calc (NIH): 99 mg/dL (ref 0–99)
Triglycerides: 73 mg/dL (ref 0–149)
VLDL Cholesterol Cal: 14 mg/dL (ref 5–40)

## 2022-08-07 LAB — HEMOGLOBIN A1C
Est. average glucose Bld gHb Est-mCnc: 123 mg/dL
Hgb A1c MFr Bld: 5.9 % — ABNORMAL HIGH (ref 4.8–5.6)

## 2022-08-07 LAB — VITAMIN D 25 HYDROXY (VIT D DEFICIENCY, FRACTURES): Vit D, 25-Hydroxy: 32.8 ng/mL (ref 30.0–100.0)

## 2022-08-07 LAB — B12 AND FOLATE PANEL
Folate: >20 ng/mL
Vitamin B-12: 707 pg/mL (ref 232–1245)

## 2022-08-13 ENCOUNTER — Ambulatory Visit: Payer: Medicare Other

## 2022-08-14 ENCOUNTER — Telehealth: Payer: Self-pay | Admitting: Nurse Practitioner

## 2022-08-14 NOTE — Telephone Encounter (Signed)
Pt called, she is waiting to get lab results and she was also waiting to hear back about her neck She is still having pain

## 2022-08-16 ENCOUNTER — Ambulatory Visit: Payer: Medicare Other

## 2022-08-16 ENCOUNTER — Ambulatory Visit
Admission: RE | Admit: 2022-08-16 | Discharge: 2022-08-16 | Disposition: A | Discharge status: Home / Self Care | Payer: Medicare Other | Source: Ambulatory Visit | Attending: Nurse Practitioner | Admitting: Nurse Practitioner

## 2022-08-16 DIAGNOSIS — Z1231 Encounter for screening mammogram for malignant neoplasm of breast: Secondary | ICD-10-CM | POA: Diagnosis not present

## 2022-08-20 ENCOUNTER — Other Ambulatory Visit: Payer: Self-pay | Admitting: Nurse Practitioner

## 2022-08-20 DIAGNOSIS — R928 Other abnormal and inconclusive findings on diagnostic imaging of breast: Secondary | ICD-10-CM

## 2022-08-27 ENCOUNTER — Ambulatory Visit
Admission: RE | Admit: 2022-08-27 | Discharge: 2022-08-27 | Disposition: A | Discharge status: Home / Self Care | Payer: Medicare Other | Source: Ambulatory Visit | Attending: Nurse Practitioner | Admitting: Nurse Practitioner

## 2022-08-27 ENCOUNTER — Telehealth: Payer: Self-pay | Admitting: Nurse Practitioner

## 2022-08-27 ENCOUNTER — Other Ambulatory Visit: Payer: Self-pay | Admitting: Nurse Practitioner

## 2022-08-27 DIAGNOSIS — R921 Mammographic calcification found on diagnostic imaging of breast: Secondary | ICD-10-CM | POA: Diagnosis not present

## 2022-08-27 DIAGNOSIS — R928 Other abnormal and inconclusive findings on diagnostic imaging of breast: Secondary | ICD-10-CM | POA: Diagnosis not present

## 2022-08-27 NOTE — Telephone Encounter (Signed)
Pt called and wanted to know if she could test her blood and how to do it because she is pre diabetic. Pt can be reached at [phone].

## 2022-08-28 ENCOUNTER — Ambulatory Visit
Admission: RE | Admit: 2022-08-28 | Discharge: 2022-08-28 | Disposition: A | Discharge status: Home / Self Care | Payer: Medicare Other | Source: Ambulatory Visit | Attending: Nurse Practitioner | Admitting: Nurse Practitioner

## 2022-08-28 DIAGNOSIS — R921 Mammographic calcification found on diagnostic imaging of breast: Secondary | ICD-10-CM

## 2022-08-28 DIAGNOSIS — R928 Other abnormal and inconclusive findings on diagnostic imaging of breast: Secondary | ICD-10-CM

## 2022-08-28 DIAGNOSIS — N6489 Other specified disorders of breast: Secondary | ICD-10-CM | POA: Diagnosis not present

## 2022-08-28 HISTORY — PX: BREAST BIOPSY: SHX20

## 2022-08-28 NOTE — Telephone Encounter (Signed)
We have a sample glucometer in the cabinet at the nurse station. She is welcome to come by and get one to check her blood sugars. I recommend checking in the morning before she has eaten anything.

## 2022-09-03 ENCOUNTER — Telehealth: Payer: Self-pay | Admitting: Nurse Practitioner

## 2022-09-03 NOTE — Telephone Encounter (Signed)
Contacted Cheyenne Sanders to schedule their annual wellness visit. Patient declined to schedule AWV at this time.  Cheyenne Sanders AWV direct phone # [phone]   Spoke to patient she stated she did not want to schedule at this time.  She wanted a call back may/June

## 2022-09-24 DIAGNOSIS — L723 Sebaceous cyst: Secondary | ICD-10-CM | POA: Diagnosis not present

## 2022-12-05 ENCOUNTER — Telehealth: Payer: Self-pay | Admitting: Nurse Practitioner

## 2022-12-05 NOTE — Telephone Encounter (Signed)
error 

## 2022-12-06 ENCOUNTER — Telehealth: Payer: Self-pay

## 2022-12-06 DIAGNOSIS — K862 Cyst of pancreas: Secondary | ICD-10-CM

## 2022-12-06 NOTE — Telephone Encounter (Signed)
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

## 2022-12-10 NOTE — Telephone Encounter (Signed)
Is the patient requesting this specifically for reaction in the past, or is radiology requesting this? If patient drive, she can take Benadryl over-the-counter, 25 mg or 50 mg p.o. 1 hour prior to the procedure.  We can give her a dose or 2 of Zofran if she needs that as well, 4 mg ODT.  If radiology team is requesting something else, please let me know.  thanks

## 2022-12-10 NOTE — Telephone Encounter (Signed)
Attempted to reach patient - her line does not ring - goes straight to fast busy signal  MRCP order in epic.

## 2022-12-10 NOTE — Telephone Encounter (Signed)
Dr. Adela Lank, please advise on dose for Benadryl and Zofran prior to MRCP. Thanks

## 2022-12-11 MED ORDER — ONDANSETRON 4 MG PO TBDP
ORAL_TABLET | ORAL | 0 refills | Status: DC
Start: 2022-12-11 — End: 2023-02-14

## 2022-12-11 NOTE — Telephone Encounter (Signed)
Called and spoke with patient to remind her that she is due for 1 year repeat MRCP. Patient will take Benadryl 50 mg OTC and she is aware that Zofran RX has been sent to her pharmacy. Pt has been advised to have a driver since she will take Benadryl prior to her appt. Pt knows to expect a call from radiology scheduling to schedule MRCP. Patient also mentioned that she has noticed that her pills have been getting stuck, she states that pills are fairly small but she feels like she has to continuously swallow to get them down. Pt has tried applesauce and drinking plenty of fluids. Pt has been scheduled for a follow up appointment with Dr. Adela Lank on 03/25/23 at 1:40 pm. Pt will contact us in interim if symptoms improve or worsen. Pt verbalized understanding and had no concerns at the end of the call.   Secure staff message sent to radiology scheduling to contact patient to set up appt. Zofran RX sent to pharmacy on file.

## 2023-01-08 DIAGNOSIS — H52223 Regular astigmatism, bilateral: Secondary | ICD-10-CM | POA: Diagnosis not present

## 2023-01-08 DIAGNOSIS — H524 Presbyopia: Secondary | ICD-10-CM | POA: Diagnosis not present

## 2023-01-08 DIAGNOSIS — H5203 Hypermetropia, bilateral: Secondary | ICD-10-CM | POA: Diagnosis not present

## 2023-01-08 DIAGNOSIS — H2513 Age-related nuclear cataract, bilateral: Secondary | ICD-10-CM | POA: Diagnosis not present

## 2023-01-08 DIAGNOSIS — H11153 Pinguecula, bilateral: Secondary | ICD-10-CM | POA: Diagnosis not present

## 2023-01-08 DIAGNOSIS — H11131 Conjunctival pigmentations, right eye: Secondary | ICD-10-CM | POA: Diagnosis not present

## 2023-01-08 DIAGNOSIS — H43812 Vitreous degeneration, left eye: Secondary | ICD-10-CM | POA: Diagnosis not present

## 2023-01-21 ENCOUNTER — Encounter: Payer: Self-pay | Admitting: Nurse Practitioner

## 2023-01-21 ENCOUNTER — Ambulatory Visit (INDEPENDENT_AMBULATORY_CARE_PROVIDER_SITE_OTHER): Payer: Medicare Other | Admitting: Nurse Practitioner

## 2023-01-21 VITALS — BP 128/72 | HR 68 | Wt 133.4 lb

## 2023-01-21 DIAGNOSIS — R8290 Unspecified abnormal findings in urine: Secondary | ICD-10-CM | POA: Diagnosis not present

## 2023-01-21 DIAGNOSIS — R8271 Bacteriuria: Secondary | ICD-10-CM | POA: Diagnosis not present

## 2023-01-21 DIAGNOSIS — Z7189 Other specified counseling: Secondary | ICD-10-CM | POA: Diagnosis not present

## 2023-01-21 HISTORY — DX: Bacteriuria: R82.71

## 2023-01-21 LAB — POCT URINALYSIS DIP (CLINITEK)
Bilirubin, UA: NEGATIVE
Blood, UA: NEGATIVE
Glucose, UA: NEGATIVE mg/dL
Ketones, POC UA: NEGATIVE mg/dL
Nitrite, UA: NEGATIVE
POC PROTEIN,UA: NEGATIVE
Spec Grav, UA: 1.015 (ref 1.010–1.025)
Urobilinogen, UA: 0.2 E.U./dL
pH, UA: 6 (ref 5.0–8.0)

## 2023-01-21 MED ORDER — NITROFURANTOIN MONOHYD MACRO 100 MG PO CAPS: 100.0000 mg | ORAL_CAPSULE | Freq: Two times a day (BID) | ORAL | 0 refills | Status: DC

## 2023-01-21 NOTE — Assessment & Plan Note (Signed)
Discussion today for clarification on some of the items listed on advanced directives paperwork. We discussed the health care power of attorney and specific information she can provide to help make decisions easier if need should occur. We also discussed that at any time her wishes can change and her new wishes will be honored, despite what the paperwork says. I have recommended she have a conversation with her HCPOA to discuss her wishes and put this information in writing so it can be available for recall, if needed. All questions answered.

## 2023-01-21 NOTE — Patient Instructions (Addendum)
You can drink plenty of water to flush out the bladder and kidneys. You can also try AZO urinary tract relief, which is over the counter at the pharmacy.   If you are still having symptoms after a few days, then you can start the antibiotic.    Asymptomatic Bacteriuria Asymptomatic bacteriuria is the presence of a large number of bacteria in the urine without the usual symptoms of burning or frequent urination. This is usually not harmful, and treatment may not be needed. A person with this condition will not be more likely to develop an infection in the future. What are the causes? This condition is caused by an increase in bacteria in the urine. This increase can be caused by: Bacteria entering the urinary tract, such as during sex. A blockage in the urinary tract, such as from kidney stones or a tumor. Bladder problems that prevent the bladder from emptying. What increases the risk? You are more likely to develop this condition if: You have diabetes. You are an older adult. This especially affects older adults in long-term care facilities. You are pregnant and in the first trimester. You have kidney stones. You are female. You have had a kidney transplant. You have a leaky kidney tube valve (reflux). You had a urinary catheter for a long period of time. This is a long, thin tube that collects urine. What are the signs or symptoms? There are no symptoms of this condition. How is this diagnosed? This condition is diagnosed with a urine test. Because this condition does not cause symptoms, it is usually diagnosed when a urine sample is taken to treat or diagnose another condition, such as pregnancy or kidney problems. Most women who are in their first trimester of pregnancy are screened for asymptomatic bacteriuria. How is this treated? Usually, treatment is not needed for this condition. Treating the condition can lead to other problems, such as a yeast infection or the growth of bacteria  that do not respond to treatment (antibiotic-resistant bacteria). Some people do need treatment with antibiotic medicines to prevent kidney infection, known as pyelonephritis. Treatment is needed if: You are pregnant. In pregnant women, kidney infection can lead to: Cheyenne Sanders labor (premature labor). Very low birth weight (fetal growth restriction). Newborn death. You are having a procedure that affects the urinary tract. You have had a kidney transplant. If you are diagnosed with this condition, talk with your health care provider about any concerns that you have. Follow these instructions at home: Medicines Take over-the-counter and prescription medicines only as told by your health care provider. If you were prescribed an antibiotic medicine, take it as told by your health care provider. Do not stop using the antibiotic even if you start to feel better. General instructions Monitor your condition for any changes. Drink enough fluid to keep your urine pale yellow. Urinate more often to keep your bladder empty. If you are female, keep the area around your vagina and rectum clean. Wipe from front to back after urinating or having a bowel movement. Use each piece of toilet paper only once. Keep all follow-up visits. This is important. Contact a health care provider if: You have symptoms of a urine infection, such as: A burning sensation, or pain when you urinate. A strong need to urinate, or urinating more often. Urine turning discolored or cloudy. Blood in your urine. Urine that smells bad. Get help right away if: You develop signs of a kidney infection, such as: Back pain or pelvic pain. A fever  or chills. Nausea or vomiting. Severe pain that cannot be controlled with medicine. Summary Asymptomatic bacteriuria is the presence of a large number of bacteria in the urine without the usual symptoms of burning or frequent urination. Usually, treatment is not needed for this condition.  Treating the condition can lead to other problems, such as a yeast infection or the growth of bacteria that do not respond to treatment. Some people do need treatment. Treatment is needed if you are pregnant, if you are having a procedure that affects the urinary tract, or if you have had a kidney transplant. If you were prescribed an antibiotic medicine, take it as told by your health care provider. Do not stop using the antibiotic even if you start to feel better. This information is not intended to replace advice given to you by your health care provider. Make sure you discuss any questions you have with your health care provider. Document Revised: 02/06/2020 Document Reviewed: 02/11/2020 Elsevier Patient Education  2024 [employer].

## 2023-01-21 NOTE — Assessment & Plan Note (Signed)
Urine specimen from home at 4am shows nitrites present. Urine specimen from office shows leukocytes present. We discussed these findings and recommendations. She would like to avoid antibiotics, if possible. Given that she is not having symptoms, I feel this is reasonable.  Plan: -Focus on increased water intake and consider trying AZO - If no improvement of odor, start antibiotics.

## 2023-01-21 NOTE — Progress Notes (Signed)
Tollie Eth, DNP, AGNP-c [facility] [address] [place], [place] [address] [phone]   ACUTE VISIT- ESTABLISHED PATIENT  Blood pressure 128/72, pulse 68, weight 133 lb 6.4 oz (60.5 kg).  Subjective:  HPI Cheyenne Sanders is a 72 y.o. female presents to day for evaluation of: Malodorous urine.   Cheyenne Sanders reports that she first noticed symptoms of malodor about 2 weeks ago with odor associated with her urine. She increased her water intake and stopped her vitamins, however, this did not change the odor. She has not had any burning or back pain, which normally accompanies her symptoms. She would like to have her urine tested today.   She also had questions about her living will.   PMH, Medications, and Allergies reviewed and updated in chart as appropriate.   ROS negative except for what is listed in HPI. Objective:  Physical Exam Vitals and nursing note reviewed.  Constitutional:      Appearance: Normal appearance.  HENT:     Head: Normocephalic.  Eyes:     Pupils: Pupils are equal, round, and reactive to light.  Cardiovascular:     Rate and Rhythm: Normal rate and regular rhythm.     Pulses: Normal pulses.     Heart sounds: Normal heart sounds.  Pulmonary:     Effort: Pulmonary effort is normal.     Breath sounds: Normal breath sounds.  Abdominal:     General: Bowel sounds are normal. There is no distension.     Palpations: Abdomen is soft.     Tenderness: There is no abdominal tenderness. There is no right CVA tenderness, left CVA tenderness or guarding.  Musculoskeletal:        General: Normal range of motion.     Cervical back: Normal range of motion.  Skin:    General: Skin is warm.  Neurological:     General: No focal deficit present.     Mental Status: She is alert and oriented to person, place, and time.  Psychiatric:        Mood and Affect: Mood normal.         Assessment & Plan:   Problem List Items Addressed This Visit      Advanced directives, counseling/discussion    Discussion today for clarification on some of the items listed on advanced directives paperwork. We discussed the health care power of attorney and specific information she can provide to help make decisions easier if need should occur. We also discussed that at any time her wishes can change and her new wishes will be honored, despite what the paperwork says. I have recommended she have a conversation with her HCPOA to discuss her wishes and put this information in writing so it can be available for recall, if needed. All questions answered.       Bacteriuria - Primary    Urine specimen from home at 4am shows nitrites present. Urine specimen from office shows leukocytes present. We discussed these findings and recommendations. She would like to avoid antibiotics, if possible. Given that she is not having symptoms, I feel this is reasonable.  Plan: -Focus on increased water intake and consider trying AZO - If no improvement of odor, start antibiotics.       Relevant Medications   nitrofurantoin, macrocrystal-monohydrate, (MACROBID) 100 MG capsule   Other Visit Diagnoses     Bad odor of urine       Relevant Orders   POCT URINALYSIS DIP (CLINITEK) (Completed)  Time: 36 minutes, >50% spent counseling, care coordination, chart review, and documentation.    Tollie Eth, DNP, AGNP-c   History, Medications, Surgery, SDOH, and Family History reviewed and updated as appropriate.

## 2023-01-28 DIAGNOSIS — R1084 Generalized abdominal pain: Secondary | ICD-10-CM | POA: Diagnosis not present

## 2023-01-28 DIAGNOSIS — R8271 Bacteriuria: Secondary | ICD-10-CM | POA: Diagnosis not present

## 2023-02-03 ENCOUNTER — Telehealth: Payer: Self-pay | Admitting: Nurse Practitioner

## 2023-02-03 NOTE — Telephone Encounter (Signed)
Cheyenne Sanders is requesting a call from a nurse to go over her previous urine sample, she wants to know what bacteria was found. Provided call back number [phone].

## 2023-02-10 DIAGNOSIS — N281 Cyst of kidney, acquired: Secondary | ICD-10-CM | POA: Diagnosis not present

## 2023-02-10 DIAGNOSIS — N200 Calculus of kidney: Secondary | ICD-10-CM | POA: Diagnosis not present

## 2023-02-10 DIAGNOSIS — R8271 Bacteriuria: Secondary | ICD-10-CM | POA: Diagnosis not present

## 2023-02-12 ENCOUNTER — Telehealth: Payer: Self-pay | Admitting: Gastroenterology

## 2023-02-12 NOTE — Telephone Encounter (Signed)
Called and left patient a detailed vm informing her that MRI is better when looking at pancreatic cyst that she has. I provided pt with the phone number to radiology scheduling so that she can schedule MRI/MRCP at her earliest convenience.

## 2023-02-12 NOTE — Telephone Encounter (Signed)
PT has an order for an MRI and wants to know which is better, MRI or CT. Please advise.

## 2023-02-12 NOTE — Telephone Encounter (Addendum)
Pt returned call. Pt reports that she wanted to know if CT scan would show what we needed. I explained that MRCP is better for looking at pancreatic cyst. Pt mentioned that she possibly has kidney stones, and wanted to know if this would be seen on MRI. I told pt that it should, but she could always check with ordering provider. Pt will call to get MRCP set up as recommended. Pt is aware that Zofran RX was sent to her pharmacy back in May. She will also need to take Benadryl 25-50 mg 1 hour prior to MRCP. Pt verbalized understanding and had no concerns at the end of the call.

## 2023-02-14 MED ORDER — ONDANSETRON 4 MG PO TBDP: ORAL_TABLET | ORAL | 0 refills | Status: DC

## 2023-02-14 NOTE — Addendum Note (Signed)
Addended by: Missy Sabins on: 02/14/2023 11:21 AM   Modules accepted: Orders

## 2023-02-14 NOTE — Telephone Encounter (Signed)
PT returning call about the Zofran. She is requesting a call back to discuss why she can't get medication sent in as well as her issues with swallowing. Please advise.

## 2023-02-14 NOTE — Telephone Encounter (Signed)
Returned call to patient. Pt states that pharmacy did not have RX for Zofran from May. I told pt that we will send in new prescription for her. Pt has been advised that she will again take Benadryl 25-50 mg 1 hour prior to MRI and 1-2 tablets for Zofran 30 minutes prior to MRI. Pt verbalized understanding and had no concerns at the end of the call.

## 2023-02-15 ENCOUNTER — Ambulatory Visit (HOSPITAL_COMMUNITY)
Admission: RE | Admit: 2023-02-15 | Discharge: 2023-02-15 | Disposition: A | Discharge status: Home / Self Care | Payer: Medicare Other | Source: Ambulatory Visit | Attending: Gastroenterology | Admitting: Gastroenterology

## 2023-02-15 ENCOUNTER — Other Ambulatory Visit: Payer: Self-pay | Admitting: Gastroenterology

## 2023-02-15 DIAGNOSIS — K862 Cyst of pancreas: Secondary | ICD-10-CM | POA: Insufficient documentation

## 2023-02-15 DIAGNOSIS — D1803 Hemangioma of intra-abdominal structures: Secondary | ICD-10-CM | POA: Diagnosis not present

## 2023-02-15 DIAGNOSIS — K8020 Calculus of gallbladder without cholecystitis without obstruction: Secondary | ICD-10-CM | POA: Diagnosis not present

## 2023-02-15 MED ORDER — GADOBUTROL 1 MMOL/ML IV SOLN
6.0000 mL | Freq: Once | INTRAVENOUS | Status: AC | PRN
  Administered 2023-02-15: 6 mL via INTRAVENOUS

## 2023-02-17 DIAGNOSIS — M19011 Primary osteoarthritis, right shoulder: Secondary | ICD-10-CM | POA: Diagnosis not present

## 2023-02-19 ENCOUNTER — Telehealth: Payer: Self-pay

## 2023-02-19 NOTE — Telephone Encounter (Signed)
Patient reviewed MRCP results via MyChart. Pt called in with questions regarding gallstones. Pt wondered if she needed further assessment with [place]. I told pt that MRCP is able to characterize stones better than [place], no need for that at this time. I reviewed signs of gallstones with patient such as RUQ pain, nausea, vomiting after large, fatty meals. Pt is not experiencing that. Pt does report (R) abdominal pain at the level of her belly button, but she does currently have kidney stones and UTI that her urologist is treating. Pt understands that typically next step would be surgical referral to discuss possible cholecystectomy at [facility]. Pt states that she will monitor her symptoms for now. If no improvement she will let us know and we will get Dr. Lanetta Inch recommendations. Pt verbalized understanding and had no concerns at the end of the call.

## 2023-02-20 NOTE — Telephone Encounter (Signed)
Inbound call from patient requesting to speak with nurse in regards to surgical referral. Please advise.   Thank you

## 2023-02-20 NOTE — Telephone Encounter (Signed)
Pt would like referral to [facility] to discuss possible gallbladder surgery. Referral faxed to [facility], they will contact her regarding appt and she is aware.

## 2023-03-03 DIAGNOSIS — R1084 Generalized abdominal pain: Secondary | ICD-10-CM | POA: Diagnosis not present

## 2023-03-03 DIAGNOSIS — R8271 Bacteriuria: Secondary | ICD-10-CM | POA: Diagnosis not present

## 2023-03-03 DIAGNOSIS — N200 Calculus of kidney: Secondary | ICD-10-CM | POA: Diagnosis not present

## 2023-03-05 DIAGNOSIS — K8020 Calculus of gallbladder without cholecystitis without obstruction: Secondary | ICD-10-CM | POA: Diagnosis not present

## 2023-03-05 DIAGNOSIS — K862 Cyst of pancreas: Secondary | ICD-10-CM | POA: Diagnosis not present

## 2023-03-25 ENCOUNTER — Encounter: Payer: Self-pay | Admitting: Gastroenterology

## 2023-03-25 ENCOUNTER — Ambulatory Visit (INDEPENDENT_AMBULATORY_CARE_PROVIDER_SITE_OTHER): Payer: Medicare Other | Admitting: Gastroenterology

## 2023-03-25 VITALS — BP 150/80 | HR 68 | Ht 65.0 in | Wt 133.0 lb

## 2023-03-25 DIAGNOSIS — R109 Unspecified abdominal pain: Secondary | ICD-10-CM

## 2023-03-25 DIAGNOSIS — R09A2 Foreign body sensation, throat: Secondary | ICD-10-CM | POA: Diagnosis not present

## 2023-03-25 DIAGNOSIS — K8020 Calculus of gallbladder without cholecystitis without obstruction: Secondary | ICD-10-CM

## 2023-03-25 DIAGNOSIS — K862 Cyst of pancreas: Secondary | ICD-10-CM | POA: Diagnosis not present

## 2023-03-25 DIAGNOSIS — K219 Gastro-esophageal reflux disease without esophagitis: Secondary | ICD-10-CM

## 2023-03-25 MED ORDER — OMEPRAZOLE 20 MG PO CPDR: 20.0000 mg | DELAYED_RELEASE_CAPSULE | Freq: Every day | ORAL | 2 refills | Status: DC

## 2023-03-25 NOTE — Patient Instructions (Addendum)
You have been scheduled for an endoscopy. Please follow written instructions given to you at your visit today.  If you use inhalers (even only as needed), please bring them with you on the day of your procedure.  If you take any of the following medications, they will need to be adjusted prior to your procedure:   DO NOT TAKE 7 DAYS PRIOR TO TEST- Trulicity (dulaglutide) Ozempic, Wegovy (semaglutide) Mounjaro (tirzepatide) Bydureon Bcise (exanatide extended release)  DO NOT TAKE 1 DAY PRIOR TO YOUR TEST Rybelsus (semaglutide) Adlyxin (lixisenatide) Victoza (liraglutide) Byetta (exanatide) ___________________________________________________________________________   We have sent the following medications to your pharmacy for you to pick up at your convenience: Omeprazole 20 mg: Take once daily  Thank you for entrusting me with your care and for choosing [facility], Dr. Ileene Patrick     If your blood pressure at your visit was 140/90 or greater, please contact your primary care physician to follow up on this. ______________________________________________________  If you are age 70 or older, your body mass index should be between 23-30. Your Body mass index is 22.13 kg/m. If this is out of the aforementioned range listed, please consider follow up with your Primary Care Provider.  If you are age 10 or younger, your body mass index should be between 19-25. Your Body mass index is 22.13 kg/m. If this is out of the aformentioned range listed, please consider follow up with your Primary Care Provider.  ________________________________________________________  The [facility] providers would like to encourage you to use [facility] to communicate with providers for non-urgent requests or questions.  Due to long hold times on the telephone, sending your provider a message by [facility] may be a faster and more efficient way to get a response.  Please allow 48 business hours for a  response.  Please remember that this is for non-urgent requests.  _______________________________________________________  Due to recent changes in healthcare laws, you may see the results of your imaging and laboratory studies on MyChart before your provider has had a chance to review them.  We understand that in some cases there may be results that are confusing or concerning to you. Not all laboratory results come back in the same time frame and the provider may be waiting for multiple results in order to interpret others.  Please give [place] 48 hours in order for your provider to thoroughly review all the results before contacting the office for clarification of your results.

## 2023-03-25 NOTE — Progress Notes (Signed)
HPI :  72 year old female here for a follow-up visit for GERD, history of pancreatic cyst, gallstones.  She was last seen in February 2023.  At that time she was describing some epigastric pain and pyrosis.  We had placed her on a course of omeprazole and she states that worked really well to control those particular symptoms and she had been doing well for period of time since then.  She really does not have much reflux that bothers her or heartburn currently.  However she has had some globus that is been bothering her lately, perhaps for the past 6 to 12 months.  Feels that something can be stuck in her throat at times that she has a hard time clearing.  She can often feel it in the morning when she wakes up.  She tends to eat okay and denies any dysphagia that bothers her.  Denies any postnasal drip.  No prior tobacco use.  She has had some voice change with this.  She has not been on any PPI in recent months.  She is also had some occasional right sided abdominal discomfort.  This tends to be localized lateral to her umbilicus and up to her right upper quadrant.  She feels it intermittently, often in the morning and can last for a while.  She denies any clear postprandial pain that radiates to her back or shoulder.  She was seen by Dr. Janee Morn in general surgery in light of her history of gallstones.  She is also has a history of kidney stones.  She states she passed a kidney stone and it made no difference with these particular symptoms.  Her symptoms were thought to be a bit atypical for biliary colic.  She is due to see Dr. Janee Morn in follow-up.  We discussed how aggressive she wanted to be with the symptoms and how much it bothers her.  Otherwise she has been followed with serial MRCP for pancreatic cysts.  Her husband died of pancreatic cancer, otherwise no family history of pancreatic cancer in her family but she is very sensitive to this particular issue.  We have been following with  surveillance MRCP's over time.  Her last exam was performed in August 2024.  She is not to have sidebranch IPMN's, largest 1.5 cm in size.  Radiology recommended a repeat exam in 1 year.  She denies any weight loss.   Prior evaluation: Echo 08/06/19 - normal LV function, mild MR and TR   CTA 09/29/19 - no evidence of CAD    MRI abdomen 05/08/20 - IMPRESSION: 1. There are at least 3 nonenhancing liver lesions corresponding to the CT abnormality. No abnormal enhancement associated with these lesions which are favored to represent benign cysts. 2. Hepatic steatosis. 3. Multiple small scattered cysts within the pancreas. The largest is in the distal tail of pancreas measuring 1.7 cm. Interval surveillance imaging is recommended. Initial follow-up examination should be obtained in 6 months with repeat MRI without and with contrast material. This recommendation follows ACR consensus guidelines: Management of Incidental Pancreatic Cysts: A White Paper of the ACR Incidental Findings Committee. J Am Coll Radiol 2017;14:911-923. 4. Persistent right-sided hydronephrosis, hydroureter and perinephric fat stranding secondary to distal right ureteral calculus.   MRCP 12/04/20: IMPRESSION: 1. Several tiny cystic foci in the body and tail of pancreas, similar to minimally decreased in size from prior study with dominant lesion measuring 15 mm today.  2. Right hydroureteronephrosis with perinephric edema seen previously has resolved in the  interval. 3. Cholelithiasis. No biliary dilation. 4. Colonic diverticulosis.   Colonoscopy 01/09/21: The perianal and digital rectal examinations were normal. - A 5 to 6 mm polyp was found in the ascending colon. The polyp was flat. The polyp was removed with a cold snare. Resection and retrieval were complete. - Two flat polyps were found in the transverse colon. The polyps were 4 to 5 mm in size. These polyps were removed with a cold snare. Resection and  retrieval were complete. - Many medium-mouthed diverticula were found in the entire colon. Severe diverticulosis in the left colon with luminal narrowing. - The exam was otherwise without abnormality.   Surgical [P], colon, transverse, ascending, polyp (3) - SESSILE SERRATED POLYP WITHOUT CYTOLOGIC DYSPLASIA (THREE).   Repeat in 5 years recommended  MRCP 12/03/21: IMPRESSION: 1. Stable scattered cystic pancreatic lesions measuring up to 15 mm, none of which demonstrate suspicious MRI features. 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 Recommend continued follow up pre and post contrast MRI/MRCP in 1 year. This recommendation follows ACR consensus guidelines: Management of Incidental Pancreatic Cysts: A White Paper of the ACR Incidental Findings Committee. J Am Coll Radiol 2017;14:911-923. 2. Cholelithiasis without findings of acute cholecystitis. 3. Colonic diverticulosis without findings of acute diverticulitis.   MRCP 02/16/23: IMPRESSION: 1. Multiple cystic pancreatic lesions are again seen measuring up to 15 mm. No suspicious MRI features. Favored side branch IPMNs. Similar prominence of the pancreatic duct without overt dilation. Recommend follow up pre and post contrast MRI/MRCP in 1 year. 2. Cholelithiasis without findings of acute cholecystitis.   Past Medical History:  Diagnosis Date   Breast cancer [facility]) [id number]   R breast cancer x 2; lumpectomy with radiation, chemotherapy; mastectomy R.   Colon polyps    Coronary artery disease involving native coronary artery of native heart without angina pectoris 06/28/2015   Overview:  underwent cardiac cath/PTCA in 1992 at age 22   12/1990-cardiac catheterization/PTCA 08/1991-cardiac catheterization 06/01/2003-treadmill dual-isotope stress test-normal perfusion, 15.8 METs 02/28/2004-coronary artery calcium score-1342.9 02/04/2005-treadmill dual-isotope stress test-normal perfusion, 13.5 METs 01/26/2007-stress echocardiogram-normal, 13 METs  04/27/2008-treadmill dual-isotop   Disorder of bone density and structure, unspecified  11/07/2020   Dysuria 11/07/2020   Encounter for medical examination to establish care 05/29/2020   Fatigue 11/07/2020   Flank pain 09/06/2016   Hyperlipidemia    Hypertension    Increased appetite 11/07/2020   Left wrist tendonitis 12/01/2020   Mixed hyperlipidemia 06/28/2015   Nephrolithiasis    Neuromuscular disorder (HCC)    Nontraumatic tear of plantar fascia 09/02/2020   Pain in joint of right hip 03/02/2019   Personal history of chemotherapy    Personal history of radiation therapy    Plantar fasciitis 07/03/2020   Pneumonia    Precordial chest pain 06/28/2015   Skin infection 11/25/2017   Tick bite 11/25/2017   Weight gain 11/07/2020     Past Surgical History:  Procedure Laterality Date   ABDOMINAL HYSTERECTOMY     BREAST BIOPSY Left 2016   BREAST BIOPSY Left 08/28/2022   MM LT BREAST BX W LOC DEV 1ST LESION IMAGE BX SPEC STEREO GUIDE 08/28/2022 [facility]   BREAST LUMPECTOMY Right 1999   MASTECTOMY Right    2014   PARTIAL HYSTERECTOMY  1986   DUB; ovaries intact.   Family History  Problem Relation Age of Onset   Heart disease Mother    Colon cancer Maternal Aunt    Colon cancer Maternal Grandfather    Heart attack Maternal Grandfather  or ? stroke   Thyroid disease Son    Breast cancer Neg Hx    Esophageal cancer Neg Hx    Stomach cancer Neg Hx    Social History   Tobacco Use   Smoking status: Never   Smokeless tobacco: Never  Vaping Use   Vaping status: Never Used  Substance Use Topics   Alcohol use: Yes    Comment: rarely   Drug use: Never   Current Outpatient Medications  Medication Sig Dispense Refill   atorvastatin (LIPITOR) 10 MG tablet Take 1 tablet (10 mg total) by mouth daily. 90 tablet 3   Cholecalciferol (VITAMIN D3) 25 MCG (1000 UT) CAPS Take 1 capsule (1,000 Units total) by mouth daily. 90 capsule 3   metoprolol succinate (TOPROL-XL) 25 MG  24 hr tablet Take 1 tablet (25 mg total) by mouth daily. 90 tablet 3   omeprazole (PRILOSEC) 20 MG capsule Take 1 capsule (20 mg total) by mouth daily. 30 capsule 2   oxyCODONE-acetaminophen (PERCOCET/ROXICET) 5-325 MG tablet Take 1 tablet by mouth every 6 (six) hours as needed. For when she has kidney stones     tamsulosin (FLOMAX) 0.4 MG CAPS capsule Take 0.4 mg by mouth. Take as needed to flush out kidney stones     Current Facility-Administered Medications  Medication Dose Route Frequency Provider Last Rate Last Admin   0.9 %  sodium chloride infusion  500 mL Intravenous Once Emery Dupuy, Willaim Rayas, MD       Allergies  Allergen Reactions   Sublimaze [Fentanyl] Shortness Of Breath     Review of Systems: All systems reviewed and negative except where noted in HPI.   Lab Results  Component Value Date   WBC 7.2 03/27/2022   HGB 15.1 03/27/2022   HCT 45.8 03/27/2022   MCV 93 03/27/2022   PLT 284 03/27/2022    Lab Results  Component Value Date   NA 145 (H) 08/06/2022   CL 106 08/06/2022   K 4.7 08/06/2022   CO2 24 08/06/2022   BUN 20 08/06/2022   CREATININE 0.58 08/06/2022   EGFR 98 08/06/2022   CALCIUM 9.6 08/06/2022   ALBUMIN 4.4 08/06/2022   GLUCOSE 80 08/06/2022    Lab Results  Component Value Date   ALT 25 08/06/2022   AST 26 08/06/2022   ALKPHOS 74 08/06/2022   BILITOT 0.5 08/06/2022     Physical Exam: BP (!) 150/80   Pulse 68   Ht 5\' 5"  (1.651 m)   Wt 133 lb (60.3 kg)   BMI 22.13 kg/m  Constitutional: Pleasant,well-developed, female in no acute distress. Abdominal: Soft, nondistended, nontender.  There are no masses palpable.  Neurological: Alert and oriented to person place and time. Skin: Skin is warm and dry. No rashes noted. Psychiatric: Normal mood and affect. Behavior is normal.   ASSESSMENT: 72 y.o. female here for assessment of the following  1. Globus sensation   2. Gastroesophageal reflux disease, unspecified whether esophagitis present    3. Abdominal pain, unspecified abdominal location   4. Gallstones   5. Pancreatic cyst    Several months worth the of globus that has been bothering her.  Her typical reflux symptoms have not bother her much although certainly possible reflux could be causing globus.  We otherwise discussed differential diagnosis for this.  Given this issue and her intermittent abdominal pains which are less typical for biliary colic, I offered her an upper endoscopy.  We discussed risks and benefits of the procedure and anesthesia  and she wants to proceed after this discussion.  Further recommendations pending results.  In the interim, I recommend she resume omeprazole 20 mg daily for a few week trial to see if that will help resolve her globus and make her stomach feel better.  She is agreeable  Otherwise reviewed her MRCP findings.  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  Recommend continued surveillance of this with another MRCP in August 2025.  She is agreeable.  I do not think she meets any criteria that would warrant EUS at this point or surgical evaluation, especially given stability in size over time.  We otherwise discussed finding of gallstones which have been on her imaging for some time.  She has some vague right mid to upper abdominal discomfort that can come and go and not necessarily related to eating.  The symptoms are certainly not classic for biliary colic although I do not see any other concerning pathology on her exam to cause the symptoms.  It sounds like she passed a renal stone and this did not make any difference in her symptoms.  EGD can clear her upper tract as above.  If symptoms persist without any other cause she can consider cholecystectomy, however could not guarantee that this would relieve her symptoms.  She understands.  She is due to follow-up with surgery in a few  months.  PLAN: - schedule EGD at the [facility] - start omeprazole 20mg  / day until then - to treat GERD and see if it helps globus - counseled her on gallstones / biliary colic - would hold off on cholecystectomy right now until EGD done - symptoms are a bit atypical for biliary colic - counseled her on pancreatic cysts. Recommend repeat MRCP in one year from the last exam  Harlin Rain, MD [facility]

## 2023-03-28 ENCOUNTER — Encounter: Payer: Self-pay | Admitting: Gastroenterology

## 2023-03-28 ENCOUNTER — Ambulatory Visit (AMBULATORY_SURGERY_CENTER): Payer: Medicare Other | Admitting: Gastroenterology

## 2023-03-28 VITALS — BP 147/72 | HR 59 | Temp 97.1°F | Resp 11 | Ht 65.0 in | Wt 133.0 lb

## 2023-03-28 DIAGNOSIS — K449 Diaphragmatic hernia without obstruction or gangrene: Secondary | ICD-10-CM | POA: Diagnosis not present

## 2023-03-28 DIAGNOSIS — K2950 Unspecified chronic gastritis without bleeding: Secondary | ICD-10-CM | POA: Diagnosis not present

## 2023-03-28 DIAGNOSIS — R109 Unspecified abdominal pain: Secondary | ICD-10-CM | POA: Diagnosis not present

## 2023-03-28 DIAGNOSIS — R09A2 Foreign body sensation, throat: Secondary | ICD-10-CM

## 2023-03-28 MED ORDER — SODIUM CHLORIDE 0.9 % IV SOLN: 500.0000 mL | Freq: Once | INTRAVENOUS | Status: DC

## 2023-03-28 NOTE — Patient Instructions (Addendum)
Resume all of your previous medications today as ordered.   Read all of the handouts given to you by your recovery room nurse.  YOU HAD AN ENDOSCOPIC PROCEDURE TODAY AT THE [facility]:   Refer to the procedure report that was given to you for any specific questions about what was found during the examination.  If the procedure report does not answer your questions, please call your gastroenterologist to clarify.  If you requested that your care partner not be given the details of your procedure findings, then the procedure report has been included in a sealed envelope for you to review at your convenience later.  YOU SHOULD EXPECT: Some feelings of bloating in the abdomen.   Please Note:  You might notice some irritation and congestion in your nose or some drainage.  This is from the oxygen used during your procedure.  There is no need for concern and it should clear up in a day or so.  SYMPTOMS TO REPORT IMMEDIATELY:   Following upper endoscopy (EGD)  Vomiting of blood or coffee ground material  New chest pain or pain under the shoulder blades  Painful or persistently difficult swallowing  New shortness of breath  Fever of 100F or higher  Black, tarry-looking stools  For urgent or emergent issues, a gastroenterologist can be reached at any hour by calling [phone]. Do not use MyChart messaging for urgent concerns.    DIET:  We do recommend a small meal at first, but then you may proceed to your regular diet.  Drink plenty of fluids but you should avoid alcoholic beverages for 24 hours.  ACTIVITY:  You should plan to take it easy for the rest of today and you should NOT DRIVE or use heavy machinery until tomorrow (because of the sedation medicines used during the test).    FOLLOW UP: Our staff will call the number listed on your records the next business day following your procedure.  We will call around 7:15- 8:00 am to check on you and address any questions or  concerns that you may have regarding the information given to you following your procedure. If we do not reach you, we will leave a message.     If any biopsies were taken you will be contacted by phone or by letter within the next 1-3 weeks.  Please call us at [phone] if you have not heard about the biopsies in 3 weeks.    SIGNATURES/CONFIDENTIALITY: You and/or your care partner have signed paperwork which will be entered into your electronic medical record.  These signatures attest to the fact that that the information above on your After Visit Summary has been reviewed and is understood.  Full responsibility of the confidentiality of this discharge information lies with you and/or your care-partner.

## 2023-03-28 NOTE — Op Note (Signed)
[facility] Patient Name: Cheyenne Sanders Procedure Date: 03/28/2023 2:06 PM MRN: [id number] Endoscopist: Viviann Spare P. Adela Lank , MD, [id number] Age: 72 Referring MD:  Date of Birth: 07/11/1951 Gender: Female Account #: [id number] Procedure:                Upper GI endoscopy Indications:              Upper abdominal pain, Globus sensation Medicines:                Monitored Anesthesia Care Procedure:                Pre-Anesthesia Assessment:                           - Prior to the procedure, a History and Physical                            was performed, and patient medications and                            allergies were reviewed. [name] tolerance of                            previous anesthesia was also reviewed. The risks                            and benefits of the procedure and the sedation                            options and risks were discussed with the patient.                            All questions were answered, and informed consent                            was obtained. Prior Anticoagulants: The patient has                            taken no anticoagulant or antiplatelet agents. ASA                            Grade Assessment: II - A patient with mild systemic                            disease. After reviewing the risks and benefits,                            the patient was deemed in satisfactory condition to                            undergo the procedure.                           After obtaining informed consent, the endoscope was  passed under direct vision. Throughout the                            procedure, the patient's blood pressure, pulse, and                            oxygen saturations were monitored continuously. The                            Olympus Scope [id number] was introduced through the                            mouth, and advanced to the second part of duodenum.                            The  upper GI endoscopy was accomplished without                            difficulty. The patient tolerated the procedure                            well. Scope In: Scope Out: Findings:                 Esophagogastric landmarks were identified: the                            Z-line was found at 35 cm, the gastroesophageal                            junction was found at 35 cm and the upper extent of                            the gastric folds was found at 37 cm from the                            incisors.                           A 2 cm hiatal hernia was present.                           The exam of the esophagus was otherwise normal.                           Patchy mucosal changes were found in the gastric                            body / fundus. Large areas of slightly nodular /                            erythematous mucosa compared to the rest of the  mucosa. Could be benign hyperplastic / reactive                            change but biopsies were taken with a cold forceps                            for histology in multiple times.                           The exam of the stomach was otherwise normal.                           Biopsies were taken with a cold forceps for                            Helicobacter pylori testing.                           A large diverticulum was found in the second                            portion of the duodenum.                           The exam of the duodenum was otherwise normal. Complications:            No immediate complications. Estimated blood loss:                            Minimal. Estimated Blood Loss:     Estimated blood loss was minimal. Impression:               - Esophagogastric landmarks identified.                           - 2 cm hiatal hernia.                           - Normal esophagus otherwise.                           - Mucosal changes in the gastric body / fundus as                             outlined. Biopsied.                           - Normal stomach otherwise - biospies taken to rule                            out H pylori.                           - Normal examined duodenum.                           Possible GERD could  be causing globus, would still                            proceed with trial of omeprazole daily as outlined                            in the office note to see if this helps. If not and                            globus persists, consider ENT evaluation if not yet                            done.                           Otherwise will await biopsy results from the                            stomach. Incidental finding, if she has H pylori                            that could be causing symptoms. Recommendation:           - Patient has a contact number available for                            emergencies. The signs and symptoms of potential                            delayed complications were discussed with the                            patient. Return to normal activities tomorrow.                            Written discharge instructions were provided to the                            patient.                           - Resume previous diet.                           - Continue present medications.                           - Await pathology results with futher                            recommendations Viviann Spare P. Akila Batta, MD 03/28/2023 2:40:17 PM This report has been signed electronically.

## 2023-03-28 NOTE — Progress Notes (Signed)
History and Physical Interval Note: Patient seen on 03/25/23 - EGD to evaluate symptoms as outlined. No interval changes. On omeprazole 20mg . She wishes to proceed.    03/28/2023 2:11 PM  Cheyenne Sanders  has presented today for endoscopic procedure(s), with the diagnosis of  Encounter Diagnoses  Name Primary?   Globus sensation Yes   Abdominal pain, unspecified abdominal location   .  The various methods of evaluation and treatment have been discussed with the patient and/or family. After consideration of risks, benefits and other options for treatment, the patient has consented to  the endoscopic procedure(s).   [name] history has been reviewed, patient examined, no change in status, stable for surgery.  I have reviewed the patient's chart and labs.  Questions were answered to the patient's satisfaction.    Harlin Rain, MD [facility]

## 2023-03-28 NOTE — Progress Notes (Signed)
Sedate, gd SR, tolerated procedure well, VSS, report to RN 

## 2023-03-28 NOTE — Progress Notes (Signed)
Pt's states no medical or surgical changes since previsit or office visit. 

## 2023-03-28 NOTE — Progress Notes (Signed)
Called to room to assist during endoscopic procedure.  Patient ID and intended procedure confirmed with present staff. Received instructions for my participation in the procedure from the performing physician.  

## 2023-03-31 ENCOUNTER — Telehealth: Payer: Self-pay

## 2023-03-31 NOTE — Telephone Encounter (Signed)
  Follow up Call-     03/28/2023    1:42 PM 01/09/2021    1:48 PM  Call back number  Post procedure Call Back phone  # [phone]  Permission to leave phone message Yes Yes     Patient questions:  Do you have a fever, pain , or abdominal swelling? No. Pain Score  0 *  Have you tolerated food without any problems? Yes.    Have you been able to return to your normal activities? Yes.    Do you have any questions about your discharge instructions: Diet   No. Medications  No. Follow up visit  No.  Do you have questions or concerns about your Care? No.  Actions: * If pain score is 4 or above: No action needed, pain <4.

## 2023-04-10 LAB — SURGICAL PATHOLOGY

## 2023-05-08 DIAGNOSIS — M65311 Trigger thumb, right thumb: Secondary | ICD-10-CM | POA: Diagnosis not present

## 2023-05-08 DIAGNOSIS — M13841 Other specified arthritis, right hand: Secondary | ICD-10-CM | POA: Diagnosis not present

## 2023-05-14 ENCOUNTER — Telehealth: Payer: Self-pay | Admitting: Gastroenterology

## 2023-05-14 DIAGNOSIS — R09A2 Foreign body sensation, throat: Secondary | ICD-10-CM

## 2023-05-14 NOTE — Telephone Encounter (Signed)
Inbound call from patient requesting a call from nurse. Please advise.

## 2023-05-14 NOTE — Telephone Encounter (Signed)
Thanks Dottie. Agree if she has not yet seen ENT and symptoms persist despite PPI, EGD negative, ENT evaluation is reasonable

## 2023-05-14 NOTE — Telephone Encounter (Signed)
Patient called to update our office on symptoms. Despite taking omeprazole daily x 30 days, she states that she continues to have globus sensation. Would ENT referral be next appropriate step?

## 2023-05-14 NOTE — Telephone Encounter (Signed)
Referral placed to [facility]. Patient has been advised of this information and is asked to let us know if she does not hear from ENT within the next 1-2 weeks with an appointment.

## 2023-05-22 DIAGNOSIS — Z23 Encounter for immunization: Secondary | ICD-10-CM | POA: Diagnosis not present

## 2023-06-05 DIAGNOSIS — M13841 Other specified arthritis, right hand: Secondary | ICD-10-CM | POA: Diagnosis not present

## 2023-06-05 DIAGNOSIS — M65311 Trigger thumb, right thumb: Secondary | ICD-10-CM | POA: Diagnosis not present

## 2023-06-25 ENCOUNTER — Institutional Professional Consult (permissible substitution) (INDEPENDENT_AMBULATORY_CARE_PROVIDER_SITE_OTHER): Payer: Medicare Other | Admitting: Otolaryngology

## 2023-07-30 DIAGNOSIS — R0989 Other specified symptoms and signs involving the circulatory and respiratory systems: Secondary | ICD-10-CM | POA: Diagnosis not present

## 2023-07-31 ENCOUNTER — Institutional Professional Consult (permissible substitution) (INDEPENDENT_AMBULATORY_CARE_PROVIDER_SITE_OTHER): Payer: Medicare Other | Admitting: Otolaryngology

## 2023-08-07 ENCOUNTER — Ambulatory Visit: Payer: Medicare Other | Admitting: Gastroenterology

## 2023-08-28 ENCOUNTER — Telehealth: Payer: Self-pay | Admitting: Nurse Practitioner

## 2023-08-28 ENCOUNTER — Other Ambulatory Visit: Payer: Self-pay

## 2023-08-28 DIAGNOSIS — I10 Essential (primary) hypertension: Secondary | ICD-10-CM

## 2023-08-28 MED ORDER — METOPROLOL SUCCINATE ER 25 MG PO TB24: ORAL_TABLET | ORAL | 0 refills | Status: DC

## 2023-08-28 NOTE — Telephone Encounter (Signed)
Pt needs refill Metoprolol to [facility],  I scheduled her for first available CPE

## 2023-09-10 DIAGNOSIS — H903 Sensorineural hearing loss, bilateral: Secondary | ICD-10-CM | POA: Diagnosis not present

## 2023-09-22 ENCOUNTER — Telehealth: Payer: Self-pay | Admitting: Internal Medicine

## 2023-09-22 DIAGNOSIS — E538 Deficiency of other specified B group vitamins: Secondary | ICD-10-CM

## 2023-09-22 DIAGNOSIS — R7303 Prediabetes: Secondary | ICD-10-CM

## 2023-09-22 DIAGNOSIS — Z0000 Encounter for general adult medical examination without abnormal findings: Secondary | ICD-10-CM

## 2023-09-22 DIAGNOSIS — E7800 Pure hypercholesterolemia, unspecified: Secondary | ICD-10-CM

## 2023-09-22 DIAGNOSIS — E559 Vitamin D deficiency, unspecified: Secondary | ICD-10-CM

## 2023-09-22 DIAGNOSIS — I10 Essential (primary) hypertension: Secondary | ICD-10-CM

## 2023-09-22 NOTE — Telephone Encounter (Signed)
 Copied from CRM [id number]. Topic: Clinical - Request for Lab/Test Order >> Sep 19, 2023  4:49 PM Cheyenne Sanders wrote: Reason for CRM: Pt is calling to request lab orders to be placed prior to her CPE. Please advise when place

## 2023-09-24 NOTE — Addendum Note (Signed)
 Addended by: Shay Bartoli, Huntley Dec E on: 09/24/2023 05:31 PM   Modules accepted: Orders

## 2023-09-24 NOTE — Telephone Encounter (Signed)
 Future orders are in. Please let her know.

## 2023-09-25 ENCOUNTER — Encounter: Payer: Self-pay | Admitting: Nurse Practitioner

## 2023-09-25 ENCOUNTER — Ambulatory Visit: Admitting: Nurse Practitioner

## 2023-09-25 ENCOUNTER — Ambulatory Visit: Payer: Self-pay | Admitting: Nurse Practitioner

## 2023-09-25 VITALS — BP 128/82 | HR 72 | Wt 137.8 lb

## 2023-09-25 DIAGNOSIS — W57XXXA Bitten or stung by nonvenomous insect and other nonvenomous arthropods, initial encounter: Secondary | ICD-10-CM

## 2023-09-25 DIAGNOSIS — L03116 Cellulitis of left lower limb: Secondary | ICD-10-CM | POA: Diagnosis not present

## 2023-09-25 DIAGNOSIS — S0086XA Insect bite (nonvenomous) of other part of head, initial encounter: Secondary | ICD-10-CM | POA: Diagnosis not present

## 2023-09-25 HISTORY — DX: Cellulitis of left lower limb: L03.116

## 2023-09-25 MED ORDER — MUPIROCIN 2 % EX OINT
TOPICAL_OINTMENT | CUTANEOUS | 2 refills | Status: DC
Start: 2023-09-25 — End: 2024-04-19

## 2023-09-25 MED ORDER — DOXYCYCLINE HYCLATE 100 MG PO TABS: 100.0000 mg | ORAL_TABLET | Freq: Two times a day (BID) | ORAL | 0 refills | Status: DC

## 2023-09-25 NOTE — Progress Notes (Signed)
 Cheyenne Eth, DNP, AGNP-c [facility] [address] [place], [place] [address] [phone]   ACUTE VISIT- ESTABLISHED PATIENT  Blood pressure 128/82, pulse 72, weight 137 lb 12.8 oz (62.5 kg).  Subjective:  HPI Cheyenne Sanders is a 73 y.o. female presents to day for evaluation of acute concern(s).   History of Present Illness Cheyenne Sanders presents with a swollen and red left calf and ankle, suspected to be cellulitis. Pictures have been taken and added to the chart under media.   Cheyenne Sanders has a swollen and red left ankle, which she suspects might be related to a bug bite from working outside. She has been applying Neosporin to the area. Her ankle frequently swells and sometimes becomes red, although not as intensely as currently. She documented the condition with a photograph in the morning. She notes that the swelling and redness might be related to where her sock usually lays, but she has cut her socks to prevent tightness around her ankles. There is no major pain in the ankle, but it is described as 'a little sore'. The redness has slightly dissipated since the morning.   She mentions being bitten by ticks frequently, especially last year, and expresses frustration about the recurrence despite a cold winter. She did experience a tick bite to the right cheek near the eye over the weekend. Her son removed the tick. she has experienced similar issues in the past, where a streak developed around the area, but this has not occurred with this bite.   She reports a new pimple-like lesion on her nose, which she finds unusual along with the bites and stings she has been experiencing. She is unsure if there is correlation.  ROS negative except for what is listed in HPI. History, Medications, Surgery, SDOH, and Family History reviewed and updated as appropriate.  Objective:  Physical Exam Vitals and nursing note reviewed.  Constitutional:      General: She is not in acute  distress.    Appearance: Normal appearance. She is not ill-appearing.  Eyes:     Conjunctiva/sclera: Conjunctivae normal.  Pulmonary:     Effort: Pulmonary effort is normal.  Musculoskeletal:        General: Swelling and tenderness present. Normal range of motion.     Cervical back: Normal range of motion.     Left lower leg: Swelling present.       Legs:     Comments: Two areas of erythema and warmth present on the left lower extremity. Moderate tissue inflammation noted, but no pitting or diffuse edema. The larger area has a 1cm circular area of discoloration that could be a port of infection or possible bite. No clear indication of a bite on the lower area.  Skin:    General: Skin is warm and dry.     Capillary Refill: Capillary refill takes 2 to 3 seconds.     Findings: Erythema present.     Comments: Small scab present to the right cheek near the eye from tick removal. No discoloration or drainage.   Neurological:     General: No focal deficit present.     Mental Status: She is alert.     Motor: No weakness.     Coordination: Coordination normal.     Gait: Gait normal.         Assessment & Plan:   Problem List Items Addressed This Visit     Tick bite of head   No concerns present based on appearance. Photo  of tick does not appear to show engorgement. No signs of infection. Prescribing doxycycline for cellulitis, which will cover any common tick related infections in the event this is possible.      Relevant Medications   doxycycline (VIBRA-TABS) 100 MG tablet   mupirocin ointment (BACTROBAN) 2 %   Cellulitis of left lower extremity - Primary   Cellulitis of the left calf and ankle, characterized by erythema, edema, and warmth, likely secondary to a bug bite. The infection appears localized but requires antibiotic treatment due to symptom severity and risk of progression. Doxycycline is chosen for its efficacy in treating skin infections and potential tick-borne diseases,  addressing both cellulitis and possible tick-related illness. - Prescribe doxycycline for 7 days, to be taken twice daily with or without food - Advise marking the edges of the erythema and monitoring for any increase in size - Instruct to report if erythema extends beyond the marked area - Recommend applying a cool washcloth to the affected area for symptomatic relief - Suggest using acetaminophen or ibuprofen for inflammation and pain management - Send prescription for an antibacterial ointment to her pharmacy      Relevant Medications   doxycycline (VIBRA-TABS) 100 MG tablet   mupirocin ointment (BACTROBAN) 2 %      Cheyenne Eth, DNP, AGNP-c

## 2023-09-25 NOTE — Assessment & Plan Note (Signed)
 No concerns present based on appearance. Photo of tick does not appear to show engorgement. No signs of infection. Prescribing doxycycline for cellulitis, which will cover any common tick related infections in the event this is possible.

## 2023-09-25 NOTE — Patient Instructions (Signed)

## 2023-09-25 NOTE — Telephone Encounter (Signed)
 Please schedule patient for labs prior to CPE

## 2023-09-25 NOTE — Assessment & Plan Note (Signed)
 Cellulitis of the left calf and ankle, characterized by erythema, edema, and warmth, likely secondary to a bug bite. The infection appears localized but requires antibiotic treatment due to symptom severity and risk of progression. Doxycycline is chosen for its efficacy in treating skin infections and potential tick-borne diseases, addressing both cellulitis and possible tick-related illness. - Prescribe doxycycline for 7 days, to be taken twice daily with or without food - Advise marking the edges of the erythema and monitoring for any increase in size - Instruct to report if erythema extends beyond the marked area - Recommend applying a cool washcloth to the affected area for symptomatic relief - Suggest using acetaminophen or ibuprofen for inflammation and pain management - Send prescription for an antibacterial ointment to her pharmacy

## 2023-09-25 NOTE — Telephone Encounter (Signed)
  Chief Complaint: possible tick bite Symptoms: left lower leg swelling, redness Frequency: constant, noticed it 2 days ago Pertinent Negatives: Patient denies hives, worsened SOB, widespread rash, headache, fever. Disposition: [] ED /[] Urgent Care (no appt availability in office) / [x] Appointment(In office/virtual)/ []  [place] [facility]/ [] Home Care/ [] Refused Recommended Disposition /[]  Mobile Bus/ []  Follow-up with PCP Additional Notes: Patient states someone from the office already called her about scheduling her blood work and scheduled her for an acute office visit today. Patient agreeable to speak with triage RN regarding symptoms. Patient states she was unsure if she had an insect bite but yesterday when she was in her yard she had a tick on her face and her son was able to help get it off. She states she thinks it could have been a tick that bit her left leg a couple of days ago. She noticed redness to her left lower leg about 2 days ago. She states she has been placing neosporin on the area which helps.   Copied From CRM [id number]. Reason for Triage: insect bite on left leg, red and warm to the touch, discomfort- [phone]  Reason for Disposition  [1] Red streak or red line AND [2] length > 2 inches (5 cm)  Answer Assessment - Initial Assessment Questions 1. TYPE of INSECT: "What type of insect was it?"      Possibly a tick.  2. ONSET: "When did you get bitten?"      She states she is unsure. She states yesterday she was outside and had a tick on her face. She is unsure if it was tick or insect bite.  3. LOCATION: "Where is the insect bite located?"      Left leg  just above the ankle, about halfway up her left leg.  4. REDNESS: "Is the area red or pink?" If Yes, ask: "What size is area of redness?" (inches or cm). "When did the redness start?"     Red, about 3 inch circumference. She states it started Tuesday night.  5. PAIN: "Is there any pain?" If Yes, ask: "How  bad is it?"  (Scale 1-10; or mild, moderate, severe)     Yesterday she states it was painful, today it is sore to touch.  6. ITCHING: "Does it itch?" If Yes, ask: "How bad is the itch?"    - MILD: doesn't interfere with normal activities   - MODERATE-SEVERE: interferes with work, school, sleep, or other activities      Yes, this morning it was mild itching.  7. SWELLING: "How big is the swelling?" (inches, cm, or compare to coins)     She states when she presses on it, it's hard, "where I think something bit me." She states left ankle swelling is moderate.  8. OTHER SYMPTOMS: "Do you have any other symptoms?"  (e.g., difficulty breathing, hives)     Left ankle redness and swelling, SOB (she states she always has it at baseline and is not worsened).  9. PREGNANCY: "Is there any chance you are pregnant?" "When was your last menstrual period?"     N/A.  Protocols used: Insect Bite-A-AH, Tick Bite-A-AH

## 2023-09-30 ENCOUNTER — Ambulatory Visit: Payer: Self-pay | Admitting: Nurse Practitioner

## 2023-09-30 NOTE — Telephone Encounter (Signed)
  Chief Complaint: Lab/Medication question Symptoms: patient reports she is seeing improvement with antibiotic  Disposition: [] ED /[] Urgent Care (no appt availability in office) / [] Appointment(In office/virtual)/ []  [place] [facility]/ [] Home Care/ [] Refused Recommended Disposition /[] [place] [facility]/ [x]  Follow-up with PCP Additional Notes: Patient wants to know if it would be better to postpone her labs until she is off medication- Doxycycline and he foot is better- will it alter her results? Please advise.     Copied from CRM [id number]. Topic: Clinical - Lab/Test Results >> Sep 30, 2023  8:45 AM Nyra Capes wrote: Reason for CRM: Patient calling in, taking an antibiotic and needs labs done, wondering if she should have labs done while taking the antibiotic. Patient phone # [phone] patient would like to talk to the nurse regarding this issue. Reason for Disposition . [1] Caller has NON-URGENT medicine question about med that PCP prescribed AND [2] triager unable to answer question  Answer Assessment - Initial Assessment Questions 1. NAME of MEDICINE: "What medicine(s) are you calling about?"     Doxycycline- patient will take last dose on Friday  2. QUESTION: "What is your question?" (e.g., double dose of medicine, side effect)     Patient is due her full blood panel for her yearly physical and is afraid her lab will be altered by the infection she has in foot. She wants to know if she should wait 3. PRESCRIBER: "Who prescribed the medicine?" Reason: if prescribed by specialist, call should be referred to that group.     PCP  Protocols used: Medication Question Call-A-AH

## 2023-10-02 ENCOUNTER — Other Ambulatory Visit

## 2023-10-02 DIAGNOSIS — R7303 Prediabetes: Secondary | ICD-10-CM | POA: Diagnosis not present

## 2023-10-02 DIAGNOSIS — E538 Deficiency of other specified B group vitamins: Secondary | ICD-10-CM | POA: Diagnosis not present

## 2023-10-02 DIAGNOSIS — I10 Essential (primary) hypertension: Secondary | ICD-10-CM | POA: Diagnosis not present

## 2023-10-02 DIAGNOSIS — E7800 Pure hypercholesterolemia, unspecified: Secondary | ICD-10-CM | POA: Diagnosis not present

## 2023-10-02 DIAGNOSIS — E559 Vitamin D deficiency, unspecified: Secondary | ICD-10-CM | POA: Diagnosis not present

## 2023-10-02 DIAGNOSIS — Z0000 Encounter for general adult medical examination without abnormal findings: Secondary | ICD-10-CM | POA: Diagnosis not present

## 2023-10-02 LAB — LIPID PANEL

## 2023-10-03 LAB — COMPREHENSIVE METABOLIC PANEL
ALT: 29 IU/L (ref 0–32)
AST: 25 IU/L (ref 0–40)
Albumin: 4.7 g/dL (ref 3.9–4.9)
Alkaline Phosphatase: 92 IU/L (ref 44–121)
BUN/Creatinine Ratio: 33 — ABNORMAL HIGH (ref 12–28)
BUN: 22 mg/dL (ref 8–27)
Bilirubin Total: 0.6 mg/dL (ref 0.0–1.2)
CO2: 20 mmol/L (ref 20–29)
Calcium: 9.7 mg/dL (ref 8.7–10.3)
Chloride: 106 mmol/L (ref 96–106)
Creatinine, Ser: 0.66 mg/dL (ref 0.57–1.00)
Globulin, Total: 2.2 g/dL (ref 1.5–4.5)
Glucose: 68 mg/dL — ABNORMAL LOW (ref 70–99)
Potassium: 4.4 mmol/L (ref 3.5–5.2)
Sodium: 143 mmol/L (ref 134–144)
Total Protein: 6.9 g/dL (ref 6.0–8.5)
eGFR: 94 mL/min/{1.73_m2} (ref >59)

## 2023-10-03 LAB — HEMOGLOBIN A1C
Est. average glucose Bld gHb Est-mCnc: 126 mg/dL
Hgb A1c MFr Bld: 6 % — ABNORMAL HIGH (ref 4.8–5.6)

## 2023-10-03 LAB — LIPID PANEL
Cholesterol, Total: 167 mg/dL (ref 100–199)
HDL: 50 mg/dL (ref >39)
LDL CALC COMMENT:: 3.3 ratio (ref 0.0–4.4)
LDL Chol Calc (NIH): 99 mg/dL (ref 0–99)
Triglycerides: 101 mg/dL (ref 0–149)
VLDL Cholesterol Cal: 18 mg/dL (ref 5–40)

## 2023-10-03 LAB — CBC WITH DIFFERENTIAL/PLATELET
Basophils Absolute: 0.1 10*3/uL (ref 0.0–0.2)
Basos: 1 %
EOS (ABSOLUTE): 0 10*3/uL (ref 0.0–0.4)
Eos: 0 %
Hematocrit: 43.1 % (ref 34.0–46.6)
Hemoglobin: 14.7 g/dL (ref 11.1–15.9)
Immature Grans (Abs): 0 10*3/uL (ref 0.0–0.1)
Immature Granulocytes: 0 %
Lymphocytes Absolute: 2 10*3/uL (ref 0.7–3.1)
Lymphs: 29 %
MCH: 31.7 pg (ref 26.6–33.0)
MCHC: 34.1 g/dL (ref 31.5–35.7)
MCV: 93 fL (ref 79–97)
Monocytes Absolute: 0.5 10*3/uL (ref 0.1–0.9)
Monocytes: 7 %
Neutrophils Absolute: 4.2 10*3/uL (ref 1.4–7.0)
Neutrophils: 63 %
Platelets: 249 10*3/uL (ref 150–450)
RBC: 4.64 x10E6/uL (ref 3.77–5.28)
RDW: 13 % (ref 11.7–15.4)
WBC: 6.7 10*3/uL (ref 3.4–10.8)

## 2023-10-03 LAB — VITAMIN B12: Vitamin B-12: 585 pg/mL (ref 232–1245)

## 2023-10-03 LAB — TSH: TSH: 2.02 u[IU]/mL (ref 0.450–4.500)

## 2023-10-03 LAB — VITAMIN D 25 HYDROXY (VIT D DEFICIENCY, FRACTURES): Vit D, 25-Hydroxy: 32.5 ng/mL (ref 30.0–100.0)

## 2023-10-06 NOTE — Progress Notes (Signed)
 10/07/2023   Vitals:  BP 128/82   Pulse 66   Ht 5' 3.5" (1.613 m)   Wt 136 lb 6.4 oz (61.9 kg)   BMI 23.78 kg/m   Body mass index is 23.78 kg/m. Cheyenne Sanders is a 73 y.o. female who presents for Subsequent Medicare Annual Wellness Exam  Care Team Members: Current Providers as of 10/07/2023 PCP: Tollie Eth, NP Care Team Provider: Lennette Bihari, MD Encounter Provider: Tollie Eth, NP, starting on Tue Oct 07, 2023 12:00 AM Referring Provider: Tollie Eth, NP, starting on Tue Oct 07, 2023 12:00 AM Attending Provider: Tollie Eth, NP, starting on Thu Aug 28, 2023 12:07 PM (Active)   Method of visit:  in person In the event virtual visit conducted, the patient consented to a virtual visit. Patient consented to have virtual visit and was identified by two identifiers.  Encounter participants: Patient: Cheyenne Sanders - located AWV Patient Visit Location: In Office Nurse/Provider: Tollie Eth - located Virtual Visit Location Provider: Office/Clinic Others (if applicable): patient only  History of Present Illness [name] is a 73 year old female who presents for a routine follow-up visit.  She experiences intermittent chest pain of unknown origin. The pain is described as severe at times, lasting only a short period. She has a history of reflux and takes Prilosec for management. No recent hospitalizations or emergency room visits related to chest pain. No associated symptoms like sweating, jaw pain, dizziness, lightheadedness, arm or back pain,  shortness of breath, palpitations, or nausea during episodes. She does have a history of hypertension and hyperlipidemia which are both well controlled with medication.   Her leg, which was previously evaluated, has healed significantly. She denies any new concerns.   She has a history of kidney stones and remains concerned about recurrence. No recent episodes of kidney stones have occurred.  She uses a pessary  to manage bladder prolapse, which she finds effective, although it occasionally causes discomfort.  She experienced shingles approximately five years ago, with lesions on her face. She has not received the shingles vaccine due to concerns about potential side effects.  She reports occasional swelling in her ankles, which sometimes persists.  She is currently taking atorvastatin and metoprolol, and she occasionally uses meclizine for dizziness.  Review of Systems:  Neuro: Denies difficulty remembering daily tasks, people, or places.  Ear: Denies difficulty hearing or need to increase volume on television or telephone to hear Eye: Denies visual changes, difficulty reading normal print, or visual field deficits. Cardiac: Denies chest pain, palpitations, dizziness, shortness of breath, pain in lower extremities, or night time waking with shortness of breath. Lung: Denies shortness of breath, difficulty breathing, chronic cough, or dizziness.  GI: Denies changes in bowel habits, blood in stool, difficulty passing stool, decreased intake of food or drink, nausea, or vomiting.  GU: Denies changes in urinary habits, dark urine, malodorous urine, increased or decreased urination, or urinary incontinence.  MSK: Denies weakness in extremities, difficulty walking, difficulty grasping, or new MSK pain.  Skin: Denies changes to the skin, fragile skin, or increased bruising.  Constitution: Denies fatigue, weakness, or confusion.   Patient rating of health: same as this time last year  Clinical Intake: Pre-visit preparation completed: Yes  Pain : No/denies pain     BMI - recorded: 23.78 Nutritional Risks: None Diabetes: No  Activities of Daily Living: Independent Ambulation: Independent Medication Administration: Independent Home Management: Independent  Barriers to Care  Management & Learning: None  Do you feel unsafe in your current relationship?: No Do you feel physically threatened by  others?: No Anyone hurting you at home, work, or school?: No Unable to ask?: No Information provided on Community resources: No  How often do you need to have someone help you when you read instructions, pamphlets, or other written materials from your doctor or pharmacy?: 1 - Never  Interpreter Needed?: No  Information entered by :: S Raheem Kolbe     10/07/2023    2:19 PM 05/29/2020   10:57 AM 05/07/2020    9:08 PM 09/23/2019    7:54 AM  Advanced Directives  Does Patient Have a Medical Advance Directive? Yes Yes No No  Type of Estate agent of [place];Living will Healthcare Power of [place];Living will    Does patient want to make changes to medical advance directive?  No - Patient declined    Copy of Healthcare Power of Attorney in Chart? No - copy requested No - copy requested    Would patient like information on creating a medical advance directive?  No - Patient declined No - Patient declined No - Patient declined    Social Determinants of Health SDOH Screenings   Food Insecurity: No Food Insecurity (10/07/2023)  Housing: Low Risk  (10/07/2023)  Transportation Needs: No Transportation Needs (10/07/2023)  Utilities: Not At Risk (10/07/2023)  Alcohol Screen: Low Risk  (11/07/2020)  Depression (PHQ2-9): Low Risk  (10/07/2023)  Financial Resource Strain: Low Risk  (10/07/2023)  Physical Activity: Inactive (10/07/2023)  Social Connections: Moderately Isolated (10/07/2023)  Stress: No Stress Concern Present (10/07/2023)  Tobacco Use: Low Risk  (10/07/2023)     Functional Status Survey: Is the patient deaf or have difficulty hearing?: Yes (some hearing loss) Does the patient have difficulty seeing, even when wearing glasses/contacts?: No Does the patient have difficulty concentrating, remembering, or making decisions?: No Does the patient have difficulty walking or climbing stairs?: No Does the patient have difficulty dressing or bathing?: No Does the patient have  difficulty doing errands alone such as visiting a doctor's office or shopping?: No   Annual Goal:  Goals       Patient Stated (pt-stated)      Read all the way through the Bible.            Fall Risk    10/07/2023    2:17 PM 08/06/2022   11:40 AM 06/11/2022   10:09 AM 03/27/2022   10:51 AM 10/02/2021    2:44 PM  Fall Risk   Falls in the past year? 0 0 0 0 0  Number falls in past yr: 0 0 0 0 0  Injury with Fall? 0 0 0 0 0  Risk for fall due to : No Fall Risks No Fall Risks No Fall Risks No Fall Risks No Fall Risks  Follow up Falls evaluation completed Falls evaluation completed Falls evaluation completed Education provided;Falls evaluation completed Falls evaluation completed     Medicare Risk     Cognitive Function Normal: Yes Exam Completed:         Mini-Cog - 10/07/23 1418     Normal clock drawing test? yes    How many words correct? 2             Depression Screening    10/07/2023    2:18 PM 06/11/2022   10:10 AM 03/27/2022   10:51 AM 10/02/2021    2:45 PM 06/12/2021    6:06 PM  Depression  screen PHQ 2/9  Decreased Interest 0 0 0 0 0  Down, Depressed, Hopeless 0 0 0 0 0  PHQ - 2 Score 0 0 0 0 0  Altered sleeping  0 0    Tired, decreased energy  0 0    Change in appetite  0 0    Feeling bad or failure about yourself   0 0    Trouble concentrating  0 0    Moving slowly or fidgety/restless  0 0    Suicidal thoughts  0 0    PHQ-9 Score  0 0    Difficult doing work/chores  Not difficult at all Not difficult at all       Activities of Daily Living    10/07/2023    2:20 PM  In your present state of health, do you have any difficulty performing the following activities:  Hearing? 1  Comment some hearing loss  Vision? 0  Difficulty concentrating or making decisions? 0  Walking or climbing stairs? 0  Dressing or bathing? 0  Doing errands, shopping? 0  Preparing Food and eating ? N  Using the Toilet? N  In the past six months, have you  accidently leaked urine? N  Do you have problems with loss of bowel control? N  Managing your Medications? N  Managing your Finances? N  Housekeeping or managing your Housekeeping? N    Tobacco Social History   Tobacco Use  Smoking Status Never  Smokeless Tobacco Never     Counseling given: Not Answered   Hospitalizations in the Past Year: none  ED Visits in the Past Year: No  Surgeries in the Past Year: No   History    Medication List Current Meds  Medication Sig   Cholecalciferol (VITAMIN D3) 25 MCG (1000 UT) CAPS Take 1 capsule (1,000 Units total) by mouth daily.   meclizine (ANTIVERT) 25 MG tablet Take 1 tablet (25 mg total) by mouth 3 (three) times daily as needed for dizziness.   Multiple Vitamin (MULTIVITAMIN ADULT PO) Take by mouth.   mupirocin ointment (BACTROBAN) 2 % Apply to leg wound twice a day until healed   [DISCONTINUED] atorvastatin (LIPITOR) 10 MG tablet Take 1 tablet (10 mg total) by mouth daily.   [DISCONTINUED] metoprolol succinate (TOPROL-XL) 25 MG 24 hr tablet Take 1 tablet (25 mg total) by mouth daily.     Immunizations Immunization History  Administered Date(s) Administered   Fluad Trivalent(High Dose 65+) 05/22/2023   Influenza, High Dose Seasonal PF 05/08/2018, 05/22/2019   Influenza, Quadrivalent, Recombinant, Inj, Pf 05/09/2022   Influenza,inj,quad, With Preservative 04/14/2018   Influenza-Unspecified 05/13/2017, 04/14/2021   PFIZER Comirnaty(Gray Top)Covid-19 Tri-Sucrose Vaccine 11/18/2019, 12/10/2019   PFIZER(Purple Top)SARS-COV-2 Vaccination 11/18/2019, 12/10/2019   Pneumococcal Conjugate-13 02/25/2019   Pneumococcal Polysaccharide-23 04/21/2020     Screening Tests Health Maintenance  Topic Date Due   DEXA SCAN  01/30/2018   MAMMOGRAM  08/28/2023   COVID-19 Vaccine (5 - 2024-25 season) 10/23/2023 (Originally 03/16/2023)   Zoster Vaccines- Shingrix (1 of 2) 01/07/2024 (Originally 01/31/2003)   DTaP/Tdap/Td (1 - Tdap) 10/06/2024  (Originally 01/31/1972)   Medicare Annual Wellness (AWV)  10/06/2024   Colonoscopy  01/09/2026   Pneumonia Vaccine 85+ Years old  Completed   INFLUENZA VACCINE  Completed   HPV VACCINES  Aged Out   Hepatitis C Screening  Discontinued    Health Maintenance Screenings  Health Maintenance Topics with due status: Overdue     Topic Date Due   DEXA SCAN 01/30/2018  MAMMOGRAM 08/28/2023    RSV Vaccine: is due and to be scheduled by patient for later completion  Past Medical History:  Diagnosis Date   Advanced directives, counseling/discussion 05/29/2020   Anterior tibialis tendinitis of left lower extremity 06/11/2022   Arthritis    right hand   Bacteriuria 01/21/2023   Breast cancer (HCC) 1610,9604   R breast cancer x 2; lumpectomy with radiation, chemotherapy; mastectomy R.   Cellulitis of left lower extremity 09/25/2023   Colon polyps    Coronary artery disease involving native coronary artery of native heart without angina pectoris 06/28/2015   Overview:  underwent cardiac cath/PTCA in 1992 at age 30   12/1990-cardiac catheterization/PTCA 08/1991-cardiac catheterization 06/01/2003-treadmill dual-isotope stress test-normal perfusion, 15.8 METs 02/28/2004-coronary artery calcium score-1342.9 02/04/2005-treadmill dual-isotope stress test-normal perfusion, 13.5 METs 01/26/2007-stress echocardiogram-normal, 13 METs 04/27/2008-treadmill dual-isotop   Disorder of bone density and structure, unspecified  11/07/2020   Dysuria 11/07/2020   Encounter for medical examination to establish care 05/29/2020   Fatigue 11/07/2020   Flank pain 09/06/2016   Hyperlipidemia    Hypertension    Increased appetite 11/07/2020   Left wrist tendonitis 12/01/2020   Mixed hyperlipidemia 06/28/2015   Nephrolithiasis    Neuromuscular disorder (HCC)    Nontraumatic tear of plantar fascia 09/02/2020   Pain in joint of right hip 03/02/2019   Paresthesia of right foot 11/07/2020   Personal history of chemotherapy     Personal history of radiation therapy    Plantar fasciitis 07/03/2020   Pneumonia    Precordial chest pain 06/28/2015   Recurrent nephrolithiasis 08/27/2016   Right ankle pain 06/27/2021   Skin infection 11/25/2017   Tick bite 11/25/2017   Tick bite of head 11/25/2017   Tightness of heel cord, left 07/03/2020   Toenail fungus 12/29/2020   Weight gain 11/07/2020   Past Surgical History:  Procedure Laterality Date   ABDOMINAL HYSTERECTOMY     BREAST BIOPSY Left 2016   BREAST BIOPSY Left 08/28/2022   MM LT BREAST BX W LOC DEV 1ST LESION IMAGE BX SPEC STEREO GUIDE 08/28/2022 [facility]   BREAST LUMPECTOMY Right 1999   MASTECTOMY Right    2014   PARTIAL HYSTERECTOMY  1986   DUB; ovaries intact.   Family History  Problem Relation Age of Onset   Heart disease Mother    Colon cancer Maternal Aunt    Colon cancer Maternal Grandfather    Heart attack Maternal Grandfather        or ? stroke   Thyroid disease Son    Breast cancer Neg Hx    Esophageal cancer Neg Hx    Stomach cancer Neg Hx    Social History   Socioeconomic History   Marital status: Widowed    Spouse name: Not on file   Number of children: 2   Years of education: Not on file   Highest education level: Not on file  Occupational History   Occupation: retired  Tobacco Use   Smoking status: Never   Smokeless tobacco: Never  Vaping Use   Vaping status: Never Used  Substance and Sexual Activity   Alcohol use: Yes    Comment: rarely   Drug use: Never   Sexual activity: Not Currently    Partners: Male  Other Topics Concern   Not on file  Social History Narrative   Marital status: widowed x 2014; married x 11 years; not dating; not interested      Children:  2 sons; 4 grandchildren  Lives: alone; family in [place]      Employed: homemaker; previous work in [place]; Lawyer in [place].      Tobacco: none      Alcohol: rare; social      Exercise: no formal exercise.        ADLs:  independent with ADLs   Social Drivers of Health   Financial Resource Strain: Low Risk  (10/07/2023)   Overall Financial Resource Strain (CARDIA)    Difficulty of Paying Living Expenses: Not very hard  Food Insecurity: No Food Insecurity (10/07/2023)   Hunger Vital Sign    Worried About Running Out of Food in the Last Year: Never true    Ran Out of Food in the Last Year: Never true  Transportation Needs: No Transportation Needs (10/07/2023)   PRAPARE - Administrator, Civil Service (Medical): No    Lack of Transportation (Non-Medical): No  Physical Activity: Inactive (10/07/2023)   Exercise Vital Sign    Days of Exercise per Week: 0 days    Minutes of Exercise per Session: 10 min  Stress: No Stress Concern Present (10/07/2023)   [employer] of Occupational Health - Occupational Stress Questionnaire    Feeling of Stress : Only a little  Social Connections: Moderately Isolated (10/07/2023)   Social Connection and Isolation Panel [NHANES]    Frequency of Communication with Friends and Family: Twice a week    Frequency of Social Gatherings with Friends and Family: Once a week    Attends Religious Services: More than 4 times per year    Active Member of [employer] or Organizations: No    Attends Banker Meetings: Patient unable to answer    Marital Status: Widowed    Outpatient Encounter Medications as of 10/07/2023  Medication Sig   Cholecalciferol (VITAMIN D3) 25 MCG (1000 UT) CAPS Take 1 capsule (1,000 Units total) by mouth daily.   meclizine (ANTIVERT) 25 MG tablet Take 1 tablet (25 mg total) by mouth 3 (three) times daily as needed for dizziness.   Multiple Vitamin (MULTIVITAMIN ADULT PO) Take by mouth.   mupirocin ointment (BACTROBAN) 2 % Apply to leg wound twice a day until healed   [DISCONTINUED] atorvastatin (LIPITOR) 10 MG tablet Take 1 tablet (10 mg total) by mouth daily.   [DISCONTINUED] metoprolol succinate (TOPROL-XL) 25 MG 24 hr tablet Take 1 tablet  (25 mg total) by mouth daily.   atorvastatin (LIPITOR) 10 MG tablet Take 1 tablet (10 mg total) by mouth daily.   metoprolol succinate (TOPROL-XL) 25 MG 24 hr tablet Take 1 tablet (25 mg total) by mouth daily.   [DISCONTINUED] doxycycline (VIBRA-TABS) 100 MG tablet Take 1 tablet (100 mg total) by mouth 2 (two) times daily. (Patient not taking: Reported on 10/07/2023)   No facility-administered encounter medications on file as of 10/07/2023.    Physical Exam: No   PLAN  Exercise Activities and Dietary Recommendations - choose a type of activity I enjoy such as biking, gardening, team sports, walking - keep track of how long I exercise - keep track of how often I exercise - use fitness equipment - go out for a short walk before breakfast, after dinner or both - replace a coffee break with a brisk 10-minute walk; ask a friend to go with me - take the stairs instead of the elevator - use fitness equipment at home Cardiac and Carb modified diet  Fall Prevention - add more outdoor lighting - always use handrails on the  stairs - always wear shoes or slippers with non-slip sole - get at least 10 minutes of activity every day  Orders Placed This Encounter  Procedures   DG Bone Density    Standing Status:   Future    Expiration Date:   10/06/2024    Scheduling Instructions:     Please call patient to schedule    Reason for Exam (SYMPTOM  OR DIAGNOSIS REQUIRED):   screening bone density    [facility]    Release to patient:   Immediate   MM 3D SCREENING MAMMOGRAM BILATERAL BREAST    Standing Status:   Future    Expiration Date:   10/06/2024    Scheduling Instructions:     Please call patient to schedule    Reason for Exam (SYMPTOM  OR DIAGNOSIS REQUIRED):   screening mammogram    Preferred imaging location?:   [facility]    Release to patient:   Immediate     I have personally reviewed and noted the following in the patient's chart:    Medical and social history Use of alcohol, tobacco or illicit drugs  Current medications and supplements Functional ability and status Nutritional status Physical activity Advanced directives List of other physicians Hospitalizations, surgeries, and ER visits in previous 12 months Vitals Screenings to include cognitive, depression, and falls Referrals and appointments  In addition, I have reviewed and discussed with patient certain preventive protocols, quality metrics, and best practice recommendations. A written personalized care plan for preventive services as well as general preventive health recommendations were provided to patient.   Tollie Eth, NP  10/07/2023

## 2023-10-07 ENCOUNTER — Ambulatory Visit: Payer: Medicare Other | Admitting: Nurse Practitioner

## 2023-10-07 ENCOUNTER — Encounter: Payer: Self-pay | Admitting: Nurse Practitioner

## 2023-10-07 VITALS — BP 128/82 | HR 66 | Ht 63.5 in | Wt 136.4 lb

## 2023-10-07 DIAGNOSIS — Z0000 Encounter for general adult medical examination without abnormal findings: Secondary | ICD-10-CM | POA: Diagnosis not present

## 2023-10-07 DIAGNOSIS — I10 Essential (primary) hypertension: Secondary | ICD-10-CM

## 2023-10-07 DIAGNOSIS — Z853 Personal history of malignant neoplasm of breast: Secondary | ICD-10-CM | POA: Diagnosis not present

## 2023-10-07 DIAGNOSIS — Z1231 Encounter for screening mammogram for malignant neoplasm of breast: Secondary | ICD-10-CM | POA: Diagnosis not present

## 2023-10-07 DIAGNOSIS — N8110 Cystocele, unspecified: Secondary | ICD-10-CM

## 2023-10-07 DIAGNOSIS — Z13820 Encounter for screening for osteoporosis: Secondary | ICD-10-CM

## 2023-10-07 DIAGNOSIS — R0789 Other chest pain: Secondary | ICD-10-CM

## 2023-10-07 DIAGNOSIS — E7800 Pure hypercholesterolemia, unspecified: Secondary | ICD-10-CM | POA: Diagnosis not present

## 2023-10-07 DIAGNOSIS — M818 Other osteoporosis without current pathological fracture: Secondary | ICD-10-CM

## 2023-10-07 DIAGNOSIS — E559 Vitamin D deficiency, unspecified: Secondary | ICD-10-CM

## 2023-10-07 DIAGNOSIS — R7303 Prediabetes: Secondary | ICD-10-CM | POA: Diagnosis not present

## 2023-10-07 DIAGNOSIS — R42 Dizziness and giddiness: Secondary | ICD-10-CM | POA: Diagnosis not present

## 2023-10-07 MED ORDER — MECLIZINE HCL 25 MG PO TABS: 25.0000 mg | ORAL_TABLET | Freq: Three times a day (TID) | ORAL | 2 refills | Status: AC | PRN

## 2023-10-07 MED ORDER — ATORVASTATIN CALCIUM 10 MG PO TABS: 10.0000 mg | ORAL_TABLET | Freq: Every day | ORAL | 3 refills | Status: AC

## 2023-10-07 MED ORDER — METOPROLOL SUCCINATE ER 25 MG PO TB24: ORAL_TABLET | ORAL | 3 refills | Status: AC

## 2023-10-07 NOTE — Patient Instructions (Addendum)
 I have ordered your Mammogram and Bone Density test at [facility] in [place]. They will call you to schedule this.  They are located on the top floor of the large brick building (with an attached parking garage) on the corner of [address].    I do recommend you get the Shingles Vaccines. These can be given at the pharmacy at your convenience.

## 2023-10-09 ENCOUNTER — Ambulatory Visit: Payer: Medicare Other | Admitting: Nurse Practitioner

## 2023-10-12 DIAGNOSIS — R0789 Other chest pain: Secondary | ICD-10-CM | POA: Insufficient documentation

## 2023-10-12 DIAGNOSIS — R42 Dizziness and giddiness: Secondary | ICD-10-CM | POA: Insufficient documentation

## 2023-10-12 NOTE — Assessment & Plan Note (Signed)
 Chronic and well controlled at this time on metoprolol. No alarm symptoms are present. Goal BP less than 130/85.

## 2023-10-12 NOTE — Assessment & Plan Note (Signed)
 Right mastectomy due to breast cancer x2.  Mammogram ordered today No concerns today.

## 2023-10-12 NOTE — Assessment & Plan Note (Signed)

## 2023-10-12 NOTE — Assessment & Plan Note (Addendum)
 Intermittent chest pain . Differential diagnosis includes GERD, anxiety, cardiac issues, muscular, or gas. Pain improves spontaneously. Emphasized seeking immediate medical attention if pain returns and does not resolve quickly or is accompanied by symptoms. Consider antacid to see if this is helpful. No symptoms present at this time. Exam reveals no concerning findings.  - Advise use of Tums or Pepto Bismol for relief if GERD or gas-related   - Instruct to seek immediate medical attention if chest pain does not resolve in a short period or is accompanied by concerning symptoms such nausea, sweating, jaw/arm/back pain, shortness of breath, dizziness, or feeling of impending doom.

## 2023-10-12 NOTE — Assessment & Plan Note (Signed)
 In the setting of osteopenia, recommend continuation of supplement for bone health. No concerns at this time. DEXA ordered.

## 2023-10-12 NOTE — Assessment & Plan Note (Signed)
 Controlled with diet with historic good control. Healthy dietary options and moderate activity. Labs pending.

## 2023-10-12 NOTE — Assessment & Plan Note (Signed)
 Chronic. Previous labs show excellent control on atorvastatin 10mg . Repeat labs today. Continue medication.

## 2023-10-12 NOTE — Assessment & Plan Note (Signed)
 History of intermittent symptoms well controlled with meclizine. Will send refill today. No current symptoms.

## 2024-01-21 DIAGNOSIS — H11153 Pinguecula, bilateral: Secondary | ICD-10-CM | POA: Diagnosis not present

## 2024-01-21 DIAGNOSIS — H11131 Conjunctival pigmentations, right eye: Secondary | ICD-10-CM | POA: Diagnosis not present

## 2024-01-21 DIAGNOSIS — H2513 Age-related nuclear cataract, bilateral: Secondary | ICD-10-CM | POA: Diagnosis not present

## 2024-02-09 ENCOUNTER — Telehealth: Payer: Self-pay

## 2024-02-09 DIAGNOSIS — K862 Cyst of pancreas: Secondary | ICD-10-CM

## 2024-02-09 NOTE — Telephone Encounter (Signed)
 Patient scheduled for MRCP at [facility] on Monday 8-4 at 8:00am  to arr at  7:30 am.  NPO 4 hours. Called and spoke to patient.  She said she will call and reschedule since she said Monday is not good for her schedule. Patient was supplied with the phone number [phone]. All questions answered.

## 2024-02-09 NOTE — Telephone Encounter (Signed)
-----   Message from [facility] sent at 02/17/2023 10:15 AM EDT ----- Regarding: MRCP for pancreatic cyst Patient needs MRCP (1 yr F/U for pancreatic cyst) in early August

## 2024-02-16 ENCOUNTER — Ambulatory Visit (HOSPITAL_COMMUNITY)

## 2024-04-19 ENCOUNTER — Ambulatory Visit: Payer: Self-pay

## 2024-04-19 ENCOUNTER — Ambulatory Visit: Admitting: Nurse Practitioner

## 2024-04-19 ENCOUNTER — Encounter: Payer: Self-pay | Admitting: Nurse Practitioner

## 2024-04-19 VITALS — BP 132/84 | HR 60 | Wt 130.8 lb

## 2024-04-19 DIAGNOSIS — M898X1 Other specified disorders of bone, shoulder: Secondary | ICD-10-CM | POA: Insufficient documentation

## 2024-04-19 MED ORDER — METHOCARBAMOL 500 MG PO TABS: 500.0000 mg | ORAL_TABLET | Freq: Every evening | ORAL | 0 refills | Status: AC | PRN

## 2024-04-19 MED ORDER — MELOXICAM 7.5 MG PO TABS
7.5000 mg | ORAL_TABLET | Freq: Every day | ORAL | 0 refills | Status: AC
Start: 2024-04-19 — End: ?

## 2024-04-19 NOTE — Telephone Encounter (Signed)
 FYI Only or Action Required?: FYI only for provider.  Patient was last seen in primary care on 10/07/2023 by Early, [name], Cheyenne Sanders.  Called Nurse Triage reporting Pain.  Symptoms began several weeks ago.  Interventions attempted: Ice/heat application.  Symptoms are: unchanged.  Triage Disposition: See PCP When Office is Open (Within 3 Days)  Patient/caregiver understands and will follow disposition?: No, refuses disposition  Pt states she would like to see her PCP Cheyenne Doing Cheyenne Sanders. No appt availability until December. Offered appt with 2 different providers within next 3 days at [facility]. Declined at this time. Pt would like to do more research on providers before scheduling, and to see if she could be put on wait list to see PCP sooner. This RN offered to schedule with PCP and place pt on wait list. Clarified that there would be no guarantee that pt could get in sooner. Pt declined, stated she would monitor appointment availability online via MyChart. Pt states she will possibly schedule online with help of her son today, or will reach back out to schedule appt to be seen within next 3 days.  Copied from CRM [id number]. Topic: Clinical - Red Word Triage >> Apr 19, 2024  9:30 AM Cheyenne Sanders wrote: [facility] that prompted transfer to Nurse Triage: Patient states on her right side upper back is very painful,feels like near her ribs or lungs Reason for Disposition  [1] MODERATE back pain (e.g., interferes with normal activities) AND [2] present > 3 days  Answer Assessment - Initial Assessment Questions 1. ONSET: When did the pain begin? (e.g., minutes, hours, days)     2 weeks ago  2. LOCATION: Where does it hurt? (upper, mid or lower back)     Right upper back, ribcage area  3. SEVERITY: How bad is the pain?  (e.g., Scale 1-10; mild, moderate, or severe)     5/10, soreness  4. PATTERN: Is the pain constant? (e.g., yes, no; constant, intermittent)      Intermittent  5.  RADIATION: Does the pain shoot into your legs or somewhere else?     Neck  6. CAUSE:  What do you think is causing the back pain?      Possibly related to hx of breast cancer on right side, s/p lumpectomy and breast removal  7. BACK OVERUSE:  Any recent lifting of heavy objects, strenuous work or exercise?     No  8. MEDICINES: What have you taken so far for the pain? (e.g., nothing, acetaminophen , NSAIDS)     Heating pad  9. NEUROLOGIC SYMPTOMS: Do you have any weakness, numbness, or problems with bowel/bladder control?     No. Denies CP, SOB or coughing.  10. OTHER SYMPTOMS: Do you have any other symptoms? (e.g., fever, abdomen pain, burning with urination, blood in urine)       Turning makes pain worse  Protocols used: Back Pain-A-AH

## 2024-04-19 NOTE — Assessment & Plan Note (Signed)
 Right upper back musculoskeletal pain for two weeks near the right scapula. Pain is sharp, exacerbated by movements like rolling over in bed or twisting, and unrelieved by heat application. Musculoskeletal origin suspected, possibly due to muscle strain or spasm. Differential diagnosis includes gallbladder and kidney stones, but these are less likely given the pain characteristics. Concern about recurrence of breast cancer, but current symptoms do not suggest malignancy. - Prescribe meloxicam  for anti-inflammatory effect. - Provide instructions for gentle stretching exercises to alleviate muscle tension. - Advise use of heat application twice daily for 20 minutes to reduce muscle strain. - Discuss potential use of muscle relaxer at bedtime if needed, but not prescribed at this time. - Consider x-ray if no improvement in symptoms after one week to rule out other causes. - Discuss use of topical muscle rubs like Icy Hot or Biofreeze for symptomatic relief.

## 2024-04-19 NOTE — Patient Instructions (Addendum)
 I have sent in a medication called Meloxicam  for you to take once a day to help reduce inflammation.   I have sent in robaxin that you can take at bedtime as needed to help the muscle relax.   I recommend heat at least 2 times a day for about 20 minutes (you can use heat more often that that if you like).   I also recommend doing the stretches at least once a day to help with the muscle movement.   You can also use a muscle rub like Bio Freeze or Icy Hot to the area a few times a day to help with the pain.   If you are not having any improvement in the next 1-2 weeks, send me a message on MyChart and I will be happy to order an x-ray to check on the area.

## 2024-04-19 NOTE — Progress Notes (Signed)
 [name] FORBES Doing, DNP, AGNP-c [facility] [address] [place], [place] [address] [phone]   ACUTE VISIT on 04/19/2024  Blood pressure 132/84, pulse 60, weight 130 lb 12.8 oz (59.3 kg).  Subjective:  HPI  History of Present Illness Cheyenne Sanders is a 73 year old who presents with back pain.  She has been experiencing sharp pain in the upper back, specifically between the shoulder blades, for the past two weeks. The pain is primarily located on the right side of the spine and is exacerbated by movements such as rolling over in bed or twisting at the waist. Deep breaths sometimes slightly worsen the pain.   She has attempted to alleviate the pain with heat application, which did not provide relief. She has not taken any medication like ibuprofen due to her dislike of taking medicine unless necessary. She has tried using a tennis ball against the wall for relief, which was ineffective.  Her past medical history includes a lumpectomy and removal of lymph nodes in 2014 due to breast cancer. She is concerned about the pain being related to her previous cancer, especially since it is on the same side as her surgery.  She mentions a history of kidney stones and a past MRI that showed gallbladder issues, but she is unsure if these are related to her current symptoms. No significant changes in her condition over the past two years.  No burning type pain. She does not feel the pain is severe in nature, but more of an annoyance or aggravation. The pain is not moving around. There is no pain with abduction of the arms or pushing and pulling of the arms against resistance. She does have a history of bone spur on the shoulder on the right and has limited ROM related to that.  ROS negative except for what is listed in HPI. History, Medications, Surgery, SDOH, and Family History reviewed and updated as appropriate.  Objective:  Physical Exam Vitals and nursing note reviewed.   Constitutional:      General: She is not in acute distress.    Appearance: Normal appearance. She is not ill-appearing.  Eyes:     Conjunctiva/sclera: Conjunctivae normal.  Cardiovascular:     Rate and Rhythm: Normal rate and regular rhythm.     Pulses: Normal pulses.     Heart sounds: Normal heart sounds.  Pulmonary:     Effort: Pulmonary effort is normal. No respiratory distress.     Breath sounds: Normal breath sounds. No stridor. No wheezing, rhonchi or rales.  Chest:     Chest wall: No tenderness.  Abdominal:     General: Abdomen is flat. Bowel sounds are normal. There is no distension.     Palpations: Abdomen is soft. There is no mass.     Tenderness: There is no abdominal tenderness. There is no right CVA tenderness, left CVA tenderness, guarding or rebound.  Musculoskeletal:        General: Tenderness present.       Arms:  Skin:    General: Skin is warm and dry.     Capillary Refill: Capillary refill takes less than 2 seconds.  Neurological:     General: No focal deficit present.     Mental Status: She is alert and oriented to person, place, and time.     Sensory: No sensory deficit.     Motor: No weakness.     Coordination: Coordination normal.     Gait: Gait normal.  Psychiatric:  Mood and Affect: Mood normal.         Assessment & Plan:   Problem List Items Addressed This Visit     Pain of right scapula - Primary   Right upper back musculoskeletal pain for two weeks near the right scapula. Pain is sharp, exacerbated by movements like rolling over in bed or twisting, and unrelieved by heat application. Musculoskeletal origin suspected, possibly due to muscle strain or spasm. Differential diagnosis includes gallbladder and kidney stones, but these are less likely given the pain characteristics. Concern about recurrence of breast cancer, but current symptoms do not suggest malignancy. - Prescribe meloxicam  for anti-inflammatory effect. - Provide  instructions for gentle stretching exercises to alleviate muscle tension. - Advise use of heat application twice daily for 20 minutes to reduce muscle strain. - Discuss potential use of muscle relaxer at bedtime if needed, but not prescribed at this time. - Consider x-ray if no improvement in symptoms after one week to rule out other causes. - Discuss use of topical muscle rubs like Icy Hot or Biofreeze for symptomatic relief.      Relevant Medications   meloxicam  (MOBIC ) 7.5 MG tablet   methocarbamol (ROBAXIN) 500 MG tablet    [name] FORBES Doing, DNP, AGNP-c Time: 46 minutes, >50% spent counseling, care coordination, chart review, and documentation.

## 2024-04-20 DIAGNOSIS — M26622 Arthralgia of left temporomandibular joint: Secondary | ICD-10-CM | POA: Diagnosis not present

## 2024-04-20 DIAGNOSIS — H903 Sensorineural hearing loss, bilateral: Secondary | ICD-10-CM | POA: Diagnosis not present

## 2024-04-22 ENCOUNTER — Telehealth: Payer: Self-pay | Admitting: Internal Medicine

## 2024-04-22 DIAGNOSIS — R0789 Other chest pain: Secondary | ICD-10-CM

## 2024-04-22 DIAGNOSIS — M898X1 Other specified disorders of bone, shoulder: Secondary | ICD-10-CM

## 2024-04-22 DIAGNOSIS — M8580 Other specified disorders of bone density and structure, unspecified site: Secondary | ICD-10-CM

## 2024-04-22 NOTE — Telephone Encounter (Signed)
 Copied from CRM [id number]. Topic: Clinical - Request for Lab/Test Order >> Apr 22, 2024 10:53 AM Wess RAMAN wrote: Reason for CRM: Patient stated Early, [name], NP told her if she still feel like she needed an x-ray to let her know. Patient would like an x-ray order paced for her upper back and lung area.   Callback #: [id number]

## 2024-04-23 NOTE — Telephone Encounter (Signed)
 I have placed the order for her x-ray. She can go to [facility] to have this completed.

## 2024-04-29 ENCOUNTER — Ambulatory Visit
Admission: RE | Admit: 2024-04-29 | Discharge: 2024-04-29 | Disposition: A | Discharge status: Home / Self Care | Source: Ambulatory Visit | Attending: Nurse Practitioner | Admitting: Nurse Practitioner

## 2024-04-29 DIAGNOSIS — R0602 Shortness of breath: Secondary | ICD-10-CM | POA: Diagnosis not present

## 2024-05-03 ENCOUNTER — Ambulatory Visit: Payer: Self-pay | Admitting: Nurse Practitioner

## 2024-05-04 ENCOUNTER — Telehealth: Payer: Self-pay

## 2024-05-04 NOTE — Telephone Encounter (Signed)
 I called pt. Back and went over her x-ray results and recommendations.    Copied from CRM #[id number]. Topic: General - Other >> May 03, 2024  4:50 PM Jasmin G wrote: Reason for CRM: Pt called regarding recent missed call from Mr. Kaizen Ibsen D, RMA, called [facility] but he was not available. Please call pt back at [phone].

## 2024-05-18 DIAGNOSIS — Z23 Encounter for immunization: Secondary | ICD-10-CM | POA: Diagnosis not present

## 2024-05-20 ENCOUNTER — Ambulatory Visit: Payer: Self-pay | Admitting: Internal Medicine

## 2024-06-30 ENCOUNTER — Ambulatory Visit: Payer: Self-pay

## 2024-06-30 NOTE — Telephone Encounter (Signed)
 FYI Only or Action Required?: Action required by provider: request for appointment. ED advised and refused. Wants appointment.  Patient was last seen in primary care on 04/19/2024 by Early, [name], NP.  Called Nurse Triage reporting Chest Pain.  Symptoms began several days ago.  Interventions attempted: OTC medications: aspirin.  Symptoms are: unchanged.  Triage Disposition: Go to ED Now (or PCP Triage)  Patient/caregiver understands and will follow disposition?: No, wishes to speak with PCP  Copied from CRM #[id number]. Topic: Clinical - Red Word Triage >> Jun 30, 2024  9:51 AM Cheyenne Sanders wrote: Red Word that prompted transfer to Nurse Triage: Tightness in her chest the last few days. Reason for Disposition  [1] Chest pain lasts > 5 minutes AND [2] occurred in past 3 days (72 hours) (Exception: Feels exactly the same as previously diagnosed heartburn and has accompanying sour taste in mouth.)  Answer Assessment - Initial Assessment Questions Has had reflux in the past but got over it. States this doesn't feel like that but thought maybe it is back. Patient states she thinks it is anxiety. Chest Just feels tight. Stress with her children in her life. Has taken 2 aspirin in the last two days but didn't change the pain. Patient advised to go ahead and take an aspirin today. ED advised for continuous chest pain. Patient declines. Says she would just feel better if she could get in to talk to Cheyenne Doing.  Patient told ED may be able to do testing that couldn't be done in office. Calming techniques discussed with patient. 1. LOCATION: Where does it hurt?       Left to center 2. RADIATION: Does the pain go anywhere else? (e.g., into neck, jaw, arms, back)     Back is a little uncomfortable but that can be normal 3. ONSET: When did the chest pain begin? (Minutes, hours or days)      3-4 days ago 4. PATTERN: Does the pain come and go, or has it been constant since it started?  Does it get  worse with exertion?      constant 5. DURATION: How long does it last (e.g., seconds, minutes, hours)     constant 6. SEVERITY: How bad is the pain?  (e.g., Scale 1-10; mild, moderate, or severe)     Not over a 5 7. CARDIAC RISK FACTORS: Do you have any history of heart problems or risk factors for heart disease? (e.g., angina, prior heart attack; diabetes, high blood pressure, high cholesterol, smoker, or strong family history of heart disease)     Htn 8. PULMONARY RISK FACTORS: Do you have any history of lung disease?  (e.g., blood clots in lung, asthma, emphysema, birth control pills)     Denies 9. CAUSE: What do you think is causing the chest pain?     Unsure 10. OTHER SYMPTOMS: Do you have any other symptoms? (e.g., dizziness, nausea, vomiting, sweating, fever, difficulty breathing, cough)       Denies  Protocols used: Chest Pain-A-AH

## 2024-06-30 NOTE — Telephone Encounter (Signed)
 Called patient concerning her message and let her know per [name] that she can put in Urgent Pscyh referral now for her without appt or [name] can see her but she will still be doing psych referral. Pt states she doesn't think she needs psych referral and wants to see [name] first and she thinks maybe she just needs to take reflux meds again. Scheduled appt for Friday, 12/19 and she will call if any further issues

## 2024-07-02 ENCOUNTER — Ambulatory Visit: Admitting: Nurse Practitioner
